# Patient Record
Sex: Female | Born: 1965 | Race: White | Hispanic: No | Marital: Married | Smoking: Never smoker
Health system: Southern US, Community
[De-identification: ages and names within clinical notes are randomized; demographics above are authoritative.]

## PROBLEM LIST (undated history)

## (undated) DIAGNOSIS — K219 Gastro-esophageal reflux disease without esophagitis: Secondary | ICD-10-CM

## (undated) DIAGNOSIS — E739 Lactose intolerance, unspecified: Secondary | ICD-10-CM

## (undated) DIAGNOSIS — K589 Irritable bowel syndrome without diarrhea: Secondary | ICD-10-CM

## (undated) DIAGNOSIS — M1990 Unspecified osteoarthritis, unspecified site: Secondary | ICD-10-CM

## (undated) DIAGNOSIS — M2550 Pain in unspecified joint: Secondary | ICD-10-CM

## (undated) DIAGNOSIS — E785 Hyperlipidemia, unspecified: Secondary | ICD-10-CM

## (undated) DIAGNOSIS — K602 Anal fissure, unspecified: Secondary | ICD-10-CM

## (undated) DIAGNOSIS — M549 Dorsalgia, unspecified: Secondary | ICD-10-CM

## (undated) DIAGNOSIS — F419 Anxiety disorder, unspecified: Secondary | ICD-10-CM

## (undated) DIAGNOSIS — E559 Vitamin D deficiency, unspecified: Secondary | ICD-10-CM

## (undated) DIAGNOSIS — K5790 Diverticulosis of intestine, part unspecified, without perforation or abscess without bleeding: Secondary | ICD-10-CM

## (undated) DIAGNOSIS — K635 Polyp of colon: Secondary | ICD-10-CM

## (undated) DIAGNOSIS — J45909 Unspecified asthma, uncomplicated: Secondary | ICD-10-CM

## (undated) DIAGNOSIS — R600 Localized edema: Secondary | ICD-10-CM

## (undated) HISTORY — DX: Anxiety disorder, unspecified: F41.9

## (undated) HISTORY — DX: Unspecified osteoarthritis, unspecified site: M19.90

## (undated) HISTORY — DX: Pain in unspecified joint: M25.50

## (undated) HISTORY — DX: Irritable bowel syndrome, unspecified: K58.9

## (undated) HISTORY — DX: Lactose intolerance, unspecified: E73.9

## (undated) HISTORY — DX: Polyp of colon: K63.5

## (undated) HISTORY — DX: Localized edema: R60.0

## (undated) HISTORY — PX: ABDOMINAL HYSTERECTOMY: SUR658

## (undated) HISTORY — DX: Dorsalgia, unspecified: M54.9

## (undated) HISTORY — DX: Hyperlipidemia, unspecified: E78.5

## (undated) HISTORY — DX: Anal fissure, unspecified: K60.2

## (undated) HISTORY — DX: Vitamin D deficiency, unspecified: E55.9

## (undated) HISTORY — DX: Diverticulosis of intestine, part unspecified, without perforation or abscess without bleeding: K57.90

## (undated) HISTORY — DX: Unspecified asthma, uncomplicated: J45.909

## (undated) HISTORY — DX: Gastro-esophageal reflux disease without esophagitis: K21.9

---
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
## 1986-09-06 HISTORY — PX: LAPAROSCOPY: SHX197

## 1999-03-16 ENCOUNTER — Other Ambulatory Visit: Admission: RE | Admit: 1999-03-16 | Discharge: 1999-03-16 | Payer: Self-pay | Admitting: Obstetrics and Gynecology

## 1999-04-19 ENCOUNTER — Emergency Department (HOSPITAL_COMMUNITY): Admission: EM | Admit: 1999-04-19 | Discharge: 1999-04-19 | Payer: Self-pay | Admitting: *Deleted

## 2000-04-27 ENCOUNTER — Other Ambulatory Visit: Admission: RE | Admit: 2000-04-27 | Discharge: 2000-04-27 | Payer: Self-pay | Admitting: Obstetrics and Gynecology

## 2001-05-05 ENCOUNTER — Other Ambulatory Visit: Admission: RE | Admit: 2001-05-05 | Discharge: 2001-05-05 | Payer: Self-pay | Admitting: Obstetrics and Gynecology

## 2002-02-15 ENCOUNTER — Encounter (INDEPENDENT_AMBULATORY_CARE_PROVIDER_SITE_OTHER): Payer: Self-pay | Admitting: *Deleted

## 2002-02-15 ENCOUNTER — Inpatient Hospital Stay (HOSPITAL_COMMUNITY): Admission: AD | Admit: 2002-02-15 | Discharge: 2002-02-17 | Payer: Self-pay | Admitting: Obstetrics and Gynecology

## 2002-03-28 ENCOUNTER — Other Ambulatory Visit: Admission: RE | Admit: 2002-03-28 | Discharge: 2002-03-28 | Payer: Self-pay | Admitting: Obstetrics and Gynecology

## 2003-04-01 ENCOUNTER — Other Ambulatory Visit: Admission: RE | Admit: 2003-04-01 | Discharge: 2003-04-01 | Payer: Self-pay | Admitting: Obstetrics and Gynecology

## 2004-04-10 ENCOUNTER — Other Ambulatory Visit: Admission: RE | Admit: 2004-04-10 | Discharge: 2004-04-10 | Payer: Self-pay | Admitting: Obstetrics and Gynecology

## 2004-10-20 ENCOUNTER — Encounter (INDEPENDENT_AMBULATORY_CARE_PROVIDER_SITE_OTHER): Payer: Self-pay | Admitting: *Deleted

## 2004-10-20 ENCOUNTER — Ambulatory Visit (HOSPITAL_COMMUNITY): Admission: RE | Admit: 2004-10-20 | Discharge: 2004-10-20 | Payer: Self-pay | Admitting: Gastroenterology

## 2005-02-23 ENCOUNTER — Encounter: Admission: RE | Admit: 2005-02-23 | Discharge: 2005-05-24 | Payer: Self-pay | Admitting: Family Medicine

## 2005-04-22 ENCOUNTER — Other Ambulatory Visit: Admission: RE | Admit: 2005-04-22 | Discharge: 2005-04-22 | Payer: Self-pay | Admitting: Obstetrics and Gynecology

## 2005-09-06 HISTORY — PX: ABDOMINAL HYSTERECTOMY: SUR658

## 2005-11-02 ENCOUNTER — Encounter: Admission: RE | Admit: 2005-11-02 | Discharge: 2005-11-02 | Payer: Self-pay | Admitting: Gastroenterology

## 2006-05-13 ENCOUNTER — Encounter: Admission: RE | Admit: 2006-05-13 | Discharge: 2006-05-13 | Payer: Self-pay | Admitting: Gastroenterology

## 2006-07-21 ENCOUNTER — Encounter (INDEPENDENT_AMBULATORY_CARE_PROVIDER_SITE_OTHER): Payer: Self-pay | Admitting: Specialist

## 2006-07-21 ENCOUNTER — Ambulatory Visit (HOSPITAL_COMMUNITY): Admission: RE | Admit: 2006-07-21 | Discharge: 2006-07-22 | Payer: Self-pay | Admitting: Obstetrics and Gynecology

## 2007-06-15 ENCOUNTER — Encounter: Admission: RE | Admit: 2007-06-15 | Discharge: 2007-09-13 | Payer: Self-pay | Admitting: Family Medicine

## 2008-07-10 ENCOUNTER — Encounter: Admission: RE | Admit: 2008-07-10 | Discharge: 2008-07-10 | Payer: Self-pay | Admitting: Obstetrics and Gynecology

## 2008-11-27 ENCOUNTER — Encounter: Admission: RE | Admit: 2008-11-27 | Discharge: 2008-11-27 | Payer: Self-pay | Admitting: Family Medicine

## 2009-06-09 ENCOUNTER — Other Ambulatory Visit: Admission: RE | Admit: 2009-06-09 | Discharge: 2009-06-09 | Payer: Self-pay | Admitting: Obstetrics and Gynecology

## 2009-07-29 ENCOUNTER — Encounter: Admission: RE | Admit: 2009-07-29 | Discharge: 2009-07-29 | Payer: Self-pay | Admitting: Obstetrics and Gynecology

## 2010-07-07 ENCOUNTER — Encounter: Admission: RE | Admit: 2010-07-07 | Discharge: 2010-07-07 | Payer: Self-pay | Admitting: Obstetrics and Gynecology

## 2011-01-22 NOTE — Discharge Summary (Signed)
NAME:  Kim Clements, Kim Clements           ACCOUNT NO.:  [id number]   MEDICAL RECORD NO.:  [id number]          PATIENT TYPE:  OIB   LOCATION:  9317                          FACILITY:  [facility]   PHYSICIAN:  Guy Sandifer. Henderson Cloud, M.D. DATE OF BIRTH:  06-01-1966   DATE OF ADMISSION:  07/21/2006  DATE OF DISCHARGE:  07/22/2006                                 DISCHARGE SUMMARY   ADMITTING DIAGNOSES:  1. Endometriosis.  2. Menorrhagia.   DISCHARGE DIAGNOSES:  1. Endometriosis.  2. Menorrhagia.   PROCEDURE:  On July 21, 2006, laparoscopically-assisted vaginal  hysterectomy with bilateral salpingo-oophorectomy.   REASON FOR ADMISSION:  This patient is a 45 year old, married, white female,  G1, P1 with increasingly heavy and painful menses.  After discussing the  options, she is admitted for laparoscopically-assisted vaginal hysterectomy  with bilateral salpingo-oophorectomy.   HOSPITAL COURSE:  The patient undergoes the above procedure with an  estimated blood loss of 150 mL.  On the evening of surgery, she has good  pain relief.  She is not yet passing flatus.  Vital signs are stable.  She  is afebrile with a clear urine output.  On the day of discharge, she is  passing flatus, tolerating a regular diet.  Hemoglobin is 11.4, white count  is 10.1 and pathology is pending.  Foley catheter is out.  She has not yet  passed urine at the time of dictation.  She will void prior to discharge.   CONDITION ON DISCHARGE:  Good.   DIET:  Regular as tolerated.   ACTIVITY:  No lifting.  No operation of automobiles.  No vaginal entry.   SPECIAL INSTRUCTIONS:  She is to call the office for problems including, but  not limited to temperature 101 degrees, heavy vaginal bleeding, persistent  nausea, vomiting or increasing pain.   DISCHARGE MEDICATIONS:  1. Percocet 5/325 mg, #40, one to two p.o. q.6 h. p.r.n.  2. Ibuprofen 600 mg q.6 h. p.r.n.  3. Multivitamin daily.  4. She will resume her preop  medications with the exception of no birth      control pills.   FOLLOW UP:  Followup is in the office in 2 weeks.      Guy Sandifer Henderson Cloud, M.D.  Electronically Signed     JET/MEDQ  D:  07/22/2006  T:  07/22/2006  Job:  [id number]

## 2011-01-22 NOTE — H&P (Signed)
NAME:  Kim Clements, MAULT           ACCOUNT NO.:  [id number]   MEDICAL RECORD NO.:  [id number]          PATIENT TYPE:  AMB   LOCATION:  SDC                           [facility]ILITY:  [facility]   PHYSICIAN:  Guy Sandifer. Henderson Cloud, M.D. DATE OF BIRTH:  21-Jan-1966   DATE OF ADMISSION:  07/21/2006  DATE OF DISCHARGE:                                HISTORY & PHYSICAL   CHIEF COMPLAINT:  Heavy painful menses.   HISTORY OF PRESENT ILLNESS:  This patient is a 45 year old married white  female, G1 P1, who has increasingly heavy and painful menses.  She has  increasing clotting.  She has known irritable bowel syndrome which gets  markedly worse before each menstrual cycle.  After careful discussion of the  options, she is being admitted for laparoscopically assisted vaginal  hysterectomy with bilateral salpingo-oophorectomy.  The risks and  complications of surgery have been carefully discussed preoperatively.  Issues of menopause with risks and benefits of treatment have also been  carefully reviewed.   PAST MEDICAL HISTORY:  1. Irritable bowel syndrome.  2. Benign hematuria with negative urologic workup.  3. Diverticulosis with history of diverticulitis.  4. History of esophageal reflux.  5. Hiatal hernia.  6. History of migraine headaches.   PAST SURGICAL HISTORY:  1. Fractured right foot, 2000.  2. Tonsillectomy, 1970.  3. Laparoscopy, 1988.   MEDICATIONS:  1. Birth control pill daily.  2. Librax1.  Fractured right foot, 2000.  3. Tonsillectomy, 1970.  4. Laparoscopy, 1988.   MEDICATIONS:  1. Birth control pill daily.  2. Librax once a day.  3. Colestid 1 gram a day.  4. Prilosec one a day.  5. Clarinex.  6. Lexapro 20 mg a day.  7. Allergy shots twice a week.   ALLERGIES:  1. PENICILLIN.  2. SULFA.  3. ERYTHROMYCIN, ALL LEADING TO HIVES.   FAMILY HISTORY:  Heart disease in mother, maternal grandmother, paternal  grandfather, maternal grandfather.  Rheumatoid arthritis in mother.   Breast  cancer paternal grandmother.  CVA in paternal grandfather, paternal  grandmother.  Headache in mother and maternal grandmother.  Hemoglobinopathy  in maternal grandmother.  Type 2 diabetes maternal grandmother.  Thyroid  dysfunction maternal grandmother.   OBSTETRIC HISTORY:  Vaginal delivery x1.   SOCIAL HISTORY:  Denies tobacco, alcohol, or drug abuse.   REVIEW OF SYSTEMS:  NEUROLOGIC:  Denies headache.  CARDIAC:  Denies chest  pain.  PULMONARY:  Denies shortness of breath.  GI:  Denies recent changes  in bowel habits.   PHYSICAL EXAMINATION:  VITAL SIGNS:  Height 5 feet 5.5 inches, weight 141.5  pounds, blood pressure 110/70.  HEENT:  Without thyromegaly.  LUNGS:  Clear to auscultation.  HEART:  Regular rate and rhythm.  BACK:  Without CVA tenderness.  BREASTS:  Without masses, tracks. or discharge.  ABDOMEN:  Soft, nontender, without masses.  PELVIC:  Vulva, vagina, cervix without lesion.  Uterus retroverted, mildly  tender, mobile, adnexa nontender without masses.  EXTREMITIES:  Grossly within normal limits.  NEUROLOGIC:  Grossly within normal limits.   ASSESSMENT:  History of endometriosis with increasingly painful heavy  menses.  PLAN:  Laparoscopically-assisted vaginal hysterectomy with bilateral  salpingo-oophorectomy.      Guy Sandifer Henderson Cloud, M.D.  Electronically Signed     JET/MEDQ  D:  07/20/2006  T:  07/20/2006  Job:  [id number]

## 2011-01-22 NOTE — Discharge Summary (Signed)
NAME:  FAIZA, BANSAL           ACCOUNT NO.:  [id number]   MEDICAL RECORD NO.:  [id number]          PATIENT TYPE:  OIB   LOCATION:  9317                          [facility]ILITY[facility]   PHYSICIAN:  Guy Sandifer. Henderson Cloud, M.D. DATE OF BIRTH:  Mar 17, 1966   DATE OF ADMISSION:  07/21/2006  DATE OF DISCHARGE:                                 DISCHARGE SUMMARY   Audio too short to transcribe (less than 5 seconds)      Guy Sandifer. Henderson Cloud, M.D.     JET/MEDQ  D:  07/22/2006  T:  07/22/2006  Job:  [id number]

## 2011-01-22 NOTE — Op Note (Signed)
NAME:  Kim Clements, Kim Clements           ACCOUNT NO.:  [id number]   MEDICAL RECORD NO.:  [id number]          PATIENT TYPE:  AMB   LOCATION:  SDC                           [facility]   PHYSICIAN:  Guy Sandifer. Henderson Cloud, M.D. DATE OF BIRTH:  29-Mar-1966   DATE OF PROCEDURE:  07/21/2006  DATE OF DISCHARGE:                                 OPERATIVE REPORT   PREOPERATIVE DIAGNOSES:  1. Endometriosis.  2. Menorrhagia.   POSTOPERATIVE DIAGNOSES:  1. Endometriosis.  2. Menorrhagia.   PROCEDURE:  Laparoscopically assisted vaginal hysterectomy with bilateral  salpingo-oophorectomy.   SURGEON:  Harold Hedge, M.D.   ASSISTANT:  Zelphia Cairo, M.D.   ANESTHESIA:  General with endotracheal intubation.   SPECIMEN:  Uterus, bilateral tubes and ovaries to Pathology.   ESTIMATED BLOOD LOSS:  150 mL.   INDICATIONS AND CONSENT:  This patient is a 45 year old married white  female, G1, P1, with known endometriosis.  She has increasingly heavy and  painful menses.  Details are dictated in the history and physical.  Laparoscopically assisted vaginal hysterectomy with bilateral salpingo-  oophorectomy is discussed preoperatively.  Potential risks and complications  are discussed preoperatively including, but limited to, infection, bowel,  bladder or ureteral damage, bleeding requiring transfusion of blood products  with possible transfusion reaction, HIV and hepatitis acquisition, DVT, PE,  pneumonia, laparotomy, fistula formation and postoperative dyspareunia.  Issues of the menopause, risks and benefits of possible treatment of  menopausal symptoms were also reviewed preoperatively.  All questions were  answered and consent signed and on the chart.   FINDINGS:  Upper abdomen is grossly normal.  Uterus is normal in size and  contour.  There are normal bilateral tubes and ovaries.   PROCEDURE:  The patient is taken to operating room where she is identified,  placed dorsal supine position and  general anesthesia is induced via  endotracheal intubation.  She is then placed in the dorsal lithotomy  position, where she is prepped abdominally and vaginally and bladder  straight-catheterized.  Hulka tenaculum is placed in the uterus as a  manipulator and she is draped in a sterile fashion.  The infraumbilical and  suprapubic areas are infiltrated with 0.5% plain Marcaine in the midline.  A  small infraumbilical incision is made.  A disposable Veress needle is placed  on the first attempt without difficulty.  Normal syringe and drop test are  noted.  Two liters of gas are insufflated under low pressure with good  tympany in the right upper quadrant.  Veress needle is removed and a 10/11  bladeless XL disposable trocar sleeve is placed under direct visualization  using the diagnostic laparoscope.  After placement, the operative  laparoscope is placed.  A small suprapubic incision is made and a 5-mm  disposable bladeless XL trocar sleeve is placed under direct visualization  without difficulty.  The above findings are noted.  Then using the Gyrus  bipolar cautery cutting instrument, the right infundibulopelvic ligament is  taken down, which is carried across the mesosalpinx, across the round  ligament, down to the level of the vesicouterine peritoneum.  A similar  procedure  is carried out on the left side as well.  This is done well clear  of the ureters bilaterally.  The vesicouterine peritoneum is taken down  cephalolaterally.  Good hemostasis is noted all around.  Suprapubic trocar  sleeve is removed, pneumoperitoneum is reduced, instruments are removed and  attention is turned to the vagina.  Posterior cul-de-sac is entered sharply.  Cervix was circumscribed with a cautery.  Mucosa is then entered sharply and  bluntly.  Then using the Gyrus bipolar cautery, the uterosacral ligaments  are taken down followed by the bladder pillars, cardinal ligaments and the  uterine vessels  bilaterally.  Fundus with tubes and ovaries is delivered  posteriorly.  The remaining pedicles are taken down and the specimen is  delivered.  Uterosacral ligaments are plicated to the vaginal cuff  bilaterally with separate sutures of 0 Monocryl.  All suture will be 0  Monocryl was otherwise designated.  Sutures are placed at the 3 and 9  o'clock position to assure hemostasis as well.  Uterosacral ligaments are  plicated in the midline with a 3rd suture.  Cuff is closed with figure-of-  eights.  Foley catheter is placed in the bladder and clear urine is noted.  Attention is returned to the abdomen.  Careful inspection and copious  irrigation reveal excellent hemostasis.  Inspection under reduced  pneumoperitoneum confirms good hemostasis.  Instruments are removed,  pneumoperitoneum is reduced and the umbilical trocar sleeve is also removed.  All counts are correct.  The patient is awakened and taken to recovery room  in stable condition.      Guy Sandifer Henderson Cloud, M.D.  Electronically Signed     JET/MEDQ  D:  07/21/2006  T:  07/21/2006  Job:  [id number]

## 2011-01-22 NOTE — Op Note (Signed)
NAME:  RYNN, MARKIEWICZ           ACCOUNT NO.:  [id number]   MEDICAL RECORD NO.:  [id number]          PATIENT TYPE:  AMB   LOCATION:  ENDO                         FACILITY:  [facility]   PHYSICIAN:  Petra Kuba, M.D.    DATE OF BIRTH:  1965-10-13   DATE OF PROCEDURE:  10/20/2004  DATE OF DISCHARGE:                                 OPERATIVE REPORT   PROCEDURE:  Colonoscopy and biopsy.   INDICATIONS:  The patient with diarrhea.  Consent was signed after risks,  benefits, methods, and options were thoroughly discussed in the office.   PREMEDICATIONS:  Demerol 90 mg, Versed 9 mg.   DESCRIPTION OF PROCEDURE:  Rectal inspection pertinent for external  hemorrhoids, small.  Digital exam was negative.  Video pediatric adjustable  colonoscope was inserted and easily advanced around the colon to the cecum.  This did require some abdominal pressure but no position changes.  No  abnormality was seen on insertion.  The cecum was identified by the  appendiceal orifice and the ileocecal valve.  The scope was inserted a short  ways into the terminal ileum which was normal.  Photo documentation was  obtained.  The scope was slowly withdrawn.  A few random terminal ileum  biopsies were obtained and put in the first container.  The scope was slowly  withdrawn. Prep in the colon was adequate.  There was some liquid stool that  required washing and suctioning.  On slow withdrawal through the colon,  random biopsies were obtained and put in the second container.  There were a  few tiny, little bumps in the transverse.  I doubt they were polyps, but  they were cold biopsied as well as and put in the same container.  The scope  was slowly withdrawn. There was a rare left-sided diverticula but no other  abnormalities.  As we slowly withdrew back to the rectum, anal area was  pulled through retroflexed and revealed some small hemorrhoids.  The scope  was straightened and readvanced a short ways up the left side  of the colon.  Air was suctioned and the scope removed.  The patient tolerated the  procedure well.  There were no obvious immediate complications.   ENDOSCOPIC DIAGNOSES:  1.  Small internal and external hemorrhoids.  2.  Rare left-sided diverticula.  3.  Two tiny bumps in the transverse, doubt polyps, status post biopsy.  4.  Otherwise within normal limits to the terminal ileum, status post random      biopsies throughout.   PLAN:  Await pathology to rule out any microscopic abnormalities.  If  symptoms continue, happy to see back or consider upper GI/small bowel follow-  through.  Trial of irritable bowel medicines as needed.      MEM/MEDQ  D:  10/20/2004  T:  10/20/2004  Job:  [id number]   cc:   Chales Salmon. Abigail Miyamoto, M.D.  [address]  [place]  [place] [address]  Fax: [phone]

## 2011-07-21 ENCOUNTER — Other Ambulatory Visit: Payer: Self-pay | Admitting: Obstetrics and Gynecology

## 2011-07-21 DIAGNOSIS — Z1231 Encounter for screening mammogram for malignant neoplasm of breast: Secondary | ICD-10-CM

## 2011-08-10 ENCOUNTER — Ambulatory Visit
Admission: RE | Admit: 2011-08-10 | Discharge: 2011-08-10 | Disposition: A | Discharge status: Home / Self Care | Payer: Managed Care, Other (non HMO) | Source: Ambulatory Visit | Attending: Obstetrics and Gynecology | Admitting: Obstetrics and Gynecology

## 2011-08-10 DIAGNOSIS — Z1231 Encounter for screening mammogram for malignant neoplasm of breast: Secondary | ICD-10-CM

## 2012-07-27 ENCOUNTER — Other Ambulatory Visit: Payer: Self-pay | Admitting: Obstetrics and Gynecology

## 2012-07-27 DIAGNOSIS — Z1231 Encounter for screening mammogram for malignant neoplasm of breast: Secondary | ICD-10-CM

## 2012-09-07 ENCOUNTER — Ambulatory Visit: Payer: Managed Care, Other (non HMO)

## 2012-10-02 ENCOUNTER — Ambulatory Visit: Payer: Managed Care, Other (non HMO)

## 2012-10-19 ENCOUNTER — Ambulatory Visit: Payer: Managed Care, Other (non HMO)

## 2012-11-07 ENCOUNTER — Ambulatory Visit: Payer: Managed Care, Other (non HMO)

## 2012-11-13 ENCOUNTER — Other Ambulatory Visit: Payer: Self-pay

## 2012-12-06 ENCOUNTER — Ambulatory Visit
Admission: RE | Admit: 2012-12-06 | Discharge: 2012-12-06 | Disposition: A | Discharge status: Home / Self Care | Payer: 59 | Source: Ambulatory Visit | Attending: Obstetrics and Gynecology | Admitting: Obstetrics and Gynecology

## 2012-12-06 DIAGNOSIS — Z1231 Encounter for screening mammogram for malignant neoplasm of breast: Secondary | ICD-10-CM

## 2013-01-12 ENCOUNTER — Ambulatory Visit
Admission: RE | Admit: 2013-01-12 | Discharge: 2013-01-12 | Disposition: A | Discharge status: Home / Self Care | Payer: 59 | Source: Ambulatory Visit | Attending: Allergy and Immunology | Admitting: Allergy and Immunology

## 2013-01-12 ENCOUNTER — Other Ambulatory Visit: Payer: Self-pay | Admitting: Allergy and Immunology

## 2013-01-12 DIAGNOSIS — R05 Cough: Secondary | ICD-10-CM

## 2013-01-12 DIAGNOSIS — R059 Cough, unspecified: Secondary | ICD-10-CM

## 2013-12-03 ENCOUNTER — Other Ambulatory Visit: Payer: Self-pay

## 2013-12-03 DIAGNOSIS — Z1231 Encounter for screening mammogram for malignant neoplasm of breast: Secondary | ICD-10-CM

## 2013-12-13 ENCOUNTER — Ambulatory Visit
Admission: RE | Admit: 2013-12-13 | Discharge: 2013-12-13 | Disposition: A | Discharge status: Home / Self Care | Payer: 59 | Source: Ambulatory Visit

## 2013-12-13 DIAGNOSIS — Z1231 Encounter for screening mammogram for malignant neoplasm of breast: Secondary | ICD-10-CM

## 2015-04-03 ENCOUNTER — Other Ambulatory Visit: Payer: Self-pay | Admitting: Obstetrics and Gynecology

## 2015-04-03 LAB — HM PAP SMEAR: HM PAP: NORMAL

## 2015-04-03 LAB — HM MAMMOGRAPHY

## 2015-04-04 LAB — CYTOLOGY - PAP

## 2015-06-11 ENCOUNTER — Telehealth: Payer: Self-pay | Admitting: Gastroenterology

## 2015-06-11 NOTE — Telephone Encounter (Signed)
Received records from [facility] and placed on Dr. Elana Alm desk. Patient is requesting a female provider.

## 2015-06-20 ENCOUNTER — Encounter: Payer: Self-pay | Admitting: Gastroenterology

## 2015-06-20 NOTE — Telephone Encounter (Signed)
Dr. Lavon PaganiniNandigam reviewed records and approved. Informed patient that we can schedule colonoscopy but patient is requesting OV first. Patient states that she will callback to schedule.

## 2015-08-22 ENCOUNTER — Encounter: Payer: Self-pay | Admitting: Gastroenterology

## 2015-08-22 ENCOUNTER — Ambulatory Visit (INDEPENDENT_AMBULATORY_CARE_PROVIDER_SITE_OTHER): Payer: 59 | Admitting: Gastroenterology

## 2015-08-22 VITALS — BP 90/68 | HR 88 | Ht 61.5 in | Wt 171.5 lb

## 2015-08-22 DIAGNOSIS — K589 Irritable bowel syndrome without diarrhea: Secondary | ICD-10-CM | POA: Diagnosis not present

## 2015-08-22 DIAGNOSIS — R197 Diarrhea, unspecified: Secondary | ICD-10-CM | POA: Diagnosis not present

## 2015-08-22 MED ORDER — ELUXADOLINE 100 MG PO TABS: 100.0000 mg | ORAL_TABLET | Freq: Two times a day (BID) | ORAL | Status: DC

## 2015-08-22 NOTE — Patient Instructions (Signed)
Please call the office to schedule a pre-visit colonoscopy appointment. [phone]  We have sent the following medications to your pharmacy for you to pick up at your convenience: Viberzi 100 mg one by mouth twice a day.

## 2015-08-22 NOTE — Progress Notes (Signed)
Kim RidgeCatherine F Clements    [id number]    20-Nov-1965  Primary Care Physician:MEYERS, Jeannett SeniorSTEPHEN, MD  Referring Physician: No referring provider defined for this encounter.  Chief complaint: IBS-diarrhea  HPI: 49 year old female with history of irritable bowel syndrome predominant diarrhea diagnosed about 11 years ago, was followed by Dr. Ewing SchleinMagod, here to establish care. Patient said she has tried low Fodmap diet in the past, with no improvement in symptoms. Her symptoms are currently maintained with Zoloft, BuSpar, Colestid, Pepto-Bismol, and when necessary Imodium. She continues to have intermittent episodes and relates them to her stress more than dietary changes. Denies any nausea, vomiting, melena or bright red blood per rectum. She has had 2 colonoscopies and upper endoscopy unrevealing for any significant pathology. She also had abdominal ultrasound and CT abdomen and pelvis with no significant pathology as well.      Outpatient Encounter Prescriptions as of 08/22/2015  Medication Sig  . AMBULATORY NON FORMULARY MEDICATION Allergy Shots 2 shots per week  . amitriptyline (ELAVIL) 10 MG tablet Take 10 mg by mouth at bedtime.  . bismuth subsalicylate (PEPTO BISMOL) 262 MG/15ML suspension Take 30 mLs by mouth as needed.  . busPIRone (BUSPAR) 15 MG tablet Take 15 mg by mouth 2 (two) times daily.  . clidinium-chlordiazePOXIDE (LIBRAX) 5-2.5 MG capsule Take 1 capsule by mouth 4 (four) times daily -  before meals and at bedtime. Pt to titrate as needed  . colestipol (COLESTID) 1 G tablet Take 1-2 g by mouth daily.  . fexofenadine (ALLEGRA) 180 MG tablet Take 180 mg by mouth daily.  . Loperamide HCl (IMODIUM A-D PO) Take by mouth as needed.  . sertraline (ZOLOFT) 50 MG tablet Take 50 mg by mouth daily.  . Simethicone (GAS-X PO) Take by mouth as needed.   No facility-administered encounter medications on file as of 08/22/2015.    Allergies as of 08/22/2015 - Review Complete  08/22/2015  Allergen Reaction Noted  . Erythromycin  08/22/2015  . Penicillins  08/22/2015  . Sulfa antibiotics  08/22/2015    Past Medical History  Diagnosis Date  . Anal fissure   . Anxiety   . Arthritis   . Asthma   . Colon polyps   . Diverticulosis   . GERD (gastroesophageal reflux disease)   . HLD (hyperlipidemia)   . IBS (irritable bowel syndrome)     Past Surgical History  Procedure Laterality Date  . Abdominal hysterectomy    . Tonsillectomy  1971  . Laparoscopy  1988    with Laser for endometriosis    Family History  Problem Relation Age of Onset  . Breast cancer Paternal Grandmother     all of paternal females  . Breast cancer Paternal Aunt   . Prostate cancer Paternal Grandfather   . Colon polyps Mother   . Colon polyps Father   . Heart disease Mother   . Heart disease Maternal Grandfather   . Heart disease Maternal Grandmother   . Heart disease Paternal Grandfather   . Stroke Father   . Rheum arthritis Mother   . Osteoarthritis Mother     Social History   Social History  . Marital Status: Married    Spouse Name: N/A  . Number of Children: 1  . 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 Smoking status: Never Smoker   . Smokeless tobacco: Never Used  .  Alcohol Use: No  . Drug Use: No  . Sexual Activity: Not on file   Other Topics Concern  . Not on file   Social History Narrative  . No narrative on file      Review of systems: Review of Systems  Constitutional: Negative for fever and chills.  HENT: Negative.   Eyes: Negative for blurred vision.  Respiratory: Negative for cough, shortness of breath and wheezing.   Cardiovascular: Negative for chest pain and palpitations.  Gastrointestinal: as per HPI Genitourinary: Negative for dysuria, urgency, frequency and hematuria.  Musculoskeletal: Negative for myalgias, back pain and joint pain.  Skin: Negative for itching and rash.   Neurological: Negative for dizziness, tremors, focal weakness, seizures and loss of consciousness.  Endo/Heme/Allergies: Negative for environmental allergies.  Psychiatric/Behavioral: Negative for depression, suicidal ideas and hallucinations.  All other systems reviewed and are negative.   Physical Exam: Filed Vitals:   08/22/15 1012  BP: 90/68  Pulse: 88   Gen:      No acute distress HEENT:  EOMI, sclera anicteric Neck:     No masses; no thyromegaly Lungs:    Clear to auscultation bilaterally; normal respiratory effort CV:         Regular rate and rhythm; no murmurs Abd:      + bowel sounds; soft, non-tender; no palpable masses, no distension Ext:    No edema; adequate peripheral perfusion Skin:      Warm and dry; no rash Neuro: alert and oriented x 3 Psych: normal mood and affect  Data Reviewed:  Reviewed her chart and prior GI workup as per history of present illness   Assessment and Plan/Recommendations:  49 year old female with history of irritable bowel syndrome predominant diarrhea is here to establish care Patient's symptoms are currently stable with only infrequent episodes We'll do a trial of Viberzi 100 mg twice daily Continue Colestid, Zoloft, Buspar and PRN Imodium Patient is not interested in doing a trial of low fodmap diet again Due for colonoscopy in 2017 Return in 3-6 months  K. Scherry Ran , MD [phone] Mon-Fri 8a-5p [phone] after 5p, weekends, holidays

## 2015-10-29 ENCOUNTER — Encounter: Payer: Self-pay | Admitting: Gastroenterology

## 2016-01-14 ENCOUNTER — Other Ambulatory Visit: Payer: Self-pay | Admitting: Gastroenterology

## 2016-01-15 ENCOUNTER — Other Ambulatory Visit: Payer: Self-pay | Admitting: Gastroenterology

## 2016-01-16 ENCOUNTER — Other Ambulatory Visit: Payer: Self-pay | Admitting: Gastroenterology

## 2016-02-05 ENCOUNTER — Ambulatory Visit (AMBULATORY_SURGERY_CENTER): Payer: Self-pay

## 2016-02-05 VITALS — Ht 61.0 in | Wt 178.0 lb

## 2016-02-05 DIAGNOSIS — Z860100 Personal history of colon polyps, unspecified: Secondary | ICD-10-CM

## 2016-02-05 MED ORDER — SUPREP BOWEL PREP KIT 17.5-3.13-1.6 GM/177ML PO SOLN: 1.0000 | Freq: Once | ORAL | Status: DC

## 2016-02-05 NOTE — Progress Notes (Signed)
No allergies to eggs or soy No past problems with anesthesia EXCEPT EASILY OVERSEDATED WITH GENERAL ANESTHESIA AND CONS SEDATION No diet meds No home oxygen  Has email and internet; declined emmi

## 2016-02-10 ENCOUNTER — Telehealth: Payer: Self-pay | Admitting: Gastroenterology

## 2016-02-10 NOTE — Telephone Encounter (Signed)
Patient also has questions regarding prep.

## 2016-02-10 NOTE — Telephone Encounter (Signed)
Pt states she spoke with another PV nurse  Hilda LiasMarie PV

## 2016-02-10 NOTE — Telephone Encounter (Signed)
Spoke with pt and answered questions regarding prep and her medications.She will call back if she has any further questions.

## 2016-02-11 ENCOUNTER — Telehealth: Payer: Self-pay | Admitting: Gastroenterology

## 2016-02-11 NOTE — Telephone Encounter (Signed)
I recommended that pt use something like Desitin or Vaseline instead of Cortaid.

## 2016-02-12 ENCOUNTER — Ambulatory Visit (AMBULATORY_SURGERY_CENTER): Payer: 59 | Admitting: Gastroenterology

## 2016-02-12 ENCOUNTER — Encounter: Payer: Self-pay | Admitting: Gastroenterology

## 2016-02-12 VITALS — BP 145/82 | HR 83 | Temp 98.9°F | Resp 12 | Ht 61.0 in | Wt 178.0 lb

## 2016-02-12 DIAGNOSIS — Z86010 Personal history of colonic polyps: Secondary | ICD-10-CM | POA: Diagnosis not present

## 2016-02-12 LAB — HM COLONOSCOPY

## 2016-02-12 MED ORDER — SODIUM CHLORIDE 0.9 % IV SOLN: 500.0000 mL | INTRAVENOUS | Status: DC

## 2016-02-12 NOTE — Patient Instructions (Signed)
Impression/recommendations:  Diverticulosis (handout given) High Fiber Diet (handout given) Hemorrhoids (handout given)  YOU HAD AN ENDOSCOPIC PROCEDURE TODAY AT THE [facility]:   Refer to the procedure report that was given to you for any specific questions about what was found during the examination.  If the procedure report does not answer your questions, please call your gastroenterologist to clarify.  If you requested that your care partner not be given the details of your procedure findings, then the procedure report has been included in a sealed envelope for you to review at your convenience later.  YOU SHOULD EXPECT: Some feelings of bloating in the abdomen. Passage of more gas than usual.  Walking can help get rid of the air that was put into your GI tract during the procedure and reduce the bloating. If you had a lower endoscopy (such as a colonoscopy or flexible sigmoidoscopy) you may notice spotting of blood in your stool or on the toilet paper. If you underwent a bowel prep for your procedure, you may not have a normal bowel movement for a few days.  Please Note:  You might notice some irritation and congestion in your nose or some drainage.  This is from the oxygen used during your procedure.  There is no need for concern and it should clear up in a day or so.  SYMPTOMS TO REPORT IMMEDIATELY:   Following lower endoscopy (colonoscopy or flexible sigmoidoscopy):  Excessive amounts of blood in the stool  Significant tenderness or worsening of abdominal pains  Swelling of the abdomen that is new, acute  Fever of 100F or higher   For urgent or emergent issues, a gastroenterologist can be reached at any hour by calling [phone].   DIET: Your first meal following the procedure should be a small meal and then it is ok to progress to your normal diet. Heavy or fried foods are harder to digest and may make you feel nauseous or bloated.  Likewise, meals heavy in dairy  and vegetables can increase bloating.  Drink plenty of fluids but you should avoid alcoholic beverages for 24 hours.  ACTIVITY:  You should plan to take it easy for the rest of today and you should NOT DRIVE or use heavy machinery until tomorrow (because of the sedation medicines used during the test).    FOLLOW UP: Our staff will call the number listed on your records the next business day following your procedure to check on you and address any questions or concerns that you may have regarding the information given to you following your procedure. If we do not reach you, we will leave a message.  However, if you are feeling well and you are not experiencing any problems, there is no need to return our call.  We will assume that you have returned to your regular daily activities without incident.  If any biopsies were taken you will be contacted by phone or by letter within the next 1-3 weeks.  Please call us at [phone] if you have not heard about the biopsies in 3 weeks.    SIGNATURES/CONFIDENTIALITY: You and/or your care partner have signed paperwork which will be entered into your electronic medical record.  These signatures attest to the fact that that the information above on your After Visit Summary has been reviewed and is understood.  Full responsibility of the confidentiality of this discharge information lies with you and/or your care-partner.

## 2016-02-12 NOTE — Op Note (Signed)
[facility] Patient Name: Kim Clements Procedure Date: 02/12/2016 1:20 PM MRN: [id number] Endoscopist: Napoleon Form , MD Age: 50 Referring MD:  Date of Birth: April 23, 1966 Gender: Female Procedure:                Colonoscopy Indications:              Screening for malignant neoplasm in the colon Medicines:                Monitored Anesthesia Care Procedure:                Pre-Anesthesia Assessment:                           - Prior to the procedure, a History and Physical                            was performed, and patient medications and                            allergies were reviewed. The patient's tolerance of                            previous anesthesia was also reviewed. The risks                            and benefits of the procedure and the sedation                            options and risks were discussed with the patient.                            All questions were answered, and informed consent                            was obtained. Prior Anticoagulants: The patient has                            taken no previous anticoagulant or antiplatelet                            agents. ASA Grade Assessment: II - A patient with                            mild systemic disease. After reviewing the risks                            and benefits, the patient was deemed in                            satisfactory condition to undergo the procedure.                           After obtaining informed consent, the colonoscope  was passed under direct vision. Throughout the                            procedure, the patient's blood pressure, pulse, and                            oxygen saturations were monitored continuously. The                            Model CF-HQ190L [id number]) scope was introduced                            through the anus and advanced to the the cecum,                            identified by appendiceal  orifice and ileocecal                            valve. The colonoscopy was performed without                            difficulty. The patient tolerated the procedure                            well. The quality of the bowel preparation was                            adequate. The ileocecal valve, appendiceal orifice,                            and rectum were photographed. Scope In: 2:21:36 PM Scope Out: 2:38:14 PM Scope Withdrawal Time: 0 hours 13 minutes 59 seconds  Total Procedure Duration: 0 hours 16 minutes 38 seconds  Findings:                 The perianal and digital rectal examinations were                            normal.                           A few small and large-mouthed diverticula were                            found in the sigmoid colon.                           Non-bleeding internal hemorrhoids were found during                            retroflexion. The hemorrhoids were small.                           The exam was otherwise without abnormality. Complications:            No immediate complications. Estimated Blood  Loss:     Estimated blood loss was minimal. Impression:               - Diverticulosis in the sigmoid colon.                           - Non-bleeding internal hemorrhoids.                           - The examination was otherwise normal.                           - No specimens collected. Recommendation:           - Patient has a contact number available for                            emergencies. The signs and symptoms of potential                            delayed complications were discussed with the                            patient. Return to normal activities tomorrow.                            Written discharge instructions were provided to the                            patient.                           - Resume previous diet.                           - Continue present medications.                           - Repeat colonoscopy in 10  years for screening                            purposes.                           - Return to GI clinic PRN. [name], MD 02/12/2016 2:44:21 PM This report has been signed electronically.

## 2016-02-12 NOTE — Progress Notes (Signed)
To PACU- Awake and Alert, Report to RN

## 2016-02-13 ENCOUNTER — Telehealth: Payer: Self-pay | Admitting: *Deleted

## 2016-02-13 NOTE — Telephone Encounter (Signed)
No answer, left message to call if questions or concerns. 

## 2016-04-09 ENCOUNTER — Telehealth: Payer: Self-pay | Admitting: Gastroenterology

## 2016-04-09 MED ORDER — COLESTIPOL HCL 1 G PO TABS: 1.0000 g | ORAL_TABLET | Freq: Every day | ORAL | 3 refills | Status: DC

## 2016-04-09 MED ORDER — ELUXADOLINE 100 MG PO TABS: 1.0000 | ORAL_TABLET | Freq: Two times a day (BID) | ORAL | 3 refills | Status: DC

## 2016-04-09 MED ORDER — CILIDINIUM-CHLORDIAZEPOXIDE 2.5-5 MG PO CAPS: 1.0000 | ORAL_CAPSULE | Freq: Three times a day (TID) | ORAL | 0 refills | Status: DC

## 2016-04-09 NOTE — Telephone Encounter (Signed)
meds sent electronically.

## 2016-07-28 ENCOUNTER — Telehealth: Payer: Self-pay | Admitting: *Deleted

## 2016-07-28 MED ORDER — COLESTIPOL HCL 1 G PO TABS: 1.0000 g | ORAL_TABLET | Freq: Every day | ORAL | 3 refills | Status: DC

## 2016-07-28 NOTE — Telephone Encounter (Signed)
Fax request from pharmacy  Sent in electronically

## 2016-08-01 ENCOUNTER — Other Ambulatory Visit: Payer: Self-pay | Admitting: Gastroenterology

## 2016-08-02 NOTE — Telephone Encounter (Signed)
Is Librax ok to refill for patient

## 2016-08-04 ENCOUNTER — Telehealth: Payer: Self-pay | Admitting: Gastroenterology

## 2016-08-04 NOTE — Telephone Encounter (Signed)
Patient is wanting a refill of Librax. Is it ok to refill Thanks

## 2016-08-04 NOTE — Telephone Encounter (Signed)
Ok to refill Librax, is she taking Viberzi and is it effective?

## 2016-08-05 NOTE — Telephone Encounter (Signed)
Ok thanks 

## 2016-08-05 NOTE — Telephone Encounter (Signed)
Yes she is taking the Viberzi and it has been helping  Prescription called into pharmacy of Librax 90 day supply as previously sent in Aug

## 2016-10-06 ENCOUNTER — Telehealth: Payer: Self-pay | Admitting: Gastroenterology

## 2016-10-06 NOTE — Telephone Encounter (Signed)
Ok to send refill for Viberzi and it is ok to continue probiotics and multivitamins. Please schedule patient for follow up visit. Thanks

## 2016-10-06 NOTE — Telephone Encounter (Signed)
Dr Lavon PaganiniNandigam patient wants 90 day supply of Viberizi has not been seen since 2016. Also wants to know if she can take probiotics and vitamins with this medication  Is it ok to send her a 90 day supply

## 2016-10-07 MED ORDER — ELUXADOLINE 100 MG PO TABS: 1.0000 | ORAL_TABLET | Freq: Two times a day (BID) | ORAL | 3 refills | Status: DC

## 2016-10-07 NOTE — Addendum Note (Signed)
Addended by: Marlowe KaysSTALLINGS, Miqueas Whilden M on: 10/07/2016 01:12 PM   Modules accepted: Orders

## 2016-10-07 NOTE — Telephone Encounter (Signed)
Called patient to inform viberizi sent to pharmacy and for patient to make a follow up appointment, Explained to pt she will need a follow up in the office before any further refills She said she will call back to schedule follow up

## 2016-11-02 ENCOUNTER — Telehealth: Payer: Self-pay | Admitting: Emergency Medicine

## 2016-11-02 NOTE — Telephone Encounter (Signed)
Pre-visit attempted no answer. Voicemail left for pt to return call to office

## 2016-11-05 ENCOUNTER — Ambulatory Visit: Payer: 59 | Admitting: Family Medicine

## 2016-11-05 LAB — HM MAMMOGRAPHY

## 2016-12-06 ENCOUNTER — Telehealth: Payer: Self-pay | Admitting: Gastroenterology

## 2016-12-06 NOTE — Telephone Encounter (Signed)
Please advise patient to do GI pathogen panel to exclude infectious etiology. Maintain hydration with increased fluid intake. Continue Viberzi.

## 2016-12-06 NOTE — Telephone Encounter (Signed)
Patient has an appointment with you this month for follow up and medication check. Last seen 2016.  She calls today with complaints of 24 hours of intermittent stomach pains. She has developed diarrhea today. More than 4 stools today. She has no appetite. Afebrile and no nausea or vomiting. She admits to over eating the evening before she developed her symptoms. She calls also because she takes Viberzi. She has her gallbladder. She is eating crackers and drinking fluids. Please advise.

## 2016-12-07 NOTE — Telephone Encounter (Signed)
Spoke with the patient. She is feeling better today. No abdominal pain. Slept through the night. She has not had any loose bowel movements today. She will resume her Viberzi.Kim Clements to call back if her symptoms return.

## 2016-12-09 ENCOUNTER — Ambulatory Visit (INDEPENDENT_AMBULATORY_CARE_PROVIDER_SITE_OTHER): Payer: 59 | Admitting: Family Medicine

## 2016-12-09 ENCOUNTER — Encounter: Payer: Self-pay | Admitting: Family Medicine

## 2016-12-09 VITALS — BP 112/74 | HR 87 | Temp 98.1°F | Ht 61.5 in | Wt 176.8 lb

## 2016-12-09 DIAGNOSIS — K580 Irritable bowel syndrome with diarrhea: Secondary | ICD-10-CM | POA: Diagnosis not present

## 2016-12-09 DIAGNOSIS — F411 Generalized anxiety disorder: Secondary | ICD-10-CM | POA: Diagnosis not present

## 2016-12-09 DIAGNOSIS — F339 Major depressive disorder, recurrent, unspecified: Secondary | ICD-10-CM | POA: Diagnosis not present

## 2016-12-09 DIAGNOSIS — E559 Vitamin D deficiency, unspecified: Secondary | ICD-10-CM

## 2016-12-09 DIAGNOSIS — E669 Obesity, unspecified: Secondary | ICD-10-CM | POA: Diagnosis not present

## 2016-12-09 DIAGNOSIS — R7989 Other specified abnormal findings of blood chemistry: Secondary | ICD-10-CM

## 2016-12-09 DIAGNOSIS — E66811 Obesity, class 1: Secondary | ICD-10-CM

## 2016-12-09 DIAGNOSIS — R5383 Other fatigue: Secondary | ICD-10-CM | POA: Diagnosis not present

## 2016-12-09 DIAGNOSIS — Z13220 Encounter for screening for lipoid disorders: Secondary | ICD-10-CM | POA: Diagnosis not present

## 2016-12-09 LAB — LIPID PANEL
Cholesterol: 196 mg/dL (ref 0–200)
HDL: 51.3 mg/dL (ref >39.00)
LDL Cholesterol: 124 mg/dL — ABNORMAL HIGH (ref 0–99)
NonHDL: 144.42
Total CHOL/HDL Ratio: 4
Triglycerides: 102 mg/dL (ref 0.0–149.0)
VLDL: 20.4 mg/dL (ref 0.0–40.0)

## 2016-12-09 LAB — CBC WITH DIFFERENTIAL/PLATELET
Basophils Absolute: 0 10*3/uL (ref 0.0–0.1)
Basophils Relative: 0.4 % (ref 0.0–3.0)
Eosinophils Absolute: 0.1 10*3/uL (ref 0.0–0.7)
Eosinophils Relative: 1.4 % (ref 0.0–5.0)
HCT: 39.9 % (ref 36.0–46.0)
Hemoglobin: 13.5 g/dL (ref 12.0–15.0)
Lymphocytes Relative: 29.2 % (ref 12.0–46.0)
Lymphs Abs: 1.9 10*3/uL (ref 0.7–4.0)
MCHC: 33.9 g/dL (ref 30.0–36.0)
MCV: 88.5 fl (ref 78.0–100.0)
Monocytes Absolute: 0.4 10*3/uL (ref 0.1–1.0)
Monocytes Relative: 6.1 % (ref 3.0–12.0)
Neutro Abs: 4.1 10*3/uL (ref 1.4–7.7)
Neutrophils Relative %: 62.9 % (ref 43.0–77.0)
Platelets: 276 10*3/uL (ref 150.0–400.0)
RBC: 4.51 Mil/uL (ref 3.87–5.11)
RDW: 13.4 % (ref 11.5–15.5)
WBC: 6.5 10*3/uL (ref 4.0–10.5)

## 2016-12-09 LAB — COMPREHENSIVE METABOLIC PANEL
ALT: 29 U/L (ref 0–35)
AST: 21 U/L (ref 0–37)
Albumin: 4.3 g/dL (ref 3.5–5.2)
Alkaline Phosphatase: 66 U/L (ref 39–117)
BUN: 14 mg/dL (ref 6–23)
CO2: 29 mEq/L (ref 19–32)
Calcium: 9.4 mg/dL (ref 8.4–10.5)
Chloride: 105 mEq/L (ref 96–112)
Creatinine, Ser: 0.88 mg/dL (ref 0.40–1.20)
GFR: 71.92 mL/min (ref >60.00)
Glucose, Bld: 106 mg/dL — ABNORMAL HIGH (ref 70–99)
Potassium: 3.9 mEq/L (ref 3.5–5.1)
Sodium: 139 mEq/L (ref 135–145)
Total Bilirubin: 0.5 mg/dL (ref 0.2–1.2)
Total Protein: 6.9 g/dL (ref 6.0–8.3)

## 2016-12-09 LAB — TSH: TSH: 1.35 u[IU]/mL (ref 0.35–4.50)

## 2016-12-09 LAB — VITAMIN B12: Vitamin B-12: 443 pg/mL (ref 211–911)

## 2016-12-09 LAB — VITAMIN D 25 HYDROXY (VIT D DEFICIENCY, FRACTURES): VITD: 26 ng/mL — ABNORMAL LOW (ref 30.00–100.00)

## 2016-12-09 NOTE — Patient Instructions (Signed)
Increase your Zoloft to 100 mg over the next two weeks.

## 2016-12-09 NOTE — Progress Notes (Signed)
Kim Clements is a 51 y.o. female is here to [facility].   History of Present Illness:   Kim Clements CMA acting as scribe for Dr. Earlene Plater.  HPI:  1. GAD (generalized anxiety disorder). Patient is here for evaluation of anxiety.  She has the following anxiety symptoms: fatigue, insomnia, racing thoughts. Onset of symptoms was approximately several months ago.  Symptoms have been unchanged since that time. She denies current suicidal and homicidal ideation. Family history significant for depression.Possible organic causes contributing are: none. Risk factors: none Previous treatment includes medication BuSpar and Zoloft.   She complains of the following medication side effects: none. No SI/HI.    2. Irritable bowel syndrome with diarrhea. Followed by GI. Generally controlled using medications. Anxiety makes worse.     3. Obesity (BMI 30.0-34.9). Not exercising or watching diet. At highest adult weight.    Health Maintenance Due  Topic Date Due  . TETANUS/TDAP  08/30/1984  . MAMMOGRAM  12/14/2015   PMHx, SurgHx, SocialHx, Medications, and Allergies were reviewed in the Visit Navigator and updated as appropriate.   Past Medical History:  Diagnosis Date  . Anal fissure   . Anxiety   . Arthritis   . Asthma   . Colon polyps   . Diverticulosis   . GERD (gastroesophageal reflux disease)   . HLD (hyperlipidemia)   . IBS (irritable bowel syndrome)    Past Surgical History:  Procedure Laterality Date  . ABDOMINAL HYSTERECTOMY    . LAPAROSCOPY  1988   Laser. Indication: Endometriosis.  . TONSILLECTOMY  1971   Family History  Problem Relation Age of Onset  . Breast cancer Paternal Grandmother     all of paternal females  . Breast cancer Paternal Aunt   . Prostate cancer Paternal Grandfather   . Heart disease Paternal Grandfather   . Colon polyps Mother   . Heart disease Mother   . Rheum arthritis Mother   . Osteoarthritis Mother   . Colon polyps Father   .  Stroke Father   . Heart disease Maternal Grandfather   . Heart disease Maternal Grandmother   . Colon cancer Neg Hx    Social History  Substance Use Topics  . Smoking status: Never Smoker  . Smokeless tobacco: Never Used  . Alcohol use No   Current Medications and Allergies:   .  albuterol (PROVENTIL HFA;VENTOLIN HFA) 108 (90 Base) MCG/ACT inhaler, Inhale into the lungs every 6 (six) hours as needed for wheezing or shortness of breath., Disp: , Rfl:  .  AMBULATORY NON FORMULARY MEDICATION, Allergy Shots 2 shots per week, Disp: , Rfl:  .  amitriptyline (ELAVIL) 10 MG tablet, Take 10 mg by mouth at bedtime., Disp: , Rfl:  .  bismuth subsalicylate (PEPTO BISMOL) 262 MG/15ML suspension, Take 30 mLs by mouth as needed., Disp: , Rfl:  .  busPIRone (BUSPAR) 15 MG tablet, Take 15 mg by mouth 2 (two) times daily., Disp: , Rfl:  .  calcium carbonate (TUMS - DOSED IN MG ELEMENTAL CALCIUM) 500 MG chewable tablet, Chew 1 tablet by mouth daily., Disp: , Rfl:  .  clidinium-chlordiazePOXIDE (LIBRAX) 5-2.5 MG capsule, TAKE ONE CAPSULE BY MOUTH 4 TIMES A DAY BEFORE MEALS AND AT BEDTIME (TITRATE AS NEEDED), Disp: 360 capsule, Rfl: 0 .  colestipol (COLESTID) 1 g tablet, Take 1-2 tablets (1-2 g total) by mouth daily., Disp: 90 tablet, Rfl: 3 .  Eluxadoline (VIBERZI) 100 MG TABS, Take 1 tablet by mouth 2 (two) times daily.,  Disp: 90 tablet, Rfl: 3 .  fexofenadine (ALLEGRA) 180 MG tablet, Take 180 mg by mouth daily., Disp: , Rfl:  .  hydrOXYzine (ATARAX/VISTARIL) 25 MG tablet, Take 50 mg by mouth 3 (three) times daily as needed., Disp: , Rfl:  .  Loperamide HCl (IMODIUM A-D PO), Take by mouth as needed., Disp: , Rfl:  .  sertraline (ZOLOFT) 50 MG tablet, Take 50 mg by mouth daily., Disp: , Rfl:    Allergies  Allergen Reactions  . Erythromycin   . Penicillins   . Sulfa Antibiotics      Review of Systems:   Review of Systems  Constitutional: Positive for malaise/fatigue. Negative for chills and fever.    HENT: Negative for congestion, ear pain, sinus pain and sore throat.   Eyes: Negative for blurred vision and double vision.  Respiratory: Negative for cough, shortness of breath and wheezing.   Cardiovascular: Negative for chest pain, palpitations and leg swelling.  Gastrointestinal: Positive for abdominal pain and diarrhea.  Genitourinary: Negative for dysuria.  Musculoskeletal: Negative for back pain and neck pain.  Neurological: Negative for dizziness and headaches.  Psychiatric/Behavioral: Positive for depression. Negative for hallucinations, memory loss, substance abuse and suicidal ideas. The patient is nervous/anxious.     Vitals:   Vitals:   12/09/16 1404  BP: 112/74  Pulse: 87  Temp: 98.1 F (36.7 C)  TempSrc: Oral  SpO2: 97%  Weight: 176 lb 12.8 oz (80.2 kg)  Height: 5' 1.5" (1.562 m)     Body mass index is 32.87 kg/m.   Physical Exam:   Physical Exam  Constitutional: She is oriented to person, place, and time. She appears well-developed and well-nourished. No distress.  HENT:  Head: Normocephalic and atraumatic.  Right Ear: External ear normal.  Left Ear: External ear normal.  Nose: Nose normal.  Mouth/Throat: Oropharynx is clear and moist.  Eyes: Conjunctivae and EOM are normal. Pupils are equal, round, and reactive to light.  Neck: Normal range of motion. Neck supple. No thyromegaly present.  Cardiovascular: Normal rate, regular rhythm, normal heart sounds and intact distal pulses.   Pulmonary/Chest: Effort normal.  Abdominal: Soft.  Musculoskeletal: Normal range of motion.  Neurological: She is alert and oriented to person, place, and time.  Skin: Skin is warm.  Psychiatric: She has a normal mood and affect. Her behavior is normal.  Nursing note and vitals reviewed.  Results for orders placed or performed in visit on 12/09/16  CBC with Differential/Platelet  Result Value Ref Range   WBC 6.5 4.0 - 10.5 K/uL   RBC 4.51 3.87 - 5.11 Mil/uL    Hemoglobin 13.5 12.0 - 15.0 g/dL   HCT 96.0 45.4 - 09.8 %   MCV 88.5 78.0 - 100.0 fl   MCHC 33.9 30.0 - 36.0 g/dL   RDW 11.9 14.7 - 82.9 %   Platelets 276.0 150.0 - 400.0 K/uL   Neutrophils Relative % 62.9 43.0 - 77.0 %   Lymphocytes Relative 29.2 12.0 - 46.0 %   Monocytes Relative 6.1 3.0 - 12.0 %   Eosinophils Relative 1.4 0.0 - 5.0 %   Basophils Relative 0.4 0.0 - 3.0 %   Neutro Abs 4.1 1.4 - 7.7 K/uL   Lymphs Abs 1.9 0.7 - 4.0 K/uL   Monocytes Absolute 0.4 0.1 - 1.0 K/uL   Eosinophils Absolute 0.1 0.0 - 0.7 K/uL   Basophils Absolute 0.0 0.0 - 0.1 K/uL  Comprehensive metabolic panel  Result Value Ref Range   Sodium 139 135 -  145 mEq/L   Potassium 3.9 3.5 - 5.1 mEq/L   Chloride 105 96 - 112 mEq/L   CO2 29 19 - 32 mEq/L   Glucose, Bld 106 (H) 70 - 99 mg/dL   BUN 14 6 - 23 mg/dL   Creatinine, Ser 1.61 0.40 - 1.20 mg/dL   Total Bilirubin 0.5 0.2 - 1.2 mg/dL   Alkaline Phosphatase 66 39 - 117 U/L   AST 21 0 - 37 U/L   ALT 29 0 - 35 U/L   Total Protein 6.9 6.0 - 8.3 g/dL   Albumin 4.3 3.5 - 5.2 g/dL   Calcium 9.4 8.4 - 09.6 mg/dL   GFR 04.54 >09.81 mL/min  Lipid panel  Result Value Ref Range   Cholesterol 196 0 - 200 mg/dL   Triglycerides 191.4 0.0 - 149.0 mg/dL   HDL 78.29 >56.21 mg/dL   VLDL 30.8 0.0 - 65.7 mg/dL   LDL Cholesterol 846 (H) 0 - 99 mg/dL   Total CHOL/HDL Ratio 4    NonHDL 144.42   Vitamin B12  Result Value Ref Range   Vitamin B-12 443 211 - 911 pg/mL  VITAMIN D 25 Hydroxy (Vit-D Deficiency, Fractures)  Result Value Ref Range   VITD 26.00 (L) 30.00 - 100.00 ng/mL  TSH  Result Value Ref Range   TSH 1.35 0.35 - 4.50 uIU/mL    Assessment and Plan:    Artice was seen today for establish care.  Diagnoses and all orders for this visit:  GAD (generalized anxiety disorder) Depression, recurrent (HCC) Comments: Increase Zoloft to 100 mg po daily.  Irritable bowel syndrome with diarrhea Comments: Generally controlled.  No signs of complications,  medication side effects, or red flags.  Continue current regimen.    Obesity (BMI 30.0-34.9) Comments: The patient is asked to make an attempt to improve diet and exercise patterns to aid in medical management of this problem.  Orders: -     Lipid panel  Low vitamin D level -     VITAMIN D 25 Hydroxy (Vit-D Deficiency, Fractures) -     Cholecalciferol 50000 units TABS; 50,000 units PO qwk for 8 weeks.  Fatigue, unspecified type -     CBC with Differential/Platelet -     Comprehensive metabolic panel -     Vitamin B12 -     TSH  Screening for lipid disorders -     Lipid panel   . Reviewed expectations re: course of current medical issues. . Discussed self-management of symptoms. . Outlined signs and symptoms indicating need for more acute intervention. . Patient verbalized understanding and all questions were answered. . See orders for this visit as documented in the electronic medical record. . Patient received an After Visit Summary.  Records requested if needed. I spent 45 minutes with this patient, greater than 50% was face-to-face time counseling regarding the above diagnoses.  CMA served as Neurosurgeon during this visit. History, Physical, and Plan performed by medical provider. Documentation and orders reviewed and attested to. Helane Rima, D.O.  Helane Rima, D.O. [place], [place] 12/10/2016   Follow-up: No Follow-up on file.  Meds ordered this encounter  Medications  . albuterol (PROVENTIL HFA;VENTOLIN HFA) 108 (90 Base) MCG/ACT inhaler    Sig: Inhale into the lungs every 6 (six) hours as needed for wheezing or shortness of breath.  . Cholecalciferol 50000 units TABS    Sig: 50,000 units PO qwk for 8 weeks.    Dispense:  8 tablet    Refill:  0   There are no discontinued medications. Orders Placed This Encounter  Procedures  . CBC with Differential/Platelet  . Comprehensive metabolic panel  . Lipid panel  . Vitamin B12  . VITAMIN D 25 Hydroxy (Vit-D  Deficiency, Fractures)  . TSH

## 2016-12-09 NOTE — Progress Notes (Signed)
Pre visit review using our clinic review tool, if applicable. No additional management support is needed unless otherwise documented below in the visit note. 

## 2016-12-10 ENCOUNTER — Encounter: Payer: Self-pay | Admitting: Family Medicine

## 2016-12-10 DIAGNOSIS — F411 Generalized anxiety disorder: Secondary | ICD-10-CM | POA: Insufficient documentation

## 2016-12-10 DIAGNOSIS — K635 Polyp of colon: Secondary | ICD-10-CM | POA: Insufficient documentation

## 2016-12-10 DIAGNOSIS — K5790 Diverticulosis of intestine, part unspecified, without perforation or abscess without bleeding: Secondary | ICD-10-CM | POA: Insufficient documentation

## 2016-12-10 DIAGNOSIS — K219 Gastro-esophageal reflux disease without esophagitis: Secondary | ICD-10-CM | POA: Insufficient documentation

## 2016-12-10 DIAGNOSIS — E785 Hyperlipidemia, unspecified: Secondary | ICD-10-CM | POA: Insufficient documentation

## 2016-12-10 DIAGNOSIS — E7849 Other hyperlipidemia: Secondary | ICD-10-CM | POA: Insufficient documentation

## 2016-12-10 DIAGNOSIS — F339 Major depressive disorder, recurrent, unspecified: Secondary | ICD-10-CM | POA: Insufficient documentation

## 2016-12-10 DIAGNOSIS — E669 Obesity, unspecified: Secondary | ICD-10-CM | POA: Insufficient documentation

## 2016-12-10 DIAGNOSIS — Z6832 Body mass index (BMI) 32.0-32.9, adult: Secondary | ICD-10-CM | POA: Insufficient documentation

## 2016-12-10 DIAGNOSIS — K589 Irritable bowel syndrome without diarrhea: Secondary | ICD-10-CM | POA: Insufficient documentation

## 2016-12-10 DIAGNOSIS — M1990 Unspecified osteoarthritis, unspecified site: Secondary | ICD-10-CM | POA: Insufficient documentation

## 2016-12-10 DIAGNOSIS — J45909 Unspecified asthma, uncomplicated: Secondary | ICD-10-CM | POA: Insufficient documentation

## 2016-12-10 MED ORDER — CHOLECALCIFEROL 1.25 MG (50000 UT) PO TABS: ORAL_TABLET | ORAL | 0 refills | Status: DC

## 2016-12-27 ENCOUNTER — Encounter: Payer: Self-pay | Admitting: Gastroenterology

## 2016-12-27 ENCOUNTER — Ambulatory Visit (INDEPENDENT_AMBULATORY_CARE_PROVIDER_SITE_OTHER): Payer: 59 | Admitting: Gastroenterology

## 2016-12-27 VITALS — BP 124/80 | HR 84 | Ht 61.5 in | Wt 177.8 lb

## 2016-12-27 DIAGNOSIS — K580 Irritable bowel syndrome with diarrhea: Secondary | ICD-10-CM | POA: Diagnosis not present

## 2016-12-27 MED ORDER — COLESTIPOL HCL 1 G PO TABS: 1.0000 g | ORAL_TABLET | Freq: Every day | ORAL | 3 refills | Status: DC

## 2016-12-27 MED ORDER — CILIDINIUM-CHLORDIAZEPOXIDE 2.5-5 MG PO CAPS: 1.0000 | ORAL_CAPSULE | Freq: Three times a day (TID) | ORAL | 3 refills | Status: DC

## 2016-12-27 MED ORDER — ELUXADOLINE 100 MG PO TABS: 1.0000 | ORAL_TABLET | Freq: Two times a day (BID) | ORAL | 3 refills | Status: DC

## 2016-12-27 NOTE — Patient Instructions (Addendum)
We have sent in a 90 day supply of Viberzi, Librax and colestid  to your local pharmacy  Follow up in 1 year

## 2016-12-27 NOTE — Progress Notes (Signed)
Kim Clements    [id number]    11-28-1965  Primary Care Physician:Erica Earlene Plater, DO  Referring Physician: Joycelyn Rua, MD [address] [place], [place] [address]  Chief complaint:  IBS diarrhea  HPI:  51 year old female with history of irritable bowel syndrome predominant diarrhea here for follow-up visit. Patient reports significant improvement of her symptoms with Viberzi. She hasn't needed Imodium in the past 2 years. She continues to use Colestid daily and BuSpar as needed. She had an episode of diarrhea 3 weeks ago, her husband was also affected at the time and she thinks it was a GI bug. Overall she is doing well and denies any specific GI complaints. Denies any nausea, vomiting, abdominal pain, melena or bright red blood per rectum     Outpatient Encounter Prescriptions as of 12/27/2016  Medication Sig  . albuterol (PROVENTIL HFA;VENTOLIN HFA) 108 (90 Base) MCG/ACT inhaler Inhale into the lungs every 6 (six) hours as needed for wheezing or shortness of breath.  . AMBULATORY NON FORMULARY MEDICATION Allergy Shots 2 shots per week  . amitriptyline (ELAVIL) 10 MG tablet Take 10 mg by mouth at bedtime.  . bismuth subsalicylate (PEPTO BISMOL) 262 MG/15ML suspension Take 30 mLs by mouth as needed.  . busPIRone (BUSPAR) 15 MG tablet Take 15 mg by mouth 2 (two) times daily.  . calcium carbonate (TUMS - DOSED IN MG ELEMENTAL CALCIUM) 500 MG chewable tablet Chew 1 tablet by mouth daily.  . Cholecalciferol 50000 units TABS 50,000 units PO qwk for 8 weeks.  . clidinium-chlordiazePOXIDE (LIBRAX) 5-2.5 MG capsule TAKE ONE CAPSULE BY MOUTH 4 TIMES A DAY BEFORE MEALS AND AT BEDTIME (TITRATE AS NEEDED)  . colestipol (COLESTID) 1 g tablet Take 1-2 tablets (1-2 g total) by mouth daily.  . Eluxadoline (VIBERZI) 100 MG TABS Take 1 tablet by mouth 2 (two) times daily.  . fexofenadine (ALLEGRA) 180 MG tablet Take 180 mg by mouth daily.  . hydrOXYzine  (ATARAX/VISTARIL) 25 MG tablet Take 50 mg by mouth 3 (three) times daily as needed.  . Loperamide HCl (IMODIUM A-D PO) Take by mouth as needed.  . sertraline (ZOLOFT) 50 MG tablet Take 100 mg by mouth daily.   . Simethicone (GAS-X PO) Take by mouth as needed.   No facility-administered encounter medications on file as of 12/27/2016.     Allergies as of 12/27/2016 - Review Complete 12/27/2016  Allergen Reaction Noted  . Erythromycin  08/22/2015  . Penicillins  08/22/2015  . Sulfa antibiotics  08/22/2015    Past Medical History:  Diagnosis Date  . Anal fissure   . Anxiety   . Arthritis   . Asthma   . Colon polyps   . Diverticulosis   . GERD (gastroesophageal reflux disease)   . HLD (hyperlipidemia)   . IBS (irritable bowel syndrome)     Past Surgical History:  Procedure Laterality Date  . ABDOMINAL HYSTERECTOMY    . LAPAROSCOPY  1988   Laser. Indication: Endometriosis.  . TONSILLECTOMY  1971    Family History  Problem Relation Age of Onset  . Breast cancer Paternal Grandmother     all of paternal females  . Breast cancer Paternal Aunt   . Prostate cancer Paternal Grandfather   . Heart disease Paternal Grandfather   . Colon polyps Mother   . Heart disease Mother   . Rheum arthritis Mother   . Osteoarthritis Mother   . Colon polyps Father   .  Stroke Father   . Heart disease Maternal Grandfather   . Heart disease Maternal Grandmother   . Colon cancer Neg Hx     Social History   Social History  . Marital status: Married    Spouse name: N/A  . Number of children: 1  . 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 Smoking status: Never Smoker  . Smokeless tobacco: Never Used  . Alcohol use No  . Drug use: No  . Sexual activity: Not on file   Other Topics Concern  . Not on file   Social History Narrative  . No narrative on file      Review of systems: Review of Systems  Constitutional:  Negative for fever and chills.  HENT: Negative.   Eyes: Negative for blurred vision.  Respiratory: Negative for cough, shortness of breath and wheezing.   Cardiovascular: Negative for chest pain and palpitations.  Gastrointestinal: as per HPI Genitourinary: Negative for dysuria, urgency, frequency and hematuria.  Musculoskeletal: Negative for myalgias, back pain and joint pain.  Skin: Negative for itching and rash.  Neurological: Negative for dizziness, tremors, focal weakness, seizures and loss of consciousness.  Endo/Heme/Allergies: Positive for seasonal allergies.  Psychiatric/Behavioral: Negative for suicidal ideas and hallucinations.  positive for depression and anxiety All other systems reviewed and are negative.   Physical Exam: Vitals:   12/27/16 1333  BP: 124/80  Pulse: 84   Body mass index is 33.05 kg/m. Gen:      No acute distress HEENT:  EOMI, sclera anicteric Neck:     No masses; no thyromegaly Lungs:    Clear to auscultation bilaterally; normal respiratory effort CV:         Regular rate and rhythm; no murmurs Abd:      + bowel sounds; soft, non-tender; no palpable masses, no distension Ext:    No edema; adequate peripheral perfusion Skin:      Warm and dry; no rash Neuro: alert and oriented x 3 Psych: normal mood and affect  Data Reviewed:  Reviewed labs, radiology imaging, old records and pertinent past GI work up   Assessment and Plan/Recommendations:  51 year old female with history of irritable bowel syndrome predominant diarrhea here for follow-up visit  IBS-Diarrhea: Overall doing well with Viberzi100 mg twice daily Continue Colestid and BuSpar  Colorectal cancer screening: Average risk Due for screening colonoscopy in June 2027  Return in 1 year or sooner if needed   Iona Beard , MD [phone] Mon-Fri 8a-5p [phone] after 5p, weekends, holidays  CC: Joycelyn Rua, MD

## 2017-01-03 ENCOUNTER — Ambulatory Visit (INDEPENDENT_AMBULATORY_CARE_PROVIDER_SITE_OTHER): Payer: 59 | Admitting: Psychology

## 2017-01-03 ENCOUNTER — Encounter: Payer: Self-pay | Admitting: Family Medicine

## 2017-01-03 DIAGNOSIS — F411 Generalized anxiety disorder: Secondary | ICD-10-CM

## 2017-01-05 ENCOUNTER — Telehealth: Payer: Self-pay | Admitting: Family Medicine

## 2017-01-05 NOTE — Telephone Encounter (Signed)
Please advise on refill. Historical provider.  

## 2017-01-05 NOTE — Telephone Encounter (Signed)
**  Remind patient they can make refill requests via MyChart**  Medication refill request (Name & Dosage): amitriptyline (ELAVIL) 10 MG tablet  sertraline (ZOLOFT) 50 MG tablet Preferred pharmacy (Name & Address): [facility] [phone] - [place], [place] - [address] [phone] (Phone) [phone] (Fax)        Other comments (if applicable):   Patient stated provider wanted her Zoloft to be .  Patient stated provider talked about her coming off of Buspar- wanted guidance as to how and when.

## 2017-01-06 MED ORDER — SERTRALINE HCL 50 MG PO TABS: 100.0000 mg | ORAL_TABLET | Freq: Every day | ORAL | 2 refills | Status: DC

## 2017-01-06 MED ORDER — AMITRIPTYLINE HCL 10 MG PO TABS: 10.0000 mg | ORAL_TABLET | Freq: Every day | ORAL | 0 refills | Status: DC

## 2017-01-06 NOTE — Addendum Note (Signed)
Addended by: Dorian PodWHEELEY, Mykael Batz J on: 01/06/2017 10:56 AM   Modules accepted: Orders

## 2017-01-06 NOTE — Telephone Encounter (Signed)
Notified patient that prescriptions have been sent to pharmacy. Also explained to patient how Dr. Earlene PlaterWallace would like her to taper off of Buspar. Patient verbalized understanding.

## 2017-01-06 NOTE — Telephone Encounter (Signed)
Okay refill. If she would like to wean Buspar, try every other day for 1-2 weeks, then off.

## 2017-01-20 ENCOUNTER — Ambulatory Visit (INDEPENDENT_AMBULATORY_CARE_PROVIDER_SITE_OTHER): Payer: 59 | Admitting: Psychology

## 2017-01-20 DIAGNOSIS — F411 Generalized anxiety disorder: Secondary | ICD-10-CM | POA: Diagnosis not present

## 2017-02-01 ENCOUNTER — Ambulatory Visit (INDEPENDENT_AMBULATORY_CARE_PROVIDER_SITE_OTHER): Payer: 59 | Admitting: Psychology

## 2017-02-01 DIAGNOSIS — F411 Generalized anxiety disorder: Secondary | ICD-10-CM

## 2017-02-04 ENCOUNTER — Telehealth: Payer: Self-pay | Admitting: Family Medicine

## 2017-02-04 NOTE — Telephone Encounter (Signed)
Patient fell today and thinks she injured her knee. I advised her to call the [place] or [place] office to be seen over the weekend due to her not being sure if she needs an X-ray. The patient stated that she would call either office in the morning if she is in pain or will call us Monday to set up an appointment with Dr. Earlene PlaterWallace.

## 2017-02-07 ENCOUNTER — Encounter: Payer: Self-pay | Admitting: Family Medicine

## 2017-02-07 ENCOUNTER — Ambulatory Visit (INDEPENDENT_AMBULATORY_CARE_PROVIDER_SITE_OTHER): Payer: 59

## 2017-02-07 ENCOUNTER — Ambulatory Visit (INDEPENDENT_AMBULATORY_CARE_PROVIDER_SITE_OTHER): Payer: 59 | Admitting: Family Medicine

## 2017-02-07 VITALS — BP 114/78 | HR 94 | Temp 98.4°F | Ht 61.5 in | Wt 180.0 lb

## 2017-02-07 DIAGNOSIS — M25561 Pain in right knee: Secondary | ICD-10-CM | POA: Diagnosis not present

## 2017-02-07 MED ORDER — SERTRALINE HCL 100 MG PO TABS: 100.0000 mg | ORAL_TABLET | Freq: Every day | ORAL | 3 refills | Status: DC

## 2017-02-07 NOTE — Progress Notes (Signed)
Kim Clements is a 51 y.o. female here for an acute visit.  History of Present Illness:   Kim Clements acting as scribe for Dr. Earlene PlaterWallace.  Knee Pain   The incident occurred 3 to 5 days ago. The incident occurred at school. The pain is present in the right knee. The quality of the pain is described as stabbing and shooting. The pain is at a severity of 1/10. The pain is mild. The pain has been intermittent since onset. Pertinent negatives include no inability to bear weight, loss of motion, numbness or tingling. She reports no foreign bodies present. The symptoms are aggravated by weight bearing. She has tried ice, elevation and NSAIDs for the symptoms. The treatment provided mild relief.   PMHx, SurgHx, SocialHx, Medications, and Allergies were reviewed in the Visit Navigator and updated as appropriate.  Current Medications:   Current Outpatient Prescriptions:  .  albuterol (PROVENTIL HFA;VENTOLIN HFA) 108 (90 Base) MCG/ACT inhaler, Inhale into the lungs every 6 (six) hours as needed for wheezing or shortness of breath., Disp: , Rfl:  .  AMBULATORY NON FORMULARY MEDICATION, Allergy Shots 2 shots per week, Disp: , Rfl:  .  amitriptyline (ELAVIL) 10 MG tablet, Take 1 tablet (10 mg total) by mouth at bedtime., Disp: 30 tablet, Rfl: 0 .  bismuth subsalicylate (PEPTO BISMOL) 262 MG/15ML suspension, Take 30 mLs by mouth as needed., Disp: , Rfl:  .  busPIRone (BUSPAR) 15 MG tablet, Take 15 mg by mouth 2 (two) times daily., Disp: , Rfl:  .  calcium carbonate (TUMS - DOSED IN MG ELEMENTAL CALCIUM) 500 MG chewable tablet, Chew 1 tablet by mouth daily., Disp: , Rfl:  .  Cholecalciferol 50000 units TABS, 50,000 units PO qwk for 8 weeks., Disp: 8 tablet, Rfl: 0 .  clidinium-chlordiazePOXIDE (LIBRAX) 5-2.5 MG capsule, Take 1 capsule by mouth 4 (four) times daily -  before meals and at bedtime., Disp: 360 capsule, Rfl: 3 .  colestipol (COLESTID) 1 g tablet, Take 1-2 tablets (1-2 g total) by  mouth daily., Disp: 90 tablet, Rfl: 3 .  Eluxadoline (VIBERZI) 100 MG TABS, Take 1 tablet (100 mg total) by mouth 2 (two) times daily., Disp: 180 tablet, Rfl: 3 .  fexofenadine (ALLEGRA) 180 MG tablet, Take 180 mg by mouth daily., Disp: , Rfl:  .  hydrOXYzine (ATARAX/VISTARIL) 25 MG tablet, Take 50 mg by mouth 3 (three) times daily as needed., Disp: , Rfl:  .  Loperamide HCl (IMODIUM A-D PO), Take by mouth as needed., Disp: , Rfl:  .  sertraline (ZOLOFT) 50 MG tablet, Take 2 tablets (100 mg total) by mouth at bedtime., Disp: 90 tablet, Rfl: 2 .  Simethicone (GAS-X PO), Take by mouth as needed., Disp: , Rfl:  .  sertraline (ZOLOFT) 100 MG tablet, Take 1 tablet (100 mg total) by mouth daily., Disp: 30 tablet, Rfl: 3   Allergies  Allergen Reactions  . Erythromycin   . Penicillins   . Sulfa Antibiotics    Review of Systems:   Review of Systems  Constitutional: Negative for chills, fever and malaise/fatigue.  HENT: Negative for ear pain, sinus pain and sore throat.   Eyes: Negative for blurred vision and double vision.  Respiratory: Negative for cough, shortness of breath and wheezing.   Cardiovascular: Negative for chest pain, palpitations and leg swelling.  Gastrointestinal: Negative for abdominal pain, nausea and vomiting.  Musculoskeletal: Positive for joint pain. Negative for back pain and neck pain.  Neurological: Negative for dizziness, tingling,  numbness and headaches.  Psychiatric/Behavioral: Positive for depression. Negative for hallucinations and memory loss.   Vitals:   Vitals:   02/07/17 1520  BP: 114/78  Pulse: 94  Temp: 98.4 F (36.9 C)  TempSrc: Oral  SpO2: 97%  Weight: 180 lb (81.6 kg)  Height: 5' 1.5" (1.562 m)     Body mass index is 33.46 kg/m.  Physical Exam:   Physical Exam  Constitutional: She appears well-developed and well-nourished. No distress.  HENT:  Head: Normocephalic and atraumatic.  Eyes: EOM are normal. Pupils are equal, round, and  reactive to light.  Neck: Normal range of motion. Neck supple.  Cardiovascular: Normal rate, regular rhythm and intact distal pulses.   Pulmonary/Chest: Effort normal.  Abdominal: Soft.  Musculoskeletal:       Right knee: She exhibits decreased range of motion, ecchymosis and bony tenderness. Tenderness found. Patellar tendon tenderness noted.       Legs: Psychiatric: She has a normal mood and affect. Her behavior is normal.  Nursing note and vitals reviewed.  EXAM: RIGHT KNEE 3 VIEWS  FINDINGS: Diffuse osseous demineralization.  Minimal joint space narrowing.  No acute fracture, dislocation, or bone destruction.  Anterior soft tissue swelling.  No knee joint effusion.  LEFT knee demonstrates similar degree of osseous demineralization and joint space narrowing  IMPRESSION: Degenerative changes without acute bony abnormalities.  Assessment and Plan:   Kim Clements was seen today for knee pain.  Diagnoses and all orders for this visit:  Acute pain of right knee Comments: Xray without acute bony finding. Abrasions on knee healing, without signs of infection. Wounds dressed. Knee sleeve provided. Aftercare reviewed. Precautions discussed as well. To Kim Clements if not improving.  Orders: -     DG Knee AP/LAT W/Sunrise Right   . Reviewed expectations re: course of current medical issues. . Discussed self-management of symptoms. . Outlined signs and symptoms indicating need for more acute intervention. . Patient verbalized understanding and all questions were answered. Marland Kitchen Health Maintenance issues including appropriate healthy diet, exercise, and smoking avoidance were discussed with patient. . See orders for this visit as documented in the electronic medical record. . Patient received an After Visit Summary.  Clements served as Neurosurgeon during this visit. History, Physical, and Plan performed by medical provider. The above documentation has been reviewed and is accurate and  complete. Helane Rima, D.O.  Helane Rima, DO [place] [place] 02/07/2017  No future appointments.

## 2017-02-11 ENCOUNTER — Encounter: Payer: Self-pay | Admitting: Family Medicine

## 2017-02-14 ENCOUNTER — Ambulatory Visit (INDEPENDENT_AMBULATORY_CARE_PROVIDER_SITE_OTHER): Payer: 59 | Admitting: Psychology

## 2017-02-14 ENCOUNTER — Other Ambulatory Visit: Payer: Self-pay

## 2017-02-14 DIAGNOSIS — E2839 Other primary ovarian failure: Secondary | ICD-10-CM

## 2017-02-14 DIAGNOSIS — F411 Generalized anxiety disorder: Secondary | ICD-10-CM

## 2017-02-24 ENCOUNTER — Ambulatory Visit
Admission: RE | Admit: 2017-02-24 | Discharge: 2017-02-24 | Disposition: A | Discharge status: Home / Self Care | Payer: 59 | Source: Ambulatory Visit | Attending: Family Medicine | Admitting: Family Medicine

## 2017-02-24 DIAGNOSIS — E2839 Other primary ovarian failure: Secondary | ICD-10-CM

## 2017-03-01 ENCOUNTER — Ambulatory Visit (INDEPENDENT_AMBULATORY_CARE_PROVIDER_SITE_OTHER): Payer: 59 | Admitting: Psychology

## 2017-03-01 DIAGNOSIS — F411 Generalized anxiety disorder: Secondary | ICD-10-CM | POA: Diagnosis not present

## 2017-03-07 LAB — HM PAP SMEAR: HM Pap smear: NEGATIVE

## 2017-03-23 ENCOUNTER — Ambulatory Visit (INDEPENDENT_AMBULATORY_CARE_PROVIDER_SITE_OTHER): Payer: 59 | Admitting: Psychology

## 2017-03-23 DIAGNOSIS — F411 Generalized anxiety disorder: Secondary | ICD-10-CM | POA: Diagnosis not present

## 2017-04-07 ENCOUNTER — Other Ambulatory Visit: Payer: Self-pay

## 2017-04-07 MED ORDER — SERTRALINE HCL 100 MG PO TABS: 100.0000 mg | ORAL_TABLET | Freq: Every day | ORAL | 1 refills | Status: DC

## 2017-04-14 ENCOUNTER — Ambulatory Visit: Payer: 59 | Admitting: Psychology

## 2017-04-18 ENCOUNTER — Other Ambulatory Visit: Payer: Self-pay | Admitting: Family Medicine

## 2017-05-12 ENCOUNTER — Ambulatory Visit: Payer: 59 | Admitting: Psychology

## 2017-05-27 ENCOUNTER — Encounter: Payer: Self-pay | Admitting: Sports Medicine

## 2017-05-27 ENCOUNTER — Ambulatory Visit (INDEPENDENT_AMBULATORY_CARE_PROVIDER_SITE_OTHER): Payer: 59 | Admitting: Sports Medicine

## 2017-05-27 ENCOUNTER — Ambulatory Visit (INDEPENDENT_AMBULATORY_CARE_PROVIDER_SITE_OTHER): Payer: 59

## 2017-05-27 VITALS — BP 110/76 | HR 86 | Ht 61.5 in | Wt 178.4 lb

## 2017-05-27 DIAGNOSIS — G8929 Other chronic pain: Secondary | ICD-10-CM | POA: Diagnosis not present

## 2017-05-27 DIAGNOSIS — M25572 Pain in left ankle and joints of left foot: Secondary | ICD-10-CM

## 2017-05-27 MED ORDER — NAPROXEN-ESOMEPRAZOLE 500-20 MG PO TBEC: 1.0000 | DELAYED_RELEASE_TABLET | Freq: Two times a day (BID) | ORAL | 0 refills | Status: AC

## 2017-05-27 MED ORDER — NAPROXEN-ESOMEPRAZOLE 500-20 MG PO TBEC: 1.0000 | DELAYED_RELEASE_TABLET | Freq: Two times a day (BID) | ORAL | 2 refills | Status: DC

## 2017-05-27 NOTE — Patient Instructions (Addendum)
I recommend you obtained a compression sleeve to help with your joint problems. There are many options on the market however I recommend obtaining a Full Ankle Body Helix compression sleeve.  You can find information (including how to appropriate measure yourself for sizing) can be found at [email or web address].  Many of these products are health savings account (HSA) eligible.   You can use the compression sleeve at any time throughout the day but is most important to use while being active as well as for 2 hours post-activity.   It is appropriate to ice following activity with the compression sleeve in place.       Please perform the exercise program that we have prepared for you and gone over in detail on a daily basis.  In addition to the handout you were provided you can access your program through: [email or web address]   Your unique program code is: [email or web address]

## 2017-05-27 NOTE — Progress Notes (Signed)
OFFICE VISIT NOTE Kim Clements. [facility] at [facility] [phone]  Vaudine Dutan - 51 y.o. female MRN [id number]  Date of birth: [birth year]  Visit Date: 05/27/2017  PCP: Helane Rima, DO   Referred by: Helane Rima, DO  Orlie Dakin, CMA acting as scribe for Dr. Berline Chough.  SUBJECTIVE:   Chief Complaint  Patient presents with  . New Patient (Initial Visit)    LT ankle pain and swelling   HPI: As below and per problem based documentation when appropriate.  Kim Clements is a new patient presenting today for evaluation of LT ankle pain and swelling.  Pain has been present off and on for the past several years. Pain and swelling has increased over the past 1-2 weeks.  No known injury of trauma.   The pain is described as aching and throbbing and is rated as 2/10.  Worsened with walking around, sitting with foot straight down. Minimal pain with flexion, extension, rotation Improves with elevation Therapies tried include : She has not taken any OTC meds for the pain. She has not tried ice or heat.   Other associated symptoms include: No radiation of pain into the foot or lower leg. No known hx of gout. She denies increased warmth or erythema.   Pt expresses concern about swelling d/t family hx of heart disease.    Review of Systems  Constitutional: Negative for chills and fever.  Respiratory: Negative for shortness of breath and wheezing.   Cardiovascular: Negative for chest pain, palpitations and leg swelling.  Gastrointestinal: Negative.   Genitourinary: Negative.   Musculoskeletal: Positive for joint pain. Negative for falls.  Neurological: Positive for headaches (x 1-2 weeks, hx of migraines). Negative for dizziness and tingling.  Endo/Heme/Allergies: Does not bruise/bleed easily.    Otherwise per HPI.  HISTORY & PERTINENT PRIOR DATA:  No specialty comments available. She reports that she has never  smoked. She has never used smokeless tobacco. No results for input(s): HGBA1C, LABURIC in the last 8760 hours. Medications & Allergies reviewed per EMR Patient Active Problem List   Diagnosis Date Noted  . Chronic pain of left ankle 07/03/2017  . GAD (generalized anxiety disorder) 12/10/2016  . Obesity (BMI 30.0-34.9) 12/10/2016  . Depression, recurrent (HCC) 12/10/2016  . IBS (irritable bowel syndrome)   . HLD (hyperlipidemia)   . GERD (gastroesophageal reflux disease)   . Diverticulosis   . Colon polyps   . Asthma   . Arthritis    Past Medical History:  Diagnosis Date  . Anal fissure   . Anxiety   . Arthritis   . Asthma   . Colon polyps   . Diverticulosis   . GERD (gastroesophageal reflux disease)   . HLD (hyperlipidemia)   . IBS (irritable bowel syndrome)    Family History  Problem Relation Age of Onset  . Breast cancer Paternal Grandmother        all of paternal females  . Breast cancer Paternal Aunt   . Prostate cancer Paternal Grandfather   . Heart disease Paternal Grandfather   . Colon polyps Mother   . Heart disease Mother   . Rheum arthritis Mother   . Osteoarthritis Mother   . Colon polyps Father   . Stroke Father   . Heart disease Maternal Grandfather   . Heart disease Maternal Grandmother   . Colon cancer Neg Hx    Past Surgical History:  Procedure Laterality Date  . ABDOMINAL HYSTERECTOMY    .  LAPAROSCOPY  1988   Laser. Indication: Endometriosis.  . 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 Smoking status: Never Smoker  . Smokeless tobacco: Never Used  . Alcohol use No  . Drug use: No  . Sexual activity: Not on file    OBJECTIVE:  VS:  HT:5' 1.5" (156.2 cm)   WT:178 lb 6.4 oz (80.9 kg)  BMI:33.17    BP:110/76  HR:86bpm  TEMP: ( )  RESP:97 % EXAM: Findings:  WDWN, NAD, Non-toxic appearing Alert & appropriately interactive Not depressed or anxious appearing No  increased work of breathing. Pupils are equal. EOM intact without nystagmus No clubbing or cyanosis of the extremities appreciated No significant rashes/lesions/ulcerations overlying the examined area. DP & PT pulses 2+/4.  No significant pretibial edema. Sensation intact to light touch in lower extremities.   left ankle: Overall well aligned.  No significant deformity.  She has a small amount of ankle edema that is pitting but no generalized pretibial edema.  Ankle drawer testing is stable she does have pain with talar tilt.  Limited range of motion.  Intrinsic ankle motion is intact with strength that is 5 out of 5.      ASSESSMENT & PLAN:     ICD-10-CM   1. Chronic pain of left ankle M25.572 DG Ankle Complete Left   G89.29    ================================================================= Chronic pain of left ankle Symptoms are consistent with underlying degenerative changes of the ankle with small amount of arthritis appreciated on  x-ray.  Discussed multiple options including continued conservative management recommend obtaining compression sleeve.  If any lack of improvement can consider injection.  Working on strengthening as well as eccentric heel drops will help with range of motion and maintain her mobility.  Exercises reviewed.   ================================================================= Patient Instructions  I recommend you obtained a compression sleeve to help with your joint problems. There are many options on the market however I recommend obtaining a Full Ankle Body Helix compression sleeve.  You can find information (including how to appropriate measure yourself for sizing) can be found at [email or web address].  Many of these products are health savings account (HSA) eligible.   You can use the compression sleeve at any time throughout the day but is most important to use while being active as well as for 2 hours post-activity.   It is appropriate to ice following  activity with the compression sleeve in place.       Please perform the exercise program that we have prepared for you and gone over in detail on a daily basis.  In addition to the handout you were provided you can access your program through: [email or web address]   Your unique program code is: [email or web address]  ================================================================= Future Appointments Date Time Provider Department Center  07/07/2017 11:00 AM Helane Rima, DO [facility] None    Follow-up: Return in about 6 weeks (around 07/08/2017).   CMA/ATC served as Neurosurgeon during this visit. History, Physical, and Plan performed by medical provider. Documentation and orders reviewed and attested to.      Gaspar Bidding, DO    [facility] Phys[facility]

## 2017-06-08 ENCOUNTER — Ambulatory Visit: Payer: 59 | Admitting: Psychology

## 2017-06-14 ENCOUNTER — Other Ambulatory Visit: Payer: Self-pay | Admitting: Family Medicine

## 2017-06-16 ENCOUNTER — Telehealth: Payer: Self-pay | Admitting: Family Medicine

## 2017-06-16 NOTE — Telephone Encounter (Signed)
30 Day supply sent to pharmacy.

## 2017-06-16 NOTE — Telephone Encounter (Signed)
MEDICATION:  amitriptyline (ELAVIL) 10 MG tablet PHARMACY:   [id number] - [place], [place] - [address] [phone] ([phone] (Fax)    IS THIS A 90 DAY SUPPLY : Y patient would like 90 day   IS PATIENT OUT OF MEDICATION: N  IF NOT; HOW MUCH IS LEFT: 2 left  LAST APPOINTMENT DATE: /05/2017  NEXT APPOINTMENT DATE:@11 /09/2016  OTHER COMMENTS: [facility] and [facility] have not [facility] yet. Please advise.    **Let patient know to contact pharmacy at the end of the day to make sure medication is ready. **  ** Please notify patient to allow 48-72 hours to process**  **Encourage patient to contact the pharmacy for refills or they can request refills through [facility]**

## 2017-07-03 DIAGNOSIS — G8929 Other chronic pain: Secondary | ICD-10-CM | POA: Insufficient documentation

## 2017-07-03 DIAGNOSIS — M25572 Pain in left ankle and joints of left foot: Principal | ICD-10-CM

## 2017-07-03 NOTE — Assessment & Plan Note (Signed)
Symptoms are consistent with underlying degenerative changes of the ankle with small amount of arthritis appreciated on  x-ray.  Discussed multiple options including continued conservative management recommend obtaining compression sleeve.  If any lack of improvement can consider injection.  Working on strengthening as well as eccentric heel drops will help with range of motion and maintain her mobility.  Exercises reviewed.

## 2017-07-04 ENCOUNTER — Other Ambulatory Visit: Payer: Self-pay | Admitting: Gastroenterology

## 2017-07-04 ENCOUNTER — Other Ambulatory Visit: Payer: Self-pay | Admitting: Family Medicine

## 2017-07-07 ENCOUNTER — Ambulatory Visit: Payer: Self-pay | Admitting: Family Medicine

## 2017-07-22 ENCOUNTER — Encounter: Payer: Self-pay | Admitting: Sports Medicine

## 2017-07-22 ENCOUNTER — Ambulatory Visit (INDEPENDENT_AMBULATORY_CARE_PROVIDER_SITE_OTHER): Payer: 59 | Admitting: Sports Medicine

## 2017-07-22 VITALS — BP 112/66 | HR 74 | Ht 61.5 in | Wt 178.6 lb

## 2017-07-22 DIAGNOSIS — G8929 Other chronic pain: Secondary | ICD-10-CM | POA: Diagnosis not present

## 2017-07-22 DIAGNOSIS — M25572 Pain in left ankle and joints of left foot: Secondary | ICD-10-CM | POA: Diagnosis not present

## 2017-07-22 NOTE — Progress Notes (Signed)
OFFICE VISIT NOTE Veverly FellsMichael D. Delorise Shinerigby, DO  [facility] [phone]  Lemar LivingsCatherine Fulton Lotspeich - 51 y.o. female MRN [id number]  Date of birth: 06-20-1966  Visit Date: 07/22/2017  PCP: Helane RimaWallace, Erica, DO   Referred by: Helane RimaWallace, Erica, DO  Fabio PierceMolly A Weber PT, LAT, ATC acting as scribe for Dr. Berline Choughigby.  SUBJECTIVE:   Chief Complaint  Patient presents with  . Follow-up    L ankle pain   HPI: As below and per problem based documentation when appropriate.  Kim PerchesCathy Amberg is an established pt presenting today for f/u of her L ankle pain and swelling.  She was last seen on 05/27/17 and was provided a Body Helix ankle sleeve as well as a HEP for L ankle strengthening.  She states that her L ankle is feeling ok.  She notes that she wears her compression sleeve fairly consistently.  She notes that she con't to have swelling at her L lateral ankle and reports that her L Achille's con't to feel tight.  She states that her L ankle feels about the same but does note that she was able to go to [employer] and didn't have any increase in her symptoms.  Pt states that she isn't taking anything for her symptoms at this point.    Review of Systems  Constitutional: Negative for chills and fever.  HENT: Negative.   Eyes: Negative.   Respiratory: Negative for cough, shortness of breath and wheezing.   Cardiovascular: Negative for chest pain and palpitations.  Gastrointestinal: Negative for abdominal pain, heartburn and nausea.  Musculoskeletal: Positive for joint pain. Negative for falls.  Neurological: Negative for dizziness, tingling and headaches.  Endo/Heme/Allergies: Does not bruise/bleed easily.  Psychiatric/Behavioral: Positive for depression. The patient is nervous/anxious. The patient does not have insomnia.     Otherwise per HPI.   HISTORY & PERTINENT PRIOR DATA:  Prior History reviewed and updated per electronic medical record. Significant  history, findings, studies and interim changes include: No additional findings.  reports that  has never smoked. she has never used smokeless tobacco. No results for input(s): HGBA1C, LABURIC, CREATINE in the last 8760 hours. No problems updated.  OBJECTIVE:  VS:  HT:5' 1.5" (156.2 cm)   WT:178 lb 9.6 oz (81 kg)  BMI:33.2    BP:112/66  HR:74bpm  TEMP: ( )  RESP:99 %  PHYSICAL EXAM: Constitutional: WDWN, Non-toxic appearing. Psychiatric: Alert & appropriately interactive. Not depressed or anxious appearing. Respiratory: No increased work of breathing. Trachea Midline Eyes: Pupils are equal. EOM intact without nystagmus. No scleral icterus  LOWER EXTREMITIES: No clubbing or cyanosis appreciated No significant venous stasis changes No calf tenderness, negative Homan's sign, no calf cords Generalized/Pre-tibial edema: none Pedal Pulses: Normal & symmetrically palpable  Sensation in LE dermatomes: intact to light touch   Exam ankles: Left ankle is overall well aligned.   Slight generalized TTP over the anterior lateral. No significant ankle effusion.  Ligamentously stable. Dorsiflexion to 95 degrees  ASSESSMENT & PLAN:   1. Chronic pain of left ankle    Plan: Overall significantly better but persistent symptoms and are consistent with Achilles tendinosis.  Continue with Body Helix compression sleeve therapeutic exercises reviewed with the athletic training staff and we will plan to have her obtain over-the-counter longitudinal arch support to see if this provides some additional benefit.  Obtain [facility]K ultrasound follow-up if any lack of improvement.  PROCEDURE NOTE: THERAPEUTIC EXERCISES ([id number]) 15 minutes spent for Therapeutic exercises  as below and as referenced in the AVS. This included exercises focusing on stretching, strengthening, with significant focus on eccentric aspects.  Proper technique shown and discussed handout in great detail with ATC. All questions were  discussed and answered.   Long term goals include an improvement in range of motion, strength, endurance as well as avoiding reinjury. Frequency of visits is one time as determined during today's  office visit. Frequency of exercises to be performed is as per handout.  EXERCISES REVIEWED:  Alfredson exercises  Achilles and calf stretching    ++++++++++++++++++++++++++++++++++++++++++++ Follow-up: Return in about 8 weeks (around 09/16/2017).   Pertinent documentation may be included in additional procedure notes, imaging studies, problem based documentation and patient instructions. Please see these sections of the encounter for additional information regarding this visit. CMA/ATC served as Neurosurgeonscribe during this visit. History, Physical, and Plan performed by medical provider. Documentation and orders reviewed and attested to.      Andrena MewsMichael D Freddy Kinne, DO    [facility] Physician

## 2017-07-22 NOTE — Patient Instructions (Addendum)
I recommend that you obtain over-the-counter SOLE  medium cushioned insoles.  These can be found at [employer] - or on-line at [email or web address]  Search for "SOLE Active Medium Shoe Insoles"  Please perform the exercise program that we have prepared for you and gone over in detail on a daily basis.  In addition to the handout you were provided you can access your program through: [email or web address]   Your unique program code is: [email or web address]

## 2017-08-06 ENCOUNTER — Other Ambulatory Visit: Payer: Self-pay | Admitting: Family Medicine

## 2017-08-08 NOTE — Telephone Encounter (Signed)
Please advise on refill. Patient canceled last appointment on 07/07/17 and does not have follow up scheduled.

## 2017-08-09 ENCOUNTER — Encounter: Payer: Self-pay | Admitting: Sports Medicine

## 2017-08-20 ENCOUNTER — Other Ambulatory Visit: Payer: Self-pay | Admitting: Family Medicine

## 2017-09-16 ENCOUNTER — Ambulatory Visit: Payer: Self-pay | Admitting: Sports Medicine

## 2017-09-20 ENCOUNTER — Other Ambulatory Visit: Payer: Self-pay | Admitting: Surgical

## 2017-09-20 MED ORDER — AMITRIPTYLINE HCL 10 MG PO TABS: ORAL_TABLET | ORAL | 0 refills | Status: DC

## 2017-09-23 ENCOUNTER — Encounter: Payer: Self-pay | Admitting: Sports Medicine

## 2017-09-23 NOTE — Progress Notes (Signed)
This encounter was created in error - please disregard.

## 2017-09-27 ENCOUNTER — Encounter: Payer: Self-pay | Admitting: Sports Medicine

## 2017-09-27 ENCOUNTER — Ambulatory Visit: Payer: Self-pay

## 2017-09-27 ENCOUNTER — Ambulatory Visit (INDEPENDENT_AMBULATORY_CARE_PROVIDER_SITE_OTHER): Payer: 59 | Admitting: Sports Medicine

## 2017-09-27 VITALS — BP 110/76 | HR 86 | Ht 61.5 in | Wt 179.4 lb

## 2017-09-27 DIAGNOSIS — M25572 Pain in left ankle and joints of left foot: Secondary | ICD-10-CM | POA: Diagnosis not present

## 2017-09-27 DIAGNOSIS — G8929 Other chronic pain: Secondary | ICD-10-CM | POA: Diagnosis not present

## 2017-09-27 NOTE — Procedures (Signed)
PROCEDURE NOTE:  Ultrasound Guided: Injection: Left retrocalcaneal bursa Images were obtained and interpreted by myself, Gaspar BiddingMichael Kym Scannell, DO  Images have been saved and stored to PACS system. Images obtained on: GE S7 Ultrasound machine  ULTRASOUND FINDINGS:  Tendon is intact.  She has some increased edema around the insertion of the Achilles tendon and swelling of the retrocalcaneal bursa and fat pad.  Otherwise posterior structures are intact.  DESCRIPTION OF PROCEDURE:  The patient's clinical condition is marked by substantial pain and/or significant functional disability. Other conservative therapy has not provided relief, is contraindicated, or not appropriate. There is a reasonable likelihood that injection will significantly improve the patient's pain and/or functional impairment.  After discussing the risks, benefits and expected outcomes of the injection and all questions were reviewed and answered, the patient wished to undergo the above named procedure. Verbal consent was obtained.  The ultrasound was used to identify the target structure and adjacent neurovascular structures. The skin was then prepped in sterile fashion and the target structure was injected under direct visualization using sterile technique as below:  PREP: Alcohol, Ethel Chloride,  APPROACH: direct, single injection, 25g 1.5in. INJECTATE: 0.5 cc 1% lidocaine, 0.5cc 0.5% marcaine, 0.5cc 40mg /mL DepoMedrol   ASPIRATE: None   DRESSING: Band-Aid    Post procedural instructions including recommending icing and warning signs for infection were reviewed.  This procedure was well tolerated and there were no complications.   IMPRESSION: Succesful Ultrasound Guided: Injection

## 2017-09-27 NOTE — Patient Instructions (Signed)

## 2017-09-27 NOTE — Progress Notes (Signed)
Kim FellsMichael D. Delorise Shinerigby, DO  [facility] [place] [facility] [phone]  Kim LivingsCatherine Fulton Clements - 52 y.o. female MRN [id number]  Date of birth: 18-Apr-1966  Visit Date: 09/27/2017  PCP: Helane RimaWallace, Erica, DO   Referred by: Helane RimaWallace, Erica, DO   Scribe for today's visit: Stevenson ClinchBrandy Coleman, CMA     SUBJECTIVE:  Kim Clements "Lynden AngCathy" is here for Follow-up (LT ankle pain)  Compared to the last office visit, her previously described symptoms show no change, she still has pain and swelling in the ankle. She feels like her achilles is still tight, maybe even more tight.  Current symptoms are moderate & are radiating to achilles tendon.  She has not been wearing Body Helix compression sleeve because it doesn't fit with her winter shoes. When she was wearing it she did notice some improvement. She has been doing Alfredson exercises but has trouble d/t tightness in achilles.    ROS Denies night time disturbances. Denies fevers, chills, or night sweats. Denies unexplained weight loss. Denies personal history of cancer. Denies changes in bowel or bladder habits. Denies recent unreported falls. Denies new or worsening dyspnea or wheezing. Denies headaches or dizziness.  Denies numbness, tingling or weakness  In the extremities.  Denies dizziness or presyncopal episodes Denies lower extremity edema     HISTORY & PERTINENT PRIOR DATA:  Prior History reviewed and updated per electronic medical record.  Significant history, findings, studies and interim changes include:  reports that  has never smoked. she has never used smokeless tobacco. No results for input(s): HGBA1C, LABURIC, CREATINE in the last 8760 hours. No specialty comments available. No problems updated.  OBJECTIVE:  VS:  HT:5' 1.5" (156.2 cm)   WT:179 lb 6.4 oz (81.4 kg)  BMI:33.35    BP:110/76  HR:86bpm  TEMP: ( )  RESP:97 %   PHYSICAL EXAM: Constitutional: WDWN, Non-toxic  appearing. Psychiatric: Alert & appropriately interactive.  Not depressed or anxious appearing. Respiratory: No increased work of breathing.  Trachea Midline Eyes: Pupils are equal.  EOM intact without nystagmus.  No scleral icterus  NEUROVASCULAR exam: No clubbing or cyanosis appreciated No significant venous stasis changes Capillary Refill: normal, less than 2 seconds   Ankle is overall normal-appearing without significant malalignment.  She has no focal bony tenderness but does have pain within the retrocalcaneal bursa.  Mild pain with palpation of the Achilles but this is minimal and located more in the retrocalcaneal region.  Minimal pain with palpation of the calcaneus.  No pain with calcaneal squeeze. No additional findings.   ASSESSMENT & PLAN:   1. Chronic pain of left ankle    PLAN: After discussing the risks and benefits of ultrasound-guided retrocalcaneal bursa injection performed today.  We will have her begin to slowly increase her weightbearing activity but cautioned on the increased risk of rupture.  Continue with compression and Alfredson exercises and plan to follow-up in 6 weeks to ensure clinical improvement. No problem-specific Assessment & Plan notes found for this encounter.   ++++++++++++++++++++++++++++++++++++++++++++ Orders & Meds: Orders Placed This Encounter  Procedures  . US LIMITED JOINT SPACE STRUCTURES LOW LEFT(NO LINKED CHARGES)    No orders of the defined types were placed in this encounter.   ++++++++++++++++++++++++++++++++++++++++++++ Follow-up: Return in about 6 weeks (around 11/08/2017).   Pertinent documentation may be included in additional procedure notes, imaging studies, problem based documentation and patient instructions. Please see these sections of the encounter for additional information regarding this visit. CMA/ATC served as  scribe during this visit. History, Physical, and Plan performed by medical provider. Documentation and orders  reviewed and attested to.      [name], DO    [facility]

## 2017-10-03 ENCOUNTER — Encounter: Payer: Self-pay | Admitting: Family Medicine

## 2017-10-12 ENCOUNTER — Telehealth: Payer: Self-pay

## 2017-10-12 NOTE — Telephone Encounter (Signed)
Pap report from last provider received. Done on 03/07/17. Per Dr. Earlene PlaterWallace no HPV done patient will need to repeat in 1 yr. Called and reviewed with patient she would like to call when closer to time to make app.

## 2017-11-08 ENCOUNTER — Ambulatory Visit: Payer: 59 | Admitting: Sports Medicine

## 2017-12-05 ENCOUNTER — Other Ambulatory Visit: Payer: Self-pay | Admitting: Family Medicine

## 2017-12-15 ENCOUNTER — Ambulatory Visit: Payer: 59 | Admitting: Sports Medicine

## 2017-12-17 ENCOUNTER — Other Ambulatory Visit: Payer: Self-pay | Admitting: Family Medicine

## 2017-12-25 ENCOUNTER — Other Ambulatory Visit: Payer: Self-pay | Admitting: Gastroenterology

## 2018-01-05 ENCOUNTER — Other Ambulatory Visit: Payer: Self-pay | Admitting: Gastroenterology

## 2018-01-06 ENCOUNTER — Telehealth: Payer: Self-pay | Admitting: Gastroenterology

## 2018-01-06 ENCOUNTER — Other Ambulatory Visit: Payer: Self-pay | Admitting: Gastroenterology

## 2018-01-06 NOTE — Telephone Encounter (Signed)
Pt called her pharmacy this morning and they have not yet received refill for viberzi.

## 2018-01-06 NOTE — Telephone Encounter (Signed)
Resent Viberzi had to fax into the pharmacy  2nd time faxing this in

## 2018-02-14 ENCOUNTER — Encounter: Payer: Self-pay | Admitting: Family Medicine

## 2018-02-14 ENCOUNTER — Ambulatory Visit (INDEPENDENT_AMBULATORY_CARE_PROVIDER_SITE_OTHER): Payer: 59 | Admitting: Family Medicine

## 2018-02-14 VITALS — BP 118/68 | HR 82 | Temp 97.9°F | Ht 61.5 in | Wt 180.6 lb

## 2018-02-14 DIAGNOSIS — E669 Obesity, unspecified: Secondary | ICD-10-CM

## 2018-02-14 DIAGNOSIS — E781 Pure hyperglyceridemia: Secondary | ICD-10-CM | POA: Diagnosis not present

## 2018-02-14 DIAGNOSIS — E66811 Obesity, class 1: Secondary | ICD-10-CM

## 2018-02-14 DIAGNOSIS — Z0000 Encounter for general adult medical examination without abnormal findings: Secondary | ICD-10-CM

## 2018-02-14 DIAGNOSIS — Z8249 Family history of ischemic heart disease and other diseases of the circulatory system: Secondary | ICD-10-CM | POA: Diagnosis not present

## 2018-02-14 DIAGNOSIS — L989 Disorder of the skin and subcutaneous tissue, unspecified: Secondary | ICD-10-CM

## 2018-02-14 DIAGNOSIS — R7989 Other specified abnormal findings of blood chemistry: Secondary | ICD-10-CM

## 2018-02-14 DIAGNOSIS — Z13220 Encounter for screening for lipoid disorders: Secondary | ICD-10-CM

## 2018-02-14 DIAGNOSIS — F339 Major depressive disorder, recurrent, unspecified: Secondary | ICD-10-CM

## 2018-02-14 LAB — CBC WITH DIFFERENTIAL/PLATELET
Basophils Absolute: 0 10*3/uL (ref 0.0–0.1)
Basophils Relative: 0.5 % (ref 0.0–3.0)
Eosinophils Absolute: 0.2 10*3/uL (ref 0.0–0.7)
Eosinophils Relative: 2.5 % (ref 0.0–5.0)
HCT: 40.6 % (ref 36.0–46.0)
Hemoglobin: 13.8 g/dL (ref 12.0–15.0)
Lymphocytes Relative: 25.5 % (ref 12.0–46.0)
Lymphs Abs: 1.7 10*3/uL (ref 0.7–4.0)
MCHC: 34 g/dL (ref 30.0–36.0)
MCV: 89.4 fl (ref 78.0–100.0)
Monocytes Absolute: 0.4 10*3/uL (ref 0.1–1.0)
Monocytes Relative: 6.2 % (ref 3.0–12.0)
Neutro Abs: 4.4 10*3/uL (ref 1.4–7.7)
Neutrophils Relative %: 65.3 % (ref 43.0–77.0)
Platelets: 265 10*3/uL (ref 150.0–400.0)
RBC: 4.54 Mil/uL (ref 3.87–5.11)
RDW: 13.4 % (ref 11.5–15.5)
WBC: 6.8 10*3/uL (ref 4.0–10.5)

## 2018-02-14 LAB — COMPREHENSIVE METABOLIC PANEL
ALT: 23 U/L (ref 0–35)
AST: 15 U/L (ref 0–37)
Albumin: 4.1 g/dL (ref 3.5–5.2)
Alkaline Phosphatase: 63 U/L (ref 39–117)
BUN: 15 mg/dL (ref 6–23)
CO2: 27 mEq/L (ref 19–32)
Calcium: 9.4 mg/dL (ref 8.4–10.5)
Chloride: 103 mEq/L (ref 96–112)
Creatinine, Ser: 0.86 mg/dL (ref 0.40–1.20)
GFR: 73.52 mL/min (ref >60.00)
Glucose, Bld: 103 mg/dL — ABNORMAL HIGH (ref 70–99)
Potassium: 4.1 mEq/L (ref 3.5–5.1)
Sodium: 138 mEq/L (ref 135–145)
Total Bilirubin: 0.5 mg/dL (ref 0.2–1.2)
Total Protein: 6.8 g/dL (ref 6.0–8.3)

## 2018-02-14 LAB — LIPID PANEL
Cholesterol: 241 mg/dL — ABNORMAL HIGH (ref 0–200)
HDL: 46.9 mg/dL (ref >39.00)
LDL Cholesterol: 166 mg/dL — ABNORMAL HIGH (ref 0–99)
NonHDL: 194.3
Total CHOL/HDL Ratio: 5
Triglycerides: 140 mg/dL (ref 0.0–149.0)
VLDL: 28 mg/dL (ref 0.0–40.0)

## 2018-02-14 LAB — TSH: TSH: 2.27 u[IU]/mL (ref 0.35–4.50)

## 2018-02-14 LAB — VITAMIN D 25 HYDROXY (VIT D DEFICIENCY, FRACTURES): VITD: 27.51 ng/mL — ABNORMAL LOW (ref 30.00–100.00)

## 2018-02-14 NOTE — Progress Notes (Signed)
Subjective:    Kim Clements is a 52 y.o. female and is here for a comprehensive physical exam.  Health Maintenance Due  Topic Date Due  . HIV Screening  08/30/1980  . TETANUS/TDAP  12/11/2016   Current Outpatient Medications:  .  albuterol (PROVENTIL HFA;VENTOLIN HFA) 108 (90 Base) MCG/ACT inhaler, Inhale into the lungs every 6 (six) hours as needed for wheezing or shortness of breath., Disp: , Rfl:  .  AMBULATORY NON FORMULARY MEDICATION, Allergy Shots 2 shots per week, Disp: , Rfl:  .  amitriptyline (ELAVIL) 10 MG tablet, TAKE 1 TABLET BY MOUTH EVERYDAY AT BEDTIME, Disp: 90 tablet, Rfl: 0 .  bismuth subsalicylate (PEPTO BISMOL) 262 MG/15ML suspension, Take 30 mLs by mouth as needed., Disp: , Rfl:  .  calcium carbonate (TUMS - DOSED IN MG ELEMENTAL CALCIUM) 500 MG chewable tablet, Chew 1 tablet by mouth daily., Disp: , Rfl:  .  clidinium-chlordiazePOXIDE (LIBRAX) 5-2.5 MG capsule, Take 1 capsule by mouth 4 (four) times daily -  before meals and at bedtime., Disp: 360 capsule, Rfl: 3 .  colestipol (COLESTID) 1 g tablet, TAKE 1-2 TABLETS BY MOUTH DAILY., Disp: 90 tablet, Rfl: 3 .  fexofenadine (ALLEGRA) 180 MG tablet, Take 180 mg by mouth daily., Disp: , Rfl:  .  hydrOXYzine (ATARAX/VISTARIL) 25 MG tablet, Take 50 mg by mouth 3 (three) times daily as needed., Disp: , Rfl:  .  Loperamide HCl (IMODIUM A-D PO), Take by mouth as needed., Disp: , Rfl:  .  sertraline (ZOLOFT) 100 MG tablet, TAKE 1 TABLET BY MOUTH EVERY DAY, Disp: 90 tablet, Rfl: 1 .  Simethicone (GAS-X PO), Take by mouth as needed., Disp: , Rfl:  .  VIBERZI 100 MG TABS, TAKE 1 TABLET BY MOUTH TWICE A DAY, Disp: 180 tablet, Rfl: 3  PMHx, SurgHx, SocialHx, Medications, and Allergies were reviewed in the Visit Navigator and updated as appropriate.   Past Medical History:  Diagnosis Date  . Anal fissure   . Anxiety   . Arthritis   . Asthma   . Colon polyps   . Diverticulosis   . GERD (gastroesophageal reflux  disease)   . HLD (hyperlipidemia)   . IBS (irritable bowel syndrome)    Past Surgical History:  Procedure Laterality Date  . ABDOMINAL HYSTERECTOMY    . LAPAROSCOPY  1988   Laser. Indication: Endometriosis.  . TONSILLECTOMY  1971                . Breast cancer Paternal Aunt   . Prostate cancer Paternal Grandfather   . Heart disease Paternal Grandfather   . Colon polyps Mother   . Heart disease Mother   . Rheum arthritis Mother   . Osteoarthritis Mother   . Colon polyps Father   . Stroke Father   . Heart disease Maternal Grandfather   . Heart disease Maternal Grandmother   . Colon cancer Neg Hx    Social History   Tobacco Use  . Smoking status: Never Smoker  . Smokeless tobacco: Never Used  Substance Use Topics  . Alcohol use: No    Alcohol/week: 0.0 oz  . Drug use: No    Review of Systems:   Pertinent items are noted in the HPI. Otherwise, ROS is negative.  Objective:   BP 118/68   Pulse 82   Temp 97.9 F (36.6 C) (Oral)   Ht 5' 1.5" (1.562 m)   Wt 180 lb 9.6 oz (81.9 kg)   SpO2 97%  BMI 33.57 kg/m   General appearance: alert, cooperative and appears stated age. Head: normocephalic, without obvious abnormality, atraumatic. Neck: no adenopathy, supple, symmetrical, trachea midline; thyroid not enlarged, symmetric, no tenderness/mass/nodules. Lungs: clear to auscultation bilaterally. Heart: regular rate and rhythm Abdomen: soft, non-tender; no masses,  no organomegaly. Extremities: extremities normal, atraumatic, no cyanosis or edema. Skin: changing mole on upper back, midline Lymph: cervical, supraclavicular, and axillary nodes normal; no abnormal inguinal nodes palpated. Neurologic: grossly normal.                                      Assessment/Plan:   Diagnoses and all orders for this visit:  Routine physical examination -     CBC with Differential/Platelet -     Comprehensive metabolic panel -     TSH -     Lipid panel -     VITAMIN D 25  Hydroxy (Vit-D Deficiency, Fractures)  Pure hyperglyceridemia  Obesity (BMI 30.0-34.9) -     CBC with Differential/Platelet -     Comprehensive metabolic panel -     TSH  Depression, recurrent (HCC) -     CBC with Differential/Platelet -     Comprehensive metabolic panel  Low vitamin D level -     VITAMIN D 25 Hydroxy (Vit-D Deficiency, Fractures)  Screening for lipid disorders -     Lipid panel  Family history of heart failure -     EKG 12-Lead  Skin lesion -     Dermatology pathology  Procedure Note:   Procedure:  Skin biopsy Indication:  Changing mole (s )  Risks including unsuccessful procedure, bleeding, infection, bruising, scar, a need for another complete procedure and others were explained to the patient in detail as well as the benefits. Informed consent was obtained and signed.   The patient was placed in a decubitus position.  Lesion #1 on  measuring 3 mm  Skin over lesion #1  was prepped with Betadine and alcohol  and anesthetized with 1 cc of 2% lidocaine and epinephrine, using a 25-gauge 1 inch needle.  Shave biopsy with a sterile Dermablade was carried out in the usual fashion. Curette used to destroy the rest of the lesion potentially left behind. Drysol for hemostasis. Band-Aid was applied with antibiotic ointment.   Patient Counseling: [x]    Nutrition: Stressed importance of moderation in sodium/caffeine intake, saturated fat and cholesterol, caloric balance, sufficient intake of fresh fruits, vegetables, fiber, calcium, iron, and 1 mg of folate supplement per day (for females capable of pregnancy).  [x]    Stressed the importance of regular exercise.   [x]    Substance Abuse: Discussed cessation/primary prevention of tobacco, alcohol, or other drug use; driving or other dangerous activities under the influence; availability of treatment for abuse.   [x]    Injury prevention: Discussed safety belts, safety helmets, smoke detector, smoking near bedding or  upholstery.   [x]    Sexuality: Discussed sexually transmitted diseases, partner selection, use of condoms, avoidance of unintended pregnancy  and contraceptive alternatives.  [x]    Dental health: Discussed importance of regular tooth brushing, flossing, and dental visits.  [x]    Health maintenance and immunizations reviewed. Please refer to Health maintenance section.   Helane RimaErica Ayame Rena, DO [facility]

## 2018-02-20 ENCOUNTER — Encounter: Payer: Self-pay | Admitting: Family Medicine

## 2018-03-14 ENCOUNTER — Other Ambulatory Visit: Payer: Self-pay | Admitting: Family Medicine

## 2018-03-24 ENCOUNTER — Other Ambulatory Visit: Payer: Self-pay | Admitting: Gastroenterology

## 2018-03-27 ENCOUNTER — Other Ambulatory Visit: Payer: Self-pay | Admitting: Gastroenterology

## 2018-04-17 ENCOUNTER — Encounter: Payer: Self-pay | Admitting: Family Medicine

## 2018-06-08 ENCOUNTER — Ambulatory Visit: Payer: 59 | Admitting: Psychiatry

## 2018-06-08 DIAGNOSIS — F4323 Adjustment disorder with mixed anxiety and depressed mood: Secondary | ICD-10-CM | POA: Diagnosis not present

## 2018-06-08 NOTE — Patient Instructions (Signed)
Strategies discussed in detail with patient and she wrote them on pad during session to take home with her.  All strategies related to better self-care---mentally, physically, and emotionally.

## 2018-06-08 NOTE — Progress Notes (Signed)
      [facility] Counselor/Therapist Progress Note   Patient ID: Kim Clements, MRN: [id number]  Date: 06/08/2018  Timespent: 60 minutes  Treatment Type: Individual  Subjective: Patient seen today regarding anxiety, depressed mood (has decreased some), stress, and some family situations.  Discussed ways of reframing some of her worrying and negative thought patterns.  Tendency to overthink and worry, however is making some progress. Also looked at triggers to her anxiety and depressed mood in addition to strategies that would be helpful for her.  Strategies include decreasing screen time on her smartphone, going to bed earlier (has been 2:30a.m., but now at 1:00/1:30 a.m.), more physical movement including walking, eating healthier, and casual time with famIily. To work specifically on transforming from worry to being concerned and looking for what might go right versus wrong.  Also wants family to be able to find a church again to be involved in on regular basis.  Motivated but has difficulty implementing some of the things she wants to achieve.  Setting more definite goals as she feels this will help her follow through on goal-directed behavior changes discussed.  Interventions:Solution Focused, Strength-based, Supportive and Reframing  Mental Status Exam:   Appearance:   Neat     Behavior:  Appropriate  Motor:  Normal  Speech/Language:   Normal Rate  Affect:  Appropriate  Mood:  anxious  Thought process:  Coherent  Thought content:    Logical  Perceptual disturbances:    Normal  Orientation:  Full (Time, Place, and Person)  Attention:  Good  Concentration:  good  Memory:  Immediate  Fund of knowledge:   Good  Insight:    Good  Judgment:   Good  Impulse Control:  good    Reported Symptoms: Anxious, concerned re: family issue,depressed (has decreased), stress  Risk Assessment: Danger to Self:  No Self-injurious Behavior: No Danger to Others: No Duty to  Warn:no Physical Aggression / Violence:No  Access to Firearms a concern: No  Gang Involvement:No   Diagnosis:   ICD-10-CM   1. Adjustment disorder with mixed anxiety and depressed mood F43.23      Plan: To see patient again in 2 wks to continue goal-directed treatment.  Mathis Fare, LCSW

## 2018-06-23 ENCOUNTER — Ambulatory Visit: Payer: 59 | Admitting: Psychiatry

## 2018-06-23 DIAGNOSIS — F4323 Adjustment disorder with mixed anxiety and depressed mood: Secondary | ICD-10-CM | POA: Diagnosis not present

## 2018-06-23 NOTE — Progress Notes (Signed)
      [facility] Counselor/Therapist Progress Note   Patient ID: Kim Clements, MRN: [id number]  Date: 06/23/2018  Timespent: 45 minutes  Treatment Type: Individual  Subjective: Patient in today working on an issue within family.  Made joint decision to signiificantly cut back on time spent online. Communication improved some and still working on it.  Continued work on reducing her worry as she is finding that it helps her to feel less anxious and depressed. Also noticing that stomach issues have lessened some with less worrying.  Worked on some re-framing of the way she looks at stressors, building her confidence in managing things that previously created lots of anxiety for patient. Still working on goals and improvement noted.  Will see patient again in 2 weeks.      Interventions:CBT, Solution Focused, Strength-based and Supportive  Mental Status Exam:   Appearance:   Casual     Behavior:  Appropriate and Sharing  Motor:  Normal  Speech/Language:   Normal Rate  Affect:  Congruent  Mood:  anxious  Thought process:  Coherent  Thought content:    Logical  Perceptual disturbances:    Normal  Orientation:  Full (Time, Place, and Person)  Attention:  Good  Concentration:  good  Memory:  Immediate  Fund of knowledge:   Good  Insight:    Good  Judgment:   Good  Impulse Control:  good    Reported Symptoms:  Anxiety, some depressed mood (some better), family issues, stressful,   Risk Assessment: Danger to Self:  No Self-injurious Behavior: No Danger to Others: No Duty to Warn:no Physical Aggression / Violence:No  Access to Firearms a concern: No  Gang Involvement:No   Diagnosis:   ICD-10-CM   1. Adjustment disorder with mixed anxiety and depressed mood F43.23      Plan: Plan to see  Patient in 2 weeks to continue goal-directed treatment.  Mathis Fare, LCSW

## 2018-07-06 ENCOUNTER — Other Ambulatory Visit: Payer: Self-pay | Admitting: Gastroenterology

## 2018-07-06 ENCOUNTER — Telehealth: Payer: Self-pay | Admitting: Gastroenterology

## 2018-07-06 ENCOUNTER — Ambulatory Visit: Payer: 59 | Admitting: Psychiatry

## 2018-07-06 NOTE — Telephone Encounter (Signed)
Please advise if okay to refill both?

## 2018-07-06 NOTE — Telephone Encounter (Signed)
May we refill, last see 12/2016.

## 2018-07-07 ENCOUNTER — Other Ambulatory Visit: Payer: Self-pay

## 2018-07-07 DIAGNOSIS — K580 Irritable bowel syndrome with diarrhea: Secondary | ICD-10-CM

## 2018-07-07 MED ORDER — ELUXADOLINE 100 MG PO TABS: 1.0000 | ORAL_TABLET | Freq: Two times a day (BID) | ORAL | 0 refills | Status: DC

## 2018-07-07 MED ORDER — CILIDINIUM-CHLORDIAZEPOXIDE 2.5-5 MG PO CAPS: 1.0000 | ORAL_CAPSULE | Freq: Three times a day (TID) | ORAL | 0 refills | Status: DC

## 2018-07-07 NOTE — Telephone Encounter (Signed)
Pt need refills on her meds states that she is going out of town on Sunday and would like to have her meds sent to pharmacy to pick them up today.

## 2018-07-07 NOTE — Telephone Encounter (Signed)
Ok to send 30 day supply for librax and Viberzi 75mg  daily. There is increased incidence of pancreatitis with viberzi 100mg  daily. Please schedule follow up visit with either myself or extender next available appt. Thanks

## 2018-07-21 ENCOUNTER — Ambulatory Visit: Payer: 59 | Admitting: Psychiatry

## 2018-07-21 DIAGNOSIS — F4323 Adjustment disorder with mixed anxiety and depressed mood: Secondary | ICD-10-CM | POA: Diagnosis not present

## 2018-07-21 NOTE — Progress Notes (Signed)
      [facility] Counselor/Therapist Progress Note   Patient ID: Kim LivingsCatherine Fulton Duggar, MRN: [id number]  Date: 07/21/2018  Timespent:  60 minutes   Treatment Type: Individual   Reported Symptoms: Fatigue and anxiety, stressed   Mental Status Exam:    Appearance:   Casual     Behavior:  Appropriate and Sharing  Motor:  Normal  Speech/Language:   Normal Rate  Affect:  Appropriate  Mood:  anxious  Thought process:  normal  Thought content:    WNL  Sensory/Perceptual disturbances:    WNL  Orientation:  oriented to person, place, time/date, situation, day of week, month of year and year  Attention:  Good  Concentration:  Good  Memory:  WNL  Fund of knowledge:   Good  Insight:    Good  Judgment:   Good  Impulse Control:  Good     Risk Assessment: Danger to Self:  No Self-injurious Behavior: No Danger to Others: No Duty to Warn:no Physical Aggression / Violence:No  Access to Firearms a concern: No  Gang Involvement:No    Subjective: Patient in today reporting anxiety and stress re: home and personal.  Processes her concerns at length.  Strategies discussed to help her deal with her stress in more positive ways.  Encouraged to continue decreasing her worries and looking more for what might go right versus wrong   Interventions: Solution-Oriented/Positive Psychology and Ego-Supportive   Diagnosis:   ICD-10-CM   1. Adjustment disorder with mixed anxiety and depressed mood F43.23      Plan: Patient to practice more of the strategies to decrease worrying and looking for positive outcomes versus negative.  Encouraged re: self-care including physical, mental, spiritual, and emotional.  Goal review with progress noted.     Mathis Fareeborah Bettyjo Lundblad, LCSW

## 2018-07-27 NOTE — Telephone Encounter (Signed)
Beth sent in these refills on November 1st.

## 2018-07-27 NOTE — Telephone Encounter (Signed)
Ok to refill 

## 2018-08-09 ENCOUNTER — Ambulatory Visit: Payer: 59 | Admitting: Psychiatry

## 2018-08-11 ENCOUNTER — Other Ambulatory Visit: Payer: Self-pay | Admitting: *Deleted

## 2018-08-11 ENCOUNTER — Telehealth: Payer: Self-pay | Admitting: *Deleted

## 2018-08-11 ENCOUNTER — Ambulatory Visit: Payer: 59 | Admitting: Psychiatry

## 2018-08-11 NOTE — Telephone Encounter (Signed)
Patient has an appointment on 08/16/2018 for refills  Sent in faxed rx to [facility] today for Viberzi

## 2018-08-16 ENCOUNTER — Ambulatory Visit (INDEPENDENT_AMBULATORY_CARE_PROVIDER_SITE_OTHER): Payer: 59 | Admitting: Gastroenterology

## 2018-08-16 ENCOUNTER — Other Ambulatory Visit: Payer: Self-pay | Admitting: Gastroenterology

## 2018-08-16 ENCOUNTER — Encounter: Payer: Self-pay | Admitting: Gastroenterology

## 2018-08-16 VITALS — BP 110/70 | HR 64 | Ht 61.5 in | Wt 179.6 lb

## 2018-08-16 DIAGNOSIS — R143 Flatulence: Secondary | ICD-10-CM | POA: Diagnosis not present

## 2018-08-16 DIAGNOSIS — K580 Irritable bowel syndrome with diarrhea: Secondary | ICD-10-CM

## 2018-08-16 DIAGNOSIS — E739 Lactose intolerance, unspecified: Secondary | ICD-10-CM | POA: Diagnosis not present

## 2018-08-16 DIAGNOSIS — R140 Abdominal distension (gaseous): Secondary | ICD-10-CM | POA: Diagnosis not present

## 2018-08-16 MED ORDER — ELUXADOLINE 75 MG PO TABS: 75.0000 mg | ORAL_TABLET | Freq: Two times a day (BID) | ORAL | 2 refills | Status: DC

## 2018-08-16 NOTE — Patient Instructions (Addendum)
We have sent a new prescription of Vibrezi to your pharmacy with the dosage decrease   Continue Colestid  Follow up in 1 year   Lactose-Free Diet, Adult If you have lactose intolerance, you are not able to digest lactose. Lactose is a natural sugar found mainly in milk and milk products. You may need to avoid all foods and beverages that contain lactose. A lactose-free diet can help you do this. What do I need to know about this diet?  Do not consume foods, beverages, vitamins, minerals, or medicines with lactose. Read ingredients lists carefully.  Look for the words "lactose-free" on labels.  Use lactase enzyme drops or tablets as directed by your health care provider.  Use lactose-free milk or a milk alternative, such as soy milk, for drinking and cooking.  Make sure you get enough calcium and vitamin D in your diet. A lactose-free eating plan can be lacking in these important nutrients.  Take calcium and vitamin D supplements as directed by your health care provider. Talk to your provider about supplements if you are not able to get enough calcium and vitamin D from food. Which foods have lactose? Lactose is found in:  Milk and foods made from milk.  Yogurt.  Cheese.  Butter.  Margarine.  Sour cream.  Cream.  Whipped toppings and nondairy creamers.  Ice cream and other milk-based desserts.  Lactose is also found in foods or products made with milk or milk ingredients. To find out whether a food contains milk or a milk ingredient, look at the ingredients list. Avoid foods with the statement "May contain milk" and foods that contain:  Butter.  Cream.  Milk.  Milk solids.  Milk powder.  Whey.  Curd.  Caseinate.  Lactose.  Lactalbumin.  Lactoglobulin.  What are some alternatives to milk and foods made with milk products?  Lactose-free milk.  Soy milk with added calcium and vitamin D.  Almond, coconut, or rice milk with added calcium and vitamin  D. Note that these are low in protein.  Soy products, such as soy yogurt, soy cheese, soy ice cream, and soy-based sour cream. Which foods can I eat? Grains Breads and rolls made without milk, such as [place], [place], or [place] bread, bagels, pita, and [employer]. Corn tortillas, corn meal, grits, and polenta. Crackers without lactose or milk solids, such as soda crackers and graham crackers. Cooked or dry cereals without lactose or milk solids. Pasta, quinoa, couscous, barley, oats, bulgur, farro, rice, wild rice, or other grains prepared without milk or lactose. Plain popcorn. Vegetables Fresh, frozen, and canned vegetables without cheese, cream, or butter sauces. Fruits All fresh, canned, frozen, or dried fruits that are not processed with lactose. Meats and Other Protein Sources Plain beef, chicken, fish, [place], lamb, veal, pork, wild game, or ham. Kosher-prepared meat products. Strained or junior meats that do not contain milk. Eggs. Soy meat substitutes. Beans, lentils, and hummus. Tofu. Nuts and seeds. Peanut or other nut butters without lactose. Soups, casseroles, and mixed dishes without cheese, cream, or milk. Dairy Lactose-free milk. Soy, rice, or almond milk with added calcium and vitamin D. Soy cheese and yogurt. Beverages Carbonated drinks. Tea. Coffee, freeze-dried coffee, and some instant coffees. Fruit and vegetable juices. Condiments Soy sauce. Carob powder. Olives. Gravy made with water. Baker's cocoa. Rosita Fire. Pure seasonings and spices. Ketchup. Mustard. Bouillon. Broth. Sweets and Desserts Water and fruit ices. Gelatin. Cookies, pies, or cakes made from allowed ingredients, such as angel food cake. Pudding made with water  or a milk substitute. Lactose-free tofu desserts. Soy, coconut milk, or rice-milk-based frozen desserts. Sugar. Honey. Jam, jelly, and marmalade. Molasses. Pure sugar candy. Dark chocolate without milk. Marshmallows. Fats and Oils Margarines and salad  dressings that do not contain milk. Tomasa BlaseBacon. Vegetable oils. Shortening. Mayonnaise. Soy or coconut-based cream. The items listed above may not be a complete list of recommended foods or beverages. Contact your dietitian for more options. Which foods are not recommended? Grains Breads and rolls that contain milk. Toaster pastries. Muffins, biscuits, waffles, cornbread, and pancakes. These can be prepared at home, commercial, or from mixes. Sweet rolls, donuts, English muffins, fry bread, lefse, flour tortillas with lactose, or [place] toast made with milk or milk ingredients. Crackers that contain lactose. Corn curls. Cooked or dry cereals with lactose. Vegetables Creamed or breaded vegetables. Vegetables in a cheese or butter sauce or with lactose-containing margarines. Instant potatoes. [place] fries. Scalloped or au gratin potatoes. Fruits None. Meats and Other Protein Sources Scrambled eggs, omelets, and souffles that contain milk. Creamed or breaded meat, fish, chicken, or [place]. Sausage products, such as wieners and liver sausage. Cold cuts that contain milk solids. Cheese, cottage cheese, ricotta cheese, and cheese spreads. Lasagna and macaroni and cheese. Pizza. Peanut or other nut butters with added milk solids. Casseroles or mixed dishes containing milk or cheese. Dairy All dairy products, including milk, goat's milk, buttermilk, kefir, acidophilus milk, flavored milk, evaporated milk, condensed milk, dulce de [place], eggnog, yogurt, cheese, and cheese spreads. Beverages Hot chocolate. Cocoa with lactose. Instant iced teas. Powdered fruit drinks. Smoothies made with milk or yogurt. Condiments Chewing gum that has lactose. Cocoa that has lactose. Spice blends if they contain milk products. Artificial sweeteners that contain lactose. Nondairy creamers. Sweets and Desserts Ice cream, ice milk, gelato, sherbet, and frozen yogurt. Custard, pudding, and mousse. Cake, cream pies, cookies, and other  desserts containing milk, cream, cream cheese, or milk chocolate. Pie crust made with milk-containing margarine or butter. Reduced-calorie desserts made with a sugar substitute that contains lactose. Toffee and butterscotch. Milk, white, or dark chocolate that contains milk. Fudge. Caramel. Fats and Oils Margarines and salad dressings that contain milk or cheese. Cream. Half and half. Cream cheese. Sour cream. Chip dips made with sour cream or yogurt. The items listed above may not be a complete list of foods and beverages to avoid. Contact your dietitian for more information. Am I getting enough calcium? Calcium is found in many foods that contain lactose and is important for bone health. The amount of calcium you need depends on your age:  Adults younger than 50 years: 1000 mg of calcium a day.  Adults older than 50 years: 1200 mg of calcium a day.  If you are not getting enough calcium, other calcium sources include:  Orange juice with calcium added. There are 300-350 mg of calcium in 1 cup of orange juice.  Sardines with edible bones. There are 325 mg of calcium in 3 oz of sardines.  Calcium-fortified soy milk. There are 300-400 mg of calcium in 1 cup of calcium-fortified soy milk.  Calcium-fortified rice or almond milk. There are 300 mg of calcium in 1 cup of calcium-fortified rice or almond milk.  Canned salmon with edible bones. There are 180 mg of calcium in 3 oz of canned salmon with edible bones.  Calcium-fortified breakfast cereals. There are [phone] mg of calcium in calcium-fortified breakfast cereals.  Tofu set with calcium sulfate. There are 250 mg of calcium in  cup of tofu  set with calcium sulfate.  Spinach, cooked. There are 145 mg of calcium in  cup of cooked spinach.  Edamame, cooked. There are 130 mg of calcium in  cup of cooked edamame.  Collard greens, cooked. There are 125 mg of calcium in  cup of cooked collard greens.  Kale, frozen or cooked. There are  90 mg of calcium in  cup of cooked or frozen kale.  Almonds. There are 95 mg of calcium in  cup of almonds.  Broccoli, cooked. There are 60 mg of calcium in 1 cup of cooked broccoli.  This information is not intended to replace advice given to you by your health care provider. Make sure you discuss any questions you have with your health care provider. Document Released: 02/12/2002 Document Revised: 01/29/2016 Document Reviewed: 11/23/2013 Elsevier Interactive Patient Education  2018 [employer].

## 2018-08-16 NOTE — Telephone Encounter (Signed)
ok 

## 2018-08-16 NOTE — Progress Notes (Signed)
Kim Clements    [id number]    1966-03-22  Primary Care Physician:Wallace, Alcario Drought, DO  Referring Physician: Helane Rima, DO [address] [place], [place] [address]  Chief complaint: Irritable bowel syndrome-diarrhea  HPI: 52 year old female with history of irritable bowel syndrome predominant diarrhea here for follow-up visit She is taking Viberzi 100 mg twice daily and also Colestid 1 g daily.  She is having overall improvement of symptoms with occasional diarrhea once every few months.  She had an episode last week after she had [place] food, cheese quesadilla and also had a large bowl of ice cream following that.  Has abdominal cramping, bloating and excessive flatulence.  Denies abdominal pain, nausea, vomiting, dysphagia, loss of appetite or weight loss. She is trying to cut down sugar intake and decrease the amount in the sweet tea Occasional small-volume bright red blood per rectum when she wipes, denies any rectal pain or discomfort.  Colonoscopy February 12, 2016 showed sigmoid diverticulosis internal hemorrhoids otherwise normal exam  Colonoscopy Jan 09, 2010 by Dr. Ewing Schlein at [facility] sigmoid diverticulosis otherwise normal exam  Colonoscopy October 20 2004 by Dr. [facility].  Terminal ileum biopsies normal mucosa.  2 small polypoid appearing polyps were removed with biopsy forceps showed benign colonic mucosa.  Sigmoid diverticulosis   Outpatient Encounter Medications as of 08/16/2018  Medication Sig  . albuterol (PROVENTIL HFA;VENTOLIN HFA) 108 (90 Base) MCG/ACT inhaler Inhale into the lungs every 6 (six) hours as needed for wheezing or shortness of breath.  . AMBULATORY NON FORMULARY MEDICATION Allergy Shots 2 shots per week  . amitriptyline (ELAVIL) 10 MG tablet TAKE 1 TABLET BY MOUTH EVERYDAY AT BEDTIME  . bismuth subsalicylate (PEPTO BISMOL) 262 MG/15ML suspension Take 30 mLs by mouth as needed.  . calcium carbonate (TUMS - DOSED IN MG  ELEMENTAL CALCIUM) 500 MG chewable tablet Chew 1 tablet by mouth daily.  . Cholecalciferol (VITAMIN D3) 25 MCG (1000 UT) CAPS Take 1 capsule by mouth daily.  . clidinium-chlordiazePOXIDE (LIBRAX) 5-2.5 MG capsule Take 1 capsule by mouth 4 (four) times daily -  before meals and at bedtime. Appointment for further refills  . colestipol (COLESTID) 1 g tablet TAKE 1-2 TABLETS BY MOUTH DAILY.  Marland Kitchen Eluxadoline (VIBERZI) 100 MG TABS Take 1 tablet (100 mg total) by mouth 2 (two) times daily. Appointment for further refills  . fexofenadine (ALLEGRA) 180 MG tablet Take 180 mg by mouth daily.  . Loperamide HCl (IMODIUM A-D PO) Take by mouth as needed.  . Simethicone (GAS-X PO) Take by mouth as needed.  . [DISCONTINUED] colestipol (COLESTID) 1 g tablet TAKE 1-2 TABLETS BY MOUTH DAILY.  . [DISCONTINUED] sertraline (ZOLOFT) 100 MG tablet TAKE 1 TABLET BY MOUTH EVERY DAY   No facility-administered encounter medications on file as of 08/16/2018.     Allergies as of 08/16/2018 - Review Complete 08/16/2018  Allergen Reaction Noted  . Erythromycin  08/22/2015  . Penicillins  08/22/2015  . Sulfa antibiotics  08/22/2015    Past Medical History:  Diagnosis Date  . Anal fissure   . Anxiety   . Arthritis   . Asthma   . Colon polyps   . Diverticulosis   . GERD (gastroesophageal reflux disease)   . HLD (hyperlipidemia)   . IBS (irritable bowel syndrome)     Past Surgical History:  Procedure Laterality Date  . ABDOMINAL HYSTERECTOMY    . LAPAROSCOPY  1988   Laser. Indication: Endometriosis.  Marland Kitchen  TONSILLECTOMY  1971    Family History  Problem Relation Age of Onset  . Breast cancer Paternal Grandmother        all of paternal females  . Breast cancer Paternal Aunt   . Prostate cancer Paternal Grandfather   . Heart disease Paternal Grandfather   . Colon polyps Mother   . Heart disease Mother   . Rheum arthritis Mother   . Osteoarthritis Mother   . Colon polyps Father   . Stroke Father   . Heart  disease Maternal Grandfather   . Heart disease Maternal Grandmother   . Colon cancer Neg Hx     Social History   Socioeconomic History  . Marital status: Married    Spouse name: Not on file  . Number of children: 1  . Years of education: Not on file  . Highest education level: Not on file  Occupational History  . 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 Financial resource strain: Not on file  . Food insecurity:    Worry: Not on file    Inability: Not on file  . Transportation needs:    Medical: Not on file    Non-medical: Not on file  Tobacco Use  . Smoking status: Never Smoker  . Smokeless tobacco: Never Used  Substance and Sexual Activity  . Alcohol use: No    Alcohol/week: 0.0 standard drinks  . Drug use: No  . Sexual activity: Not on file  Lifestyle  . Physical activity:    Days per week: Not on file    Minutes per session: Not on file  . Stress: Not on file  Relationships  . Social connections:    Talks on phone: Not on file    Gets together: Not on file    Attends religious service: Not on file    Active member of club or organization: Not on file    Attends meetings of clubs or organizations: Not on file    Relationship status: Not on file  . Intimate partner violence:    Fear of current or ex partner: Not on file    Emotionally abused: Not on file    Physically abused: Not on file    Forced sexual activity: Not on file  Other Topics Concern  . Not on file  Social History Narrative  . Not on file      Review of systems: Review of Systems  Constitutional: Negative for fever and chills.  Positive for lack of energy HENT: Negative.   Eyes: Negative for blurred vision.  Respiratory: Negative for cough, shortness of breath and wheezing.   Cardiovascular: Negative for chest pain and palpitations.  Gastrointestinal: as per HPI Genitourinary: Negative for dysuria, urgency, frequency and hematuria.  Musculoskeletal: Active for myalgias, back pain  and joint pain.  Skin: Negative for itching .  Neurological: Negative for dizziness, tremors, focal weakness, seizures and loss of consciousness.  Endo/Heme/Allergies: Positive for seasonal allergies.  Psychiatric/Behavioral: Negative for suicidal ideas and hallucinations.  Positive for depression and anxiety All other systems reviewed and are negative.   Physical Exam: Vitals:   08/16/18 1408  BP: 110/70  Pulse: 64   Body mass index is 33.39 kg/m. Gen:      No acute distress HEENT:  EOMI, sclera anicteric Neck:     No masses; no thyromegaly Lungs:    Clear to auscultation bilaterally; normal respiratory effort CV:         Regular rate and rhythm; no murmurs  Abd:      + bowel sounds; soft, non-tender; no palpable masses, no distension Ext:    No edema; adequate peripheral perfusion Skin:      Warm and dry; no rash Neuro: alert and oriented x 3 Psych: normal mood and affect  Data Reviewed:  Reviewed labs, radiology imaging, old records and pertinent past GI work up   Assessment and Plan/Recommendations:  52 year old female with irritable bowel syndrome predominant diarrhea here for follow-up visit Intermittent bright red blood per rectum, small-volume likely secondary to bleeding from internal hemorrhoids Advised patient to avoid excessive straining/ sitting on toilet for prolonged period of time Preparation H suppository per rectum at bedtime as needed  IBS-D: We will decrease dose of Viberzi to 75 mg twice daily. Continue Colestid 1 g daily at bedtime Patient would like to wean off the medications, will discontinue Colestid for 2 weeks  and see has any change in symptoms.  Subsequently will try to decrease Viberzi to once daily for a month and then stop . If she develops recurrent symptoms we will plan to restart the medication   Discussed lactose-free diet and this could be exacerbating symptoms causing bloating, flatulence and intermittent diarrhea  Due for colonoscopy  for colorectal cancer screening in 2027  Return in 1 year or sooner if needed  40 minutes was spent face-to-face with the patient. Greater than 50% of the time used for counseling as well as treatment plan and follow-up. She had multiple questions which were answered to her satisfaction  K. Scherry RanVeena Amanda Steuart , MD [phone]    CC: Helane RimaWallace, Erica, DO

## 2018-08-16 NOTE — Telephone Encounter (Signed)
Librax refills? Is it ok to refill

## 2018-08-24 ENCOUNTER — Ambulatory Visit: Payer: 59 | Admitting: Psychiatry

## 2018-09-05 ENCOUNTER — Ambulatory Visit: Payer: 59 | Admitting: Psychiatry

## 2018-09-05 DIAGNOSIS — F4323 Adjustment disorder with mixed anxiety and depressed mood: Secondary | ICD-10-CM

## 2018-09-05 NOTE — Progress Notes (Signed)
      [facility] Counselor/Therapist Progress Note  Patient ID: Kim Clements, MRN: [id number],    Date: 09/05/2018  Time Spent: 60 minutes  Treatment Type: Individual Therapy  Reported Symptoms:  Anxiety, stressed, difficulty looking for what may go wrong versus right  Mental Status Exam:  Appearance:   Casual     Behavior:  Appropriate and Sharing  Motor:  Normal  Speech/Language:   Normal Rate  Affect:  Congruent  Mood:  anxious and depressed  Thought process:  normal  Thought content:    WNL  Sensory/Perceptual disturbances:    WNL  Orientation:  oriented to person, place, time/date, situation, day of week, month of year and year  Attention:  Good  Concentration:  Good  Memory:  WNL  Fund of knowledge:   Good  Insight:    Good  Judgment:   Good  Impulse Control:  Good   Risk Assessment: Danger to Self:  No Self-injurious Behavior: No Danger to Others: No Duty to Warn:no Physical Aggression / Violence:No  Access to Firearms a concern: No  Gang Involvement:No   Subjective:  Patient in today feeling stressed and anxious, also with increased worrying physical issue. Had some gastrointestinal issues recently and saw Dr.  Was told it could likely be diet-related and this seemed to spike a return in elevated anxiety symptoms for patient.  Overthinking what might go wrong and tendency to worry more than in recent weeks.  Worked today on reframing and reducing her anxious thoughts, replacing them with more positive and more realistic thoughts. Also working on "staying in the present" and not jumping ahead to worry about things that "could happen".    Interventions: Cognitive Behavioral Therapy and Solution-Oriented/Positive Psychology  Diagnosis:   ICD-10-CM   1. Adjustment disorder with mixed anxiety and depressed mood F43.23     Plan:  Patient to follow up in practicing strategies reviewed today to help manage anxiety and stress, as well as staying in the  present, and choosing to look for the positive versus negative.  To return in approx  2 wks.    Kim Fareeborah Hae Ahlers, LCSW

## 2018-09-21 ENCOUNTER — Telehealth: Payer: Self-pay | Admitting: Gastroenterology

## 2018-09-21 ENCOUNTER — Ambulatory Visit: Payer: 59 | Admitting: Psychiatry

## 2018-09-21 DIAGNOSIS — F4323 Adjustment disorder with mixed anxiety and depressed mood: Secondary | ICD-10-CM

## 2018-09-21 NOTE — Telephone Encounter (Signed)
Pt's last OV 12.11.20 with Dr. Lavon Paganini.  Pt states that Dr. Lavon Paganini suspended pt has lactose intolerance.  Pt reported that she has been documenting what she eats and agrees. Pt would like to know how to proceed whether she needs testing or to take supplements.  Please advise.

## 2018-09-21 NOTE — Progress Notes (Signed)
      [facility] Counselor/Therapist Progress Note  Patient ID: Kim Clements, MRN: [id number],    Date: 09/21/2018  Time Spent:   58 minutes  Treatment Type: Individual Therapy  Reported Symptoms: anxiety, stress, depression (but "is better"), family concerns  Mental Status Exam:  Appearance:   Casual     Behavior:  Appropriate and Sharing  Motor:  Normal  Speech/Language:   Normal Rate  Affect:  Depressed and anxious  Mood:  anxious  Thought process:  normal  Thought content:    WNL  Sensory/Perceptual disturbances:    WNL  Orientation:  oriented to person, place, time/date, situation, day of week, month of year and year  Attention:  Good  Concentration:  Good  Memory:  WNL  Fund of knowledge:   Good  Insight:    Good  Judgment:   Good  Impulse Control:  Good   Risk Assessment: Danger to Self:  No Self-injurious Behavior: No Danger to Others: No Duty to Warn:no Physical Aggression / Violence:No  Access to Firearms a concern: No  Gang Involvement:No   Subjective: Patient in today with anxiety, some depression.  Stressed and having family concerns which she process today during session.  Able to share her concerns and look at ways of handling her feelings in more positive ways.  Working to keep her "worrying" more to a minimum and let herself be "concerned" which allows for a more positive approach to dealing with issues, as worrying tends to take her in a negative direction.  Review of strategies for managing anxiety and depression, and also reinforced her need to set limits as needed as part of her self-care.  Interventions: Cognitive Behavioral Therapy and Ego-Supportive  Diagnosis:   ICD-10-CM   1. Adjustment disorder with mixed anxiety and depressed mood F43.23     Plan: Patient to practice strategies discussed in session to help manage her anxiety, depression, and concerns for her family.  To be more proactive and consistent in her self-care,  including eating healthier, getting more consistent sleep, and exercise.  To see again in 2 wks.  Mathis Fare, LCSW

## 2018-09-25 NOTE — Telephone Encounter (Signed)
We can test for lactose intolerance with breath test but is unnecessary and expensive.  Advise patient to limit lactose intake as much as possible, please send her a list of lactose free diet. She can take Lactaid tablets (available OTC) as needed to prevents the symptoms if she consumes any dairy products.

## 2018-09-25 NOTE — Telephone Encounter (Signed)
Ok if patient wants to get confirmatory test, we can order Lactose breath test. Thanks

## 2018-09-25 NOTE — Telephone Encounter (Signed)
Spoke with the patient.  She will try the Lactaid. She wants to see if it makes a difference to help confirm the diagnosis.  She is also curious if being off the Zoloft makes a difference. She developed the symptoms after weaning off of Zoloft. She is not interested in restarting it necessarily.  Thanks!

## 2018-09-26 NOTE — Telephone Encounter (Signed)
Offered. She is going to try Lactaid with the cheese. Talk again.

## 2018-09-29 NOTE — Telephone Encounter (Signed)
Spoke with the patient. She is going to continue with diet control of her symptoms.

## 2018-10-05 ENCOUNTER — Ambulatory Visit: Payer: 59 | Admitting: Psychiatry

## 2018-10-05 DIAGNOSIS — F4323 Adjustment disorder with mixed anxiety and depressed mood: Secondary | ICD-10-CM | POA: Diagnosis not present

## 2018-10-05 NOTE — Progress Notes (Signed)
      [facility] Counselor/Therapist Progress Note  Patient ID: Kim Clements, MRN: [id number],    Date: 10/05/2018  Time Spent:   60  minutes  Treatment Type: Individual Therapy   Reported Symptoms: anxiety, stress, depression lessened some, family concerns  Mental Status Exam:  Appearance:   Casual     Behavior:  Appropriate and Sharing  Motor:  Normal  Speech/Language:   Normal Rate  Affect:  Depressed and anxious  Mood:  anxious  Thought process:  normal  Thought content:    WNL  Sensory/Perceptual disturbances:    WNL  Orientation:  oriented to person, place, time/date, situation, day of week, month of year and year  Attention:  Good  Concentration:  Good  Memory:  WNL  Fund of knowledge:   Good  Insight:    Good  Judgment:   Good  Impulse Control:  Good   Risk Assessment: Danger to Self:  No Self-injurious Behavior: No Danger to Others: No Duty to Warn:no Physical Aggression / Violence:No  Access to Firearms a concern: No  Gang Involvement:No   Subjective:   Stressed and having family concerns which she process today during session.  Able to share her concerns and look at ways of handling her feelings in more positive ways.  Working to keep her "worrying" more to a minimum and let herself be "concerned" which allows for a more positive approach to dealing with issues, as worrying tends to take her in a negative direction.  Review of strategies for managing anxiety and depression, and also reinforced her need to set limits as needed as part of her self-care.  Interventions: Cognitive Behavioral Therapy and Ego-Supportive  Diagnosis:   ICD-10-CM   1. Adjustment disorder with mixed anxiety and depressed mood F43.23     Plan: ( Treatment Goal Plan review noted below)  Patient to practice strategies discussed in session to help manage her anxiety, depression, and concerns for her family.  To be more proactive and consistent in her self-care, including  eating healthier, getting more consistent sleep, and exercise.  To see again in 2 wks.  *Treatment Goal Review (initial treatment plan was prior to going on Epic).  Documented in Flowsheets section.  Mathis Fare, LCSW

## 2018-10-20 ENCOUNTER — Ambulatory Visit: Payer: 59 | Admitting: Psychiatry

## 2018-10-20 DIAGNOSIS — F4323 Adjustment disorder with mixed anxiety and depressed mood: Secondary | ICD-10-CM

## 2018-10-20 NOTE — Progress Notes (Signed)
      [facility] Counselor/Therapist Progress Note  Patient ID: Kim Clements, MRN: [id number],    Date: 10/20/2018  Time Spent:   58  minutes  Treatment Type: Individual Therapy   Reported Symptoms: anxiety, stress, depression lessened some, family concerns  Mental Status Exam:  Appearance:   Casual     Behavior:  Appropriate and Sharing  Motor:  Normal  Speech/Language:   Normal Rate  Affect:  anxious  Mood:  anxious  Thought process:  normal  Thought content:    WNL  Sensory/Perceptual disturbances:    WNL  Orientation:  oriented to person, place, time/date, situation, day of week, month of year and year  Attention:  Good  Concentration:  Good  Memory:  WNL  Fund of knowledge:   Good  Insight:    Good  Judgment:   Good  Impulse Control:  Good   Risk Assessment: Danger to Self:  No Self-injurious Behavior: No Danger to Others: No Duty to Warn:no Physical Aggression / Violence:No  Access to Firearms a concern: No  Gang Involvement:No   Subjective:   On a 1-10 scale of anxiety, she puts herself at a 5 or 6.  Gets very anxious with so much flu going around.  Anxious about daughter's upcoming senior year.  Talked in detail about both of these issues and also looked at some strategies that might help, including relaxation/mindfulness and CBT strategies.   Still some stress and having family concerns which she processes more  today during session.  Did look at ways of handling her feelings in more positive ways.  Stressed with fears of flu came up again in session and we focused on decreasing her "worry cycle"  which allows for a more positive approach to dealing with issues.  Does see that her worrying always leads in negative directions.   Interventions: Cognitive Behavioral Therapy and Ego-Supportive  Diagnosis:   ICD-10-CM   1. Adjustment disorder with mixed anxiety and depressed mood F43.23     Plan:   Patient to practice strategies discussed in  session to help manage her anxiety, depression, and concerns for her family, and in decreasing her worry.  To be more proactive and consistent in her self-care, including eating healthier, getting more consistent sleep, and exercise.  To see again in 2 wks.  Mathis Fare, LCSW

## 2018-10-29 ENCOUNTER — Encounter: Payer: Self-pay | Admitting: Family Medicine

## 2018-11-01 ENCOUNTER — Encounter: Payer: Self-pay | Admitting: Physician Assistant

## 2018-11-01 ENCOUNTER — Other Ambulatory Visit: Payer: Self-pay | Admitting: Family Medicine

## 2018-11-01 ENCOUNTER — Ambulatory Visit (INDEPENDENT_AMBULATORY_CARE_PROVIDER_SITE_OTHER): Payer: 59 | Admitting: Physician Assistant

## 2018-11-01 ENCOUNTER — Other Ambulatory Visit (INDEPENDENT_AMBULATORY_CARE_PROVIDER_SITE_OTHER): Payer: 59

## 2018-11-01 VITALS — BP 126/80 | HR 97 | Ht 61.0 in | Wt 174.2 lb

## 2018-11-01 DIAGNOSIS — K580 Irritable bowel syndrome with diarrhea: Secondary | ICD-10-CM

## 2018-11-01 DIAGNOSIS — R634 Abnormal weight loss: Secondary | ICD-10-CM

## 2018-11-01 DIAGNOSIS — R42 Dizziness and giddiness: Secondary | ICD-10-CM

## 2018-11-01 DIAGNOSIS — R1084 Generalized abdominal pain: Secondary | ICD-10-CM | POA: Diagnosis not present

## 2018-11-01 DIAGNOSIS — R112 Nausea with vomiting, unspecified: Secondary | ICD-10-CM

## 2018-11-01 LAB — COMPREHENSIVE METABOLIC PANEL
ALK PHOS: 75 U/L (ref 39–117)
ALT: 26 U/L (ref 0–35)
AST: 13 U/L (ref 0–37)
Albumin: 4.3 g/dL (ref 3.5–5.2)
BUN: 18 mg/dL (ref 6–23)
CO2: 29 mEq/L (ref 19–32)
Calcium: 9.6 mg/dL (ref 8.4–10.5)
Chloride: 102 mEq/L (ref 96–112)
Creatinine, Ser: 1.32 mg/dL — ABNORMAL HIGH (ref 0.40–1.20)
GFR: 42.07 mL/min — ABNORMAL LOW (ref >60.00)
Glucose, Bld: 87 mg/dL (ref 70–99)
POTASSIUM: 3.8 meq/L (ref 3.5–5.1)
Sodium: 138 mEq/L (ref 135–145)
TOTAL PROTEIN: 6.9 g/dL (ref 6.0–8.3)
Total Bilirubin: 0.4 mg/dL (ref 0.2–1.2)

## 2018-11-01 LAB — CBC WITH DIFFERENTIAL/PLATELET
Basophils Absolute: 0.1 10*3/uL (ref 0.0–0.1)
Basophils Relative: 0.9 % (ref 0.0–3.0)
EOS ABS: 0.7 10*3/uL (ref 0.0–0.7)
Eosinophils Relative: 8.7 % — ABNORMAL HIGH (ref 0.0–5.0)
HCT: 42 % (ref 36.0–46.0)
Hemoglobin: 14.5 g/dL (ref 12.0–15.0)
Lymphocytes Relative: 26 % (ref 12.0–46.0)
Lymphs Abs: 2.1 10*3/uL (ref 0.7–4.0)
MCHC: 34.4 g/dL (ref 30.0–36.0)
MCV: 88.9 fl (ref 78.0–100.0)
Monocytes Absolute: 0.5 10*3/uL (ref 0.1–1.0)
Monocytes Relative: 5.9 % (ref 3.0–12.0)
Neutro Abs: 4.6 10*3/uL (ref 1.4–7.7)
Neutrophils Relative %: 58.5 % (ref 43.0–77.0)
Platelets: 270 10*3/uL (ref 150.0–400.0)
RBC: 4.73 Mil/uL (ref 3.87–5.11)
RDW: 13 % (ref 11.5–15.5)
WBC: 7.9 10*3/uL (ref 4.0–10.5)

## 2018-11-01 LAB — IGA: IGA: 128 mg/dL (ref 68–378)

## 2018-11-01 LAB — SEDIMENTATION RATE: Sed Rate: 17 mm/hr (ref 0–30)

## 2018-11-01 MED ORDER — COLESTIPOL HCL 1 G PO TABS: 1.0000 g | ORAL_TABLET | Freq: Every day | ORAL | 3 refills | Status: DC

## 2018-11-01 MED ORDER — CILIDINIUM-CHLORDIAZEPOXIDE 2.5-5 MG PO CAPS: 1.0000 | ORAL_CAPSULE | Freq: Three times a day (TID) | ORAL | 2 refills | Status: DC

## 2018-11-01 MED ORDER — ELUXADOLINE 75 MG PO TABS: 75.0000 mg | ORAL_TABLET | Freq: Two times a day (BID) | ORAL | 2 refills | Status: DC

## 2018-11-01 NOTE — Patient Instructions (Addendum)
If you are age 54 or older, your body mass index should be between 23-30. Your Body mass index is 32.92 kg/m. If this is out of the aforementioned range listed, please consider follow up with your Primary Care Provider.  If you are age 42 or younger, your body mass index should be between 19-25. Your Body mass index is 32.92 kg/m. If this is out of the aformentioned range listed, please consider follow up with your Primary Care Provider.   Your provider has requested that you go to the basement level for lab work before leaving today. Press "B" on the elevator. The lab is located at the first door on the left as you exit the elevator.  We have sent the following medications to your pharmacy for you to pick up at your convenience: Viberzi Librax Colestid  You have been scheduled for a CT scan of the abdomen and pelvis at [facility] ([address]---this is in the same building as Press [facility]).   You are scheduled on 11/20/18 at 9 am. You should arrive 15 minutes prior to your appointment time for registration. Please follow the written instructions below on the day of your exam:  WARNING: IF YOU ARE ALLERGIC TO IODINE/X-RAY DYE, PLEASE NOTIFY RADIOLOGY IMMEDIATELY AT [id number]! YOU WILL BE GIVEN A 13 HOUR PREMEDICATION PREP.  1) Do not eat anything after 5 am (4 hours prior to your test) 2) You have been given 2 bottles of oral contrast to drink. The solution may taste better if refrigerated, but do NOT add ice or any other liquid to this solution. Shake well before drinking.    Drink 1 bottle of contrast @ 7 am (2 hours prior to your exam)  Drink 1 bottle of contrast @ 8 am (1 hour prior to your exam)  You may take any medications as prescribed with a small amount of water, if necessary. If you take any of the following medications: METFORMIN, GLUCOPHAGE, GLUCOVANCE, AVANDAMET, RIOMET, FORTAMET, [place] MET, JANUMET, GLUMETZA or METAGLIP, you MAY be asked to HOLD  this medication 48 hours AFTER the exam.  The purpose of you drinking the oral contrast is to aid in the visualization of your intestinal tract. The contrast solution may cause some diarrhea. Depending on your individual set of symptoms, you may also receive an intravenous injection of x-ray contrast/dye. Plan on being at [facility] for 30 minutes or longer, depending on the type of exam you are having performed.  This test typically takes 30-45 minutes to complete.  If you have any questions regarding your exam or if you need to reschedule, you may call the CT department at [phone] between the hours of 8:00 am and 5:00 pm, Monday-Friday.  ________________________________________________________________  We will call you with results.  Follow up with Dr. Silverio Decamp in 3-4 weeks.  The schedule is not available at this time.  Please call the office in a couple of weeks to schedule appointment.  Thank you for choosing me and [facility].   Amy Esterwood, PA-C

## 2018-11-01 NOTE — Progress Notes (Signed)
Subjective:    Patient ID: Kim Clements, female    DOB: [birth year], 53 y.o.   MRN: [id number]  HPI Kim Clements is a pleasant 53 year old white female, established with Dr. Silverio Clements, who has diagnosis of IBS D, GERD, diverticulosis, asthma and anxiety/depression. She comes in today with complaints of increase her GI symptoms over the past couple of months.  She was advised to try to minimize her lactose intake.  She has been using Lactaid as needed.  Over the past 6 weeks or so she has been having abdominal discomfort which she describes as a tight feeling or squeezing sensation in her abdomen most of the time.  About 2 weeks ago she had a bad episode of abdominal pain, cramping and diarrhea after she had consumed pizza and also had something earlier in the day with cheese.  The next 3 days she was very uncomfortable and crampy.  She says by last ferritin she was feeling better, then ate a few chocolate chip cookies and had recurrent generalized abdominal discomfort and loose stools.  She has not really felt well since over the past week.  She admits to being anxious about her symptoms and says she is almost gotten to the point where she is afraid to eat or stirring up the pain and cramping.  She has lost about 9 pounds since Christmas of 2019.  Over the past few days diarrhea has resolved but she continues to have some urgency. All of the symptoms have been despite using Colestid daily, Librax twice daily and Viberzi 75 mg daily. Patient says she had been on Zoloft for a long period of time and had stopped this in November.  She has recently spoken with her PCP regarding going back on Zoloft and was approved to start at 50 mg.  She has not started this , and wanted to see what we thought. She not been on any other new medications supplements, antibiotics.  She is worried about the weight loss and specifically mentions being concerned about cancer.  Last colonoscopy was done in June 2017 with  finding of sigmoid diverticulosis and internal hemorrhoids, no polyps.  Review of Systems Pertinent positive and negative review of systems were noted in the above HPI section.  All other review of systems was otherwise negative.  Outpatient Encounter Medications as of 11/01/2018  Medication Sig  . albuterol (PROVENTIL HFA;VENTOLIN HFA) 108 (90 Base) MCG/ACT inhaler Inhale into the lungs every 6 (six) hours as needed for wheezing or shortness of breath.  . AMBULATORY NON FORMULARY MEDICATION Allergy Shots 2 shots per week  . amitriptyline (ELAVIL) 10 MG tablet TAKE 1 TABLET BY MOUTH EVERYDAY AT BEDTIME  . bismuth subsalicylate (PEPTO BISMOL) 262 MG/15ML suspension Take 30 mLs by mouth as needed.  . calcium carbonate (TUMS - DOSED IN MG ELEMENTAL CALCIUM) 500 MG chewable tablet Chew 1 tablet by mouth daily.  . Cholecalciferol (VITAMIN D3) 25 MCG (1000 UT) CAPS Take 1 capsule by mouth daily.  . clidinium-chlordiazePOXIDE (LIBRAX) 5-2.5 MG capsule Take 1 capsule by mouth 3 (three) times daily before meals. Take 1/2 hour before meals.  . colestipol (COLESTID) 1 g tablet Take 1-2 tablets (1-2 g total) by mouth daily.  . Eluxadoline (VIBERZI) 75 MG TABS Take 75 mg by mouth 2 (two) times daily.  . fexofenadine (ALLEGRA) 180 MG tablet Take 180 mg by mouth daily.  . Lactase (LACTAID PO) Take 1 tablet by mouth as needed.  . Loperamide HCl (IMODIUM A-D PO) Take by  mouth as needed.  . Simethicone (GAS-X PO) Take by mouth as needed.  . [DISCONTINUED] clidinium-chlordiazePOXIDE (LIBRAX) 5-2.5 MG capsule PLEASE SEE ATTACHED FOR DETAILED DIRECTIONS  . [DISCONTINUED] colestipol (COLESTID) 1 g tablet TAKE 1-2 TABLETS BY MOUTH DAILY.  . [DISCONTINUED] Eluxadoline (VIBERZI) 75 MG TABS Take 75 mg by mouth 2 (two) times daily.   No facility-administered encounter medications on file as of 11/01/2018.    Allergies  Allergen Reactions  . Erythromycin   . Penicillins   . Sulfa Antibiotics    Patient Active  Problem List   Diagnosis Date Noted  . Adjustment disorder with mixed anxiety and depressed mood 06/23/2018  . Chronic pain of left ankle 07/03/2017  . GAD (generalized anxiety disorder) 12/10/2016  . Obesity (BMI 30.0-34.9) 12/10/2016  . Depression, recurrent ([place]) 12/10/2016  . IBS (irritable bowel syndrome), followed by [facility]   . HLD (hyperlipidemia)   . GERD (gastroesophageal reflux disease)   . Diverticulosis   . Colon polyps   . Asthma   . Arthritis    Social History   Socioeconomic History  . Marital status: Married    Spouse name: Not on file  . Number of children: 1  . Years of education: Not on file  . Highest education level: Not on file  Occupational History  . 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 Financial resource strain: Not on file  . Food insecurity:    Worry: Not on file    Inability: Not on file  . Transportation needs:    Medical: Not on file    Non-medical: Not on file  Tobacco Use  . Smoking status: Never Smoker  . Smokeless tobacco: Never Used  Substance and Sexual Activity  . Alcohol use: No    Alcohol/week: 0.0 standard drinks  . Drug use: No  . Sexual activity: Not on file  Lifestyle  . Physical activity:    Days per week: Not on file    Minutes per session: Not on file  . Stress: Not on file  Relationships  . Social connections:    Talks on phone: Not on file    Gets together: Not on file    Attends religious service: Not on file    Active member of club or organization: Not on file    Attends meetings of clubs or organizations: Not on file    Relationship status: Not on file  . Intimate partner violence:    Fear of current or ex partner: Not on file    Emotionally abused: Not on file    Physically abused: Not on file    Forced sexual activity: Not on file  Other Topics Concern  . Not on file  Social History Narrative  . Not on file    Ms. Bonk's family history includes Breast cancer in her paternal aunt and  paternal grandmother; Colon polyps in her father and mother; Heart disease in her maternal grandfather, maternal grandmother, mother, and paternal grandfather; Osteoarthritis in her mother; Prostate cancer in her paternal grandfather; Rheum arthritis in her mother; Stroke in her father.      Objective:    Vitals:   11/01/18 1510  BP: 126/80  Pulse: 97    Physical Exam; Well-developed older white female in no acute distress, pleasant, accompanied by her mother.  5 foot 1, weight 174, BMI 32.9.  HEENT; nontraumatic normocephalic EOMI PERRLA sclera anicteric.  Oral mucosa moist, Cardiovascular; regular rate and rhythm with S1-S2 no murmur rub  or gallop, Pulmonary; clear bilaterally, Abdomen; soft, no focal tenderness,, bowel sounds are present, no palpable mass or hepatosplenomegaly.  Rectal; exam not done.  Extremities; no clubbing cyanosis or edema skin warm dry.  Neuropsych; mood and affect appropriate grossly nonfocal alert and oriented.       Assessment & Plan:   #30 53 year old white female with history of IBS D has been on a regimen of the Viberzi, Librax and Colestid, who comes in with 6 to 8-week history of persistent abdominal discomfort, and intermittent episodes of nominal pain cramping and diarrhea.  This is been associated with a 9 pound weight loss over the past 2 months. Patient had also stopped Zoloft in November 2019-question if this may account for some of the weight loss since.  Etiology of current symptoms is not clear.  She may be having exacerbation of her IBS D despite fairly aggressive regimen, and now with recurrent anxiety contributing.  Rule out for abdominal malignancy, IBD or other intra-abdominal inflammatory process.  #2 question lactose intolerance #3 GERD #4 asthma #5 history of anxiety/depression #6 diverticulosis  Plan; Will increase Librax to 3 times daily 1/2-hour AC Continue Colestid 2 tablets daily 2 hours away from other meds Continue Viberzi 75  mg daily I advised her to restart Zoloft at 50 mg p.o. daily Check CBC with differential, c-Met, TSH, sed rate, TTG and IgA Schedule for CT scan of the abdomen pelvis with contrast Continue lactose avoidance, and Lactaid as needed. If CT and labs are negative she may benefit from breath testing both SIBO and lactose intolerance  Further recommendation pending results of above. Follow-up office visit with Dr. Silverio Clements  1 month.  Amy S Esterwood PA-C 11/01/2018   Cc: Briscoe Deutscher, DO

## 2018-11-02 LAB — TISSUE TRANSGLUTAMINASE ABS,IGG,IGA
(tTG) Ab, IgA: 1 U/mL
(tTG) Ab, IgG: 3 U/mL

## 2018-11-02 LAB — TSH: TSH: 2.01 u[IU]/mL (ref 0.35–4.50)

## 2018-11-03 ENCOUNTER — Ambulatory Visit: Payer: 59 | Admitting: Psychiatry

## 2018-11-03 DIAGNOSIS — F4323 Adjustment disorder with mixed anxiety and depressed mood: Secondary | ICD-10-CM | POA: Diagnosis not present

## 2018-11-03 NOTE — Progress Notes (Signed)
      [facility] Counselor/Therapist Progress Note  Patient ID: Kim Clements, MRN: [id number],    Date: 11/03/2018  Time Spent:  52  minutes  Treatment Type: Individual Therapy   Reported Symptoms: anxiety, stress, depression, fearful,  family concerns  Mental Status Exam:  Appearance:   Casual     Behavior:  Appropriate and Sharing  Motor:  Normal  Speech/Language:   Normal Rate  Affect:  anxious  Mood:  anxious  Thought process:  normal  Thought content:    WNL  Sensory/Perceptual disturbances:    WNL  Orientation:  oriented to person, place, time/date, situation, day of week, month of year and year  Attention:  Good  Concentration:  Good  Memory:  WNL  Fund of knowledge:   Good  Insight:    Good  Judgment:   Good  Impulse Control:  Good   Risk Assessment: Danger to Self:  No Self-injurious Behavior: No Danger to Others: No Duty to Warn:no Physical Aggression / Violence:No  Access to Firearms a concern: No  Gang Involvement:No   Subjective:  **Back on Zoloft 50mg . per her PCP.  On a 1-10 scale of anxiety, she puts herself at an 8 or 9..  Gets very anxious with so much flu going around.  Anxious about daughter's upcoming senior year.  Reviewed strategies including relaxation/mindfulness and CBT strategies.   Did look at ways of handling her feelings in more positive ways.  Stressed with fears of flu came up again in session and we focused on decreasing her "worry cycle"  which allows for a more positive approach to dealing with issues, however this is difficult for patient. Does see that her worrying always leads in negative directions.   Interventions: Cognitive Behavioral Therapy and Ego-Supportive  Diagnosis:   ICD-10-CM   1. Adjustment disorder with mixed anxiety and depressed mood F43.23     Plan:   Patient to practice strategies discussed in session to help manage her anxiety, depression, and concerns for her family, and in decreasing her  worry.  To be more proactive and consistent in her self-care, including eating healthier, getting more consistent sleep, and exercise.  To see again in 2 wks.  Mathis Fare, LCSW

## 2018-11-09 ENCOUNTER — Telehealth: Payer: Self-pay | Admitting: Physician Assistant

## 2018-11-09 NOTE — Telephone Encounter (Signed)
Pt last seen Amy 2.26.20 and had blood tests done.  Pt would like a CB to discuss results.  Pt is sched for CR with contrast 3.27.20 and stated that she is "terrified."  She inquired whether an MRI without contrast would be suitable.

## 2018-11-10 ENCOUNTER — Other Ambulatory Visit: Payer: Self-pay

## 2018-11-10 DIAGNOSIS — R7989 Other specified abnormal findings of blood chemistry: Secondary | ICD-10-CM

## 2018-11-10 NOTE — Telephone Encounter (Signed)
Recent labs not reviewed by the provider yet. She is scheduled for a CT with contrast abd/pelvis. No answer. Left a message to call us back on her voicemail.

## 2018-11-10 NOTE — Telephone Encounter (Signed)
Patient returning Beth's call. Patient requesting a call back on home number at #[phone].

## 2018-11-10 NOTE — Telephone Encounter (Signed)
Discussed the CT that is scheduled. She remembers an imaging test in which she drank barium. The barium caused terrible diarrhea. She is very unsure about having the CT. She has an appointment on 11/22/18. She can discuss this further with Dr Lavon Paganini.

## 2018-11-17 ENCOUNTER — Ambulatory Visit: Payer: 59 | Admitting: Psychiatry

## 2018-11-17 ENCOUNTER — Other Ambulatory Visit: Payer: Self-pay

## 2018-11-17 ENCOUNTER — Other Ambulatory Visit: Payer: Self-pay | Admitting: Family Medicine

## 2018-11-17 DIAGNOSIS — F4323 Adjustment disorder with mixed anxiety and depressed mood: Secondary | ICD-10-CM

## 2018-11-17 NOTE — Progress Notes (Signed)
      [facility] Counselor/Therapist Progress Note  Patient ID: Kim Clements, MRN: [id number],    Date: 11/17/2018  Time Spent:  60 minutes  Treatment Type: Individual Therapy   Reported Symptoms: anxiety, stress,  fearful,  family concerns  Mental Status Exam:  Appearance:   Casual     Behavior:  Appropriate and Sharing  Motor:  Normal  Speech/Language:   Normal Rate  Affect:  anxious  Mood:  anxious  Thought process:  normal  Thought content:    WNL  Sensory/Perceptual disturbances:    WNL  Orientation:  oriented to person, place, time/date, situation, day of week, month of year and year  Attention:  Good  Concentration:  Good  Memory:  WNL  Fund of knowledge:   Good  Insight:    Good  Judgment:   Good  Impulse Control:  Good   Risk Assessment: Danger to Self:  No Self-injurious Behavior: No Danger to Others: No Duty to Warn:no Physical Aggression / Violence:No  Access to Firearms a concern: No  Gang Involvement:No   Subjective:  **Back on Zoloft 50mg . per her PCP.  Patient in today with symptoms noted above.  On a 1-10 scale of anxiety, she puts herself at a "6" right now but is sometimes "a 4". Still gets anxious with so much flu and "virus scare" going around.  Anxious about daughter's upcoming senior year.  Reviewed strategies including relaxation/mindfulness and CBT strategies, as well as reminded to stick with her doctor-suggested lactose-free diet which has helped significantly with her IBS symptoms.   Anxiety fluctuates but is improved since last session.  Depression definitely decreased.  Is being careful in light of coronavirus issues and trying to be more "prepared but not panic" in her outlook.  That is a challenge for patient and she continues to work on it.      Interventions: Cognitive Behavioral Therapy and Ego-Supportive  Diagnosis:   ICD-10-CM   1. Adjustment disorder with mixed anxiety and depressed mood F43.23     Plan:   Patient to practice strategies discussed in session to help manage her anxiety, concerns for her family, and in decreasing her worry.  To be more proactive and consistent in her self-care, including eating healthier, getting more consistent sleep, and exercise.  To see again in 2 wks.  Mathis Fare, LCSW

## 2018-11-20 ENCOUNTER — Inpatient Hospital Stay: Admission: RE | Admit: 2018-11-20 | Payer: Self-pay | Source: Ambulatory Visit

## 2018-11-21 ENCOUNTER — Telehealth: Payer: Self-pay

## 2018-11-21 NOTE — Telephone Encounter (Signed)
Covid-19 travel screening questions  Have you traveled in the last 14 days? If yes where?  Do you now or have you had a fever in the last 14 days?  Do you have any respiratory symptoms of shortness of breath or cough now or in the last 14 days?  Do you have a medical history of Congestive Heart Failure? N/A  Do you have a medical history of lung disease? N/A  Do you have any family members or close contacts with diagnosed or suspected Covid-19?  Left message for pt to call back to reschedule her appt if she answers yes to any of these questions.

## 2018-11-22 ENCOUNTER — Telehealth: Payer: Self-pay

## 2018-11-22 ENCOUNTER — Ambulatory Visit: Payer: Self-pay | Admitting: Gastroenterology

## 2018-11-22 NOTE — Telephone Encounter (Signed)
CT is requesting to reschedule the patients upcoming CT on 12/01/18 until after 12/15/18.  Please review for urgency.   

## 2018-11-23 NOTE — Telephone Encounter (Signed)
CT needs to be done , this was ordered for symptoms

## 2018-11-28 NOTE — Progress Notes (Signed)
Reviewed and agree with documentation and assessment and plan. K. Veena Michaela Shankel , MD   

## 2018-11-29 NOTE — Telephone Encounter (Signed)
Patient refuses to have the CT done. She states she is feeling better. She does not want to come out of her home due to the COVID19 pandemic.

## 2018-11-30 NOTE — Telephone Encounter (Signed)
Patient is to call us if she has a return of these symptoms.

## 2018-11-30 NOTE — Telephone Encounter (Signed)
If pt feeling better and declining CT then cancel Ct . Please let pt know to schedule follow up office visit if any persisting  sxs  In next several weeks

## 2018-12-01 ENCOUNTER — Inpatient Hospital Stay: Admission: RE | Admit: 2018-12-01 | Payer: Self-pay | Source: Ambulatory Visit

## 2018-12-01 ENCOUNTER — Other Ambulatory Visit: Payer: Self-pay

## 2018-12-01 ENCOUNTER — Ambulatory Visit (INDEPENDENT_AMBULATORY_CARE_PROVIDER_SITE_OTHER): Payer: 59 | Admitting: Psychiatry

## 2018-12-01 DIAGNOSIS — F4323 Adjustment disorder with mixed anxiety and depressed mood: Secondary | ICD-10-CM

## 2018-12-01 NOTE — Progress Notes (Signed)
      [facility] Counselor/Therapist Progress Note  Patient ID: Kim Clements, MRN: [id number],    Date: 12/01/2018  Time Spent:  60 minutes 11:58am to 12:58pm  Treatment Type: Individual Therapy   Virtual Visit via Telephone Note I connected with patient by a video enabled telemedicine application or telephone, with their informed consent, and verified patient privacy and that I am speaking with the correct person using two identifiers. I am at [facility] and patient is at home.   I discussed the limitations, risks, security and privacy concerns of performing psychotherapy and management service by telephone and the availability of in person appointments. I also discussed with the patient that there may be a patient responsible charge related to this service. The patient expressed understanding and agreed to proceed.  I discussed the treatment planning with the patient. The patient was provided an opportunity to ask questions and all were answered. The patient agreed with the plan and demonstrated an understanding of the instructions.   The patient was advised to call  our office if  symptoms worsen or feel they are in a crisis state and need immediate contact.   Reported Symptoms: anxiety, stress,  fearful,  family concerns  Mental Status Exam:  Appearance:   n/a    Behavior:  sharing  Motor:  Normal  Speech/Language:   Normal Rate  Affect:  n/a  Mood:  anxious  Thought process:  Goal directed  Thought content:    WNL  Sensory/Perceptual disturbances:    WNL  Orientation:  oriented to person, place, time/date, situation, day of week, month of year and year  Attention:  Good  Concentration:  Good  Memory:  WNL  Fund of knowledge:   Good  Insight:    Good  Judgment:   Good  Impulse Control:  Good   Risk Assessment: Danger to Self:  No Self-injurious Behavior: No Danger to Others: No Duty to Warn:no Physical Aggression / Violence:No  Access to  Firearms a concern: No  Gang Involvement:No   Subjective:    **Has increased Zoloft from 50mg  to 100mg .   Patient in today with symptoms noted above.  On a 1-10 scale of anxiety, she puts herself at a " 6" right now.  Experiencing more anxiety  with so much flu and virus scare going around.  Processed her anxiety and concerns at length.  Also had her focus on some positives in her life which she successfully did.   Reviewed strategies including relaxation/mindfulness and CBT strategies, as well as reminded to stick with her doctor-suggested lactose-free diet which had helped with her IBS symptoms.    Is being careful in light of coronavirus issues and trying to be more "prepared but not panic", which is very difficult for patient and she continues to work on decreasing her anxiety level.     Interventions: Cognitive Behavioral Therapy and Ego-Supportive  Diagnosis:   ICD-10-CM   1. Adjustment disorder with mixed anxiety and depressed mood F43.23     Plan:   Patient to practice strategies discussed in session to help manage her anxiety, concerns for her family, and in decreasing her worry.  To be more proactive and consistent in her self-care, including eating healthier, getting more consistent sleep, and exercise especially walking.  To see again in 2 wks.  Mathis Fare, LCSW

## 2018-12-12 ENCOUNTER — Other Ambulatory Visit: Payer: Self-pay

## 2018-12-12 ENCOUNTER — Ambulatory Visit (INDEPENDENT_AMBULATORY_CARE_PROVIDER_SITE_OTHER): Payer: 59 | Admitting: Psychiatry

## 2018-12-12 DIAGNOSIS — F4323 Adjustment disorder with mixed anxiety and depressed mood: Secondary | ICD-10-CM

## 2018-12-12 NOTE — Progress Notes (Signed)
[facility] Counselor/Therapist Progress Note  Patient ID: Kim Clements, MRN: [id number],    Date: 12/12/2018  Time Spent:  60 minutes 3:58pm to 4:58pm  Treatment Type: Individual Therapy   Virtual Visit via Video Note I connected with patient by a video enabled telemedicine application or telephone, with their informed consent, and verified patient privacy and that I am speaking with the correct person using two identifiers. I am at [facility] and patient is at home.   I discussed the limitations, risks, security and privacy concerns of performing psychotherapy and management service by telephone and the availability of in person appointments. I also discussed with the patient that there may be a patient responsible charge related to this service. The patient expressed understanding and agreed to proceed.  I discussed the treatment planning with the patient. The patient was provided an opportunity to ask questions and all were answered. The patient agreed with the plan and demonstrated an understanding of the instructions.   The patient was advised to call  our office if  symptoms worsen or feel they are in a crisis state and need immediate contact.   Reported Symptoms: anxiety, stress,  fearful,  family concerns  Mental Status Exam:  Appearance:   casual   Behavior:  sharing  Motor:  Normal  Speech/Language:   Normal Rate  Affect:  anxieous  Mood:  anxious  Thought process:  Goal directed  Thought content:    WNL  Sensory/Perceptual disturbances:    WNL  Orientation:  oriented to person, place, time/date, situation, day of week, month of year and year  Attention:  Good  Concentration:  Good  Memory:  WNL  Fund of knowledge:   Good  Insight:    Good  Judgment:   Good  Impulse Control:  Good   Risk Assessment: Danger to Self:  No Self-injurious Behavior: No Danger to Others: No Duty to Warn:no Physical Aggression / Violence:No  Access to  Firearms a concern: No  Gang Involvement:No   Subjective:    Has remained Zoloft from 50mg  to 100mg .   Patient today reports with symptoms noted above.  On a 1-10 scale of anxiety, she puts herself at a "5" right now.  Still experiencing more anxiety about family concerns and with so much flu and virus scare going around, however anxiety has decreased some over past week.  Processed her anxiety and concerns more at length, with the goal of lessening the anxiety even more.  Able to focus on some positives in her life which is a good step for her.  Encouraged her to mentally and emotionally hold onto those positives and helped her understand some techniques for doing that successfully.   Reviewed strategies including relaxation/mindfulness, focus on positives, and CBT strategies, as well as reminded to stick with her doctor-suggested lactose-free diet which had helped with her IBS symptoms.    Is being careful in light of coronavirus issues and trying to be more "prepared but not panic", which is very difficult for patient and she continues to work on decreasing her anxiety level and this past week has had some success in this. Recently it seems her overall anxiety has lessened a bit and has experienced spikes of anxiety at times especially around the coronavirus concerns.   Interventions: Cognitive Behavioral Therapy and Ego-Supportive  Diagnosis:   ICD-10-CM   1. Adjustment disorder with mixed anxiety and depressed mood F43.23     Plan:   Patient to practice  strategies discussed in session to help manage her anxiety, concerns for her family, and in continuing to decrease her worry. To be more proactive and consistent in her self-care, including eating healthier, getting more consistent sleep, and exercise especially walking, and to work on improving her sleep and getting up schedule.  Next appt within in 2 wks.  Mathis Fareeborah Lorretta Kerce, LCSW

## 2018-12-27 DIAGNOSIS — J301 Allergic rhinitis due to pollen: Secondary | ICD-10-CM | POA: Insufficient documentation

## 2018-12-27 DIAGNOSIS — Z91010 Allergy to peanuts: Secondary | ICD-10-CM | POA: Insufficient documentation

## 2018-12-27 DIAGNOSIS — Z91013 Allergy to seafood: Secondary | ICD-10-CM | POA: Insufficient documentation

## 2018-12-27 DIAGNOSIS — J3081 Allergic rhinitis due to animal (cat) (dog) hair and dander: Secondary | ICD-10-CM | POA: Insufficient documentation

## 2018-12-28 ENCOUNTER — Other Ambulatory Visit: Payer: Self-pay

## 2018-12-28 ENCOUNTER — Ambulatory Visit (INDEPENDENT_AMBULATORY_CARE_PROVIDER_SITE_OTHER): Payer: 59 | Admitting: Psychiatry

## 2018-12-28 ENCOUNTER — Ambulatory Visit: Payer: 59 | Admitting: Psychiatry

## 2018-12-28 DIAGNOSIS — F4323 Adjustment disorder with mixed anxiety and depressed mood: Secondary | ICD-10-CM | POA: Diagnosis not present

## 2018-12-28 NOTE — Progress Notes (Signed)
[facility] Counselor/Therapist Progress Note  Patient ID: Kim Clements, MRN: [id number],    Date: 12/28/2018  Time Spent:  60 minutes 12:00 noon to 1:00 pm  Treatment Type: Individual Therapy   Virtual Visit Note I connected with patient by a video enabled telemedicine application or telephone, with their informed consent, and verified patient privacy and that I am speaking with the correct person using two identifiers. I am at [facility] and patient is at home.   I discussed the limitations, risks, security and privacy concerns of performing psychotherapy and management service by telephone and the availability of in person appointments. I also discussed with the patient that there may be a patient responsible charge related to this service. The patient expressed understanding and agreed to proceed.  I discussed the treatment planning with the patient. The patient was provided an opportunity to ask questions and all were answered. The patient agreed with the plan and demonstrated an understanding of the instructions.   The patient was advised to call  our office if  symptoms worsen or feel they are in a crisis state and need immediate contact.   Reported Symptoms: anxiety, stress, family concerns  Mental Status Exam:  Appearance:   N/A  (telehealth)  Behavior:  sharing  Motor:  Normal  Speech/Language:   Normal Rate  Affect:  N/A  (telehealth)  Mood:  anxious  Thought process:  Goal directed  Thought content:    WNL  Sensory/Perceptual disturbances:    WNL  Orientation:  oriented to person, place, time/date, situation, day of week, month of year and year  Attention:  Good  Concentration:  Good  Memory:  WNL  Fund of knowledge:   Good  Insight:    Good  Judgment:   Good  Impulse Control:  Good   Risk Assessment: Danger to Self:  No Self-injurious Behavior: No Danger to Others: No Duty to Warn:no Physical Aggression / Violence:No  Access  to Firearms a concern: No  Gang Involvement:No   Subjective:    Has remained Zoloft from 50mg  to 100mg .   Patient today reports with symptoms noted above.  On a 1-10 scale of anxiety, she puts herself at a "7" right now.  Still experiencing significant anxiety about coronavirus and family concerns. Patient find it so difficult to separate from TV news and news on Facebook, all of which are anxiety-producing.   Processed at length her anxiety and concerns more at length, with the goal of lessening the anxiety and more importantly trying to work with her on lessening her exposure to social media and TV where she is involved in watching for extended periods of time to news about the virus, understanding that this feeds the anxiety.  Was later able to focus on some positives in her life which is a good step for her.  Encouraged her again, to mentally and emotionally hold onto those positives and helped her understand some techniques for doing that more successfully.  Reviewed strategies including relaxation/mindfulness, focus on positives, and CBT strategies, as well as reminded to stick with her doctor-suggested lactose-free diet which had helped with her IBS symptoms  have improved.   Interventions: Cognitive Behavioral Therapy and Ego-Supportive  Diagnosis:   ICD-10-CM   1. Adjustment disorder with mixed anxiety and depressed mood F43.23     Plan:   Patient to practice strategies discussed in session to help manage her anxiety, concerns for her family, and in continuing to decrease her worry.  To be more proactive and consistent in her self-care, including eating healthier, getting more consistent sleep, and exercise especially walking, and to work on improving her sleep and getting up schedule.  Next appt within in 2 wks.  Mathis Fareeborah Rhian Asebedo, LCSW

## 2019-01-03 ENCOUNTER — Encounter: Payer: Self-pay | Admitting: Family Medicine

## 2019-01-03 ENCOUNTER — Other Ambulatory Visit: Payer: Self-pay

## 2019-01-03 ENCOUNTER — Ambulatory Visit (INDEPENDENT_AMBULATORY_CARE_PROVIDER_SITE_OTHER): Payer: 59 | Admitting: Family Medicine

## 2019-01-03 VITALS — Ht 61.0 in | Wt 162.0 lb

## 2019-01-03 DIAGNOSIS — Z7189 Other specified counseling: Secondary | ICD-10-CM

## 2019-01-03 DIAGNOSIS — F4323 Adjustment disorder with mixed anxiety and depressed mood: Secondary | ICD-10-CM | POA: Diagnosis not present

## 2019-01-03 DIAGNOSIS — M25572 Pain in left ankle and joints of left foot: Secondary | ICD-10-CM

## 2019-01-03 DIAGNOSIS — E669 Obesity, unspecified: Secondary | ICD-10-CM

## 2019-01-03 DIAGNOSIS — G8929 Other chronic pain: Secondary | ICD-10-CM

## 2019-01-03 MED ORDER — AMITRIPTYLINE HCL 10 MG PO TABS: ORAL_TABLET | ORAL | 0 refills | Status: DC

## 2019-01-03 MED ORDER — SERTRALINE HCL 100 MG PO TABS: 100.0000 mg | ORAL_TABLET | Freq: Every day | ORAL | 1 refills | Status: DC

## 2019-01-03 NOTE — Patient Instructions (Signed)

## 2019-01-03 NOTE — Progress Notes (Signed)
Virtual Visit via Video   Due to the COVID-19 pandemic, this visit was completed with telemedicine (audio/video) technology to reduce patient and provider exposure as well as to preserve personal protective equipment.   I connected with Kim Clements on 01/03/19 at  3:00 PM EDT by a video enabled telemedicine application and verified that I am speaking with the correct person using two identifiers. Location patient: Home Location provider: [facility], Office Persons participating in the virtual visit: Pincus LargeCatherine Fulton Clements, Aranda Bihm, DO Barnie MortJoEllen Thompson, CMA acting as scribe for Dr. Helane RimaErica Cailah Reach.   I discussed the limitations of evaluation and management by telemedicine and the availability of in person appointments. The patient expressed understanding and agreed to proceed.  Care Team   Patient Care Team: Helane RimaWallace, Nael Petrosyan, DO as PCP - General (Family Medicine)  Subjective:   HPI: She has recently discovered that she is lactose intolerant. She has lost 12lbs after changing her diet. She has had some increased depression due to not being able to see family. She has restarted her zoloft and feels like it was a big help.   Review of Systems  Constitutional: Negative for chills and fever.  HENT: Negative for hearing loss.   Eyes: Negative for blurred vision.  Respiratory: Negative for cough.   Cardiovascular: Negative for chest pain.  Gastrointestinal: Negative for heartburn.  Genitourinary: Negative for dysuria and urgency.  Musculoskeletal: Negative for myalgias.  Skin: Negative for rash.  Neurological: Negative for dizziness and headaches.  Endo/Heme/Allergies: Does not bruise/bleed easily.  Psychiatric/Behavioral: Negative for depression.    Patient Active Problem List   Diagnosis Date Noted  . Adjustment disorder with mixed anxiety and depressed mood 06/23/2018  . Chronic pain of left ankle 07/03/2017  . GAD (generalized anxiety disorder) 12/10/2016  .  Obesity (BMI 30.0-34.9) 12/10/2016  . Depression, recurrent (HCC) 12/10/2016  . IBS (irritable bowel syndrome), followed by [facility]   . HLD (hyperlipidemia)   . GERD (gastroesophageal reflux disease)   . Diverticulosis   . Colon polyps   . Asthma   . Arthritis     Social History   Tobacco Use  . Smoking status: Never Smoker  . Smokeless tobacco: Never Used  Substance Use Topics  . Alcohol use: No    Alcohol/week: 0.0 standard drinks   Current Outpatient Medications:  .  albuterol (PROVENTIL HFA;VENTOLIN HFA) 108 (90 Base) MCG/ACT inhaler, Inhale into the lungs every 6 (six) hours as needed for wheezing or shortness of breath., Disp: , Rfl:  .  AMBULATORY NON FORMULARY MEDICATION, Allergy Shots 2 shots per week, Disp: , Rfl:  .  amitriptyline (ELAVIL) 10 MG tablet, TAKE 1 TABLET BY MOUTH EVERYDAY AT BEDTIME, Disp: 90 tablet, Rfl: 0 .  bismuth subsalicylate (PEPTO BISMOL) 262 MG/15ML suspension, Take 30 mLs by mouth as needed., Disp: , Rfl:  .  calcium carbonate (TUMS - DOSED IN MG ELEMENTAL CALCIUM) 500 MG chewable tablet, Chew 1 tablet by mouth daily., Disp: , Rfl:  .  Cholecalciferol (VITAMIN D3) 25 MCG (1000 UT) CAPS, Take 1 capsule by mouth daily., Disp: , Rfl:  .  clidinium-chlordiazePOXIDE (LIBRAX) 5-2.5 MG capsule, Take 1 capsule by mouth 3 (three) times daily before meals. Take 1/2 hour before meals., Disp: 270 capsule, Rfl: 2 .  colestipol (COLESTID) 1 g tablet, Take 1-2 tablets (1-2 g total) by mouth daily., Disp: 180 tablet, Rfl: 3 .  Eluxadoline (VIBERZI) 75 MG TABS, Take 75 mg by mouth 2 (two) times daily., Disp:  180 tablet, Rfl: 2 .  fexofenadine (ALLEGRA) 180 MG tablet, Take 180 mg by mouth daily., Disp: , Rfl:  .  Lactase (LACTAID PO), Take 1 tablet by mouth as needed., Disp: , Rfl:  .  Loperamide HCl (IMODIUM A-D PO), Take by mouth as needed., Disp: , Rfl:  .  sertraline (ZOLOFT) 100 MG tablet, TAKE 1 TABLET BY MOUTH EVERY DAY, Disp: 90 tablet, Rfl: 1 .   Simethicone (GAS-X PO), Take by mouth as needed., Disp: , Rfl:   Allergies  Allergen Reactions  . Erythromycin   . Penicillins   . Sulfa Antibiotics    Objective:   VITALS: Per patient if applicable, see vitals. GENERAL: Alert, appears well and in no acute distress. HEENT: Atraumatic, conjunctiva clear, no obvious abnormalities on inspection of external nose and ears. NECK: Normal movements of the head and neck. CARDIOPULMONARY: No increased WOB. Speaking in clear sentences. I:E ratio WNL.  MS: Moves all visible extremities without noticeable abnormality. PSYCH: Pleasant and cooperative, well-groomed. Speech normal rate and rhythm. Affect is appropriate. Insight and judgement are appropriate. Attention is focused, linear, and appropriate.  NEURO: CN grossly intact. Oriented as arrived to appointment on time with no prompting. Moves both UE equally.  SKIN: No obvious lesions, wounds, erythema, or cyanosis noted on face or hands.  Depression screen [facility] 2/9 02/14/2018 12/09/2016  Decreased Interest 0 0  Down, Depressed, Hopeless 1 0  PHQ - 2 Score 1 0  Altered sleeping 0 -  Tired, decreased energy 1 -  Change in appetite 0 -  Feeling bad or failure about yourself  0 -  Trouble concentrating 0 -  Moving slowly or fidgety/restless 0 -  PHQ-9 Score 2 -  Difficult doing work/chores Not difficult at all -   Assessment and Plan:   [name] was seen today for follow-up.  Diagnoses and all orders for this visit:  Adjustment disorder with mixed anxiety and depressed mood Comments: Improved with restarting Zoloft. Continuing with therapy.  Orders: -     sertraline (ZOLOFT) 100 MG tablet; Take 1 tablet (100 mg total) by mouth daily.  Educated About Covid-19 Virus Infection  Obesity (BMI 30.0-34.9)  Chronic pain of left ankle -     amitriptyline (ELAVIL) 10 MG tablet; One tab nightly    . COVID-19 Education: The signs and symptoms of COVID-19 were discussed with the patient and  how to seek care for testing if needed. The importance of social distancing was discussed today. . Reviewed expectations re: course of current medical issues. . Discussed self-management of symptoms. . Outlined signs and symptoms indicating need for more acute intervention. . Patient verbalized understanding and all questions were answered. Marland Kitchen Health Maintenance issues including appropriate healthy diet, exercise, and smoking avoidance were discussed with patient. . See orders for this visit as documented in the electronic medical record.  Helane Rima, DO 01/03/2019  Records requested if needed. Time spent: 25 minutes, of which >50% was spent in obtaining information about her symptoms, reviewing her previous labs, evaluations, and treatments, counseling her about her condition (please see the discussed topics above), and developing a plan to further investigate it; she had a number of questions which I addressed.

## 2019-01-11 ENCOUNTER — Other Ambulatory Visit: Payer: Self-pay

## 2019-01-11 ENCOUNTER — Ambulatory Visit (INDEPENDENT_AMBULATORY_CARE_PROVIDER_SITE_OTHER): Payer: 59 | Admitting: Psychiatry

## 2019-01-11 DIAGNOSIS — F4323 Adjustment disorder with mixed anxiety and depressed mood: Secondary | ICD-10-CM

## 2019-01-11 NOTE — Progress Notes (Addendum)
[facility] Counselor/Therapist Progress Note  Patient ID: Kim LivingsCatherine Fulton Hazen, MRN: [id number],    Date: 01/11/2019   Time Spent:  60 minutes    1:00pm to 2:00 pm  Treatment Type: Individual Therapy   Virtual Visit Note I connected with patient by a video enabled telemedicine application or telephone, with their informed consent, and verified patient privacy and that I am speaking with the correct person using two identifiers. [facility] patient [facility].   I discussed the limitations, risks, security and privacy concerns of performing psychotherapy and management service by telephone and the availability of in person appointments. I also discussed with the patient that there may be a patient responsible charge related to this service. The patient expressed understanding and agreed to proceed.  I discussed the treatment planning with the patient. The patient was provided an opportunity to ask questions and all were answered. The patient agreed with the plan and demonstrated an understanding of the instructions.   The patient was advised to call  our office if  symptoms worsen or feel they are in a crisis state and need immediate contact.   Reported Symptoms: anxiety, stress, family concerns, reports "some days more stressful than others and is concerned that the coronavirus situation.  Mental Status Exam:  Appearance:   N/A  (telehealth)  Behavior:  sharing  Motor:  Normal  Speech/Language:   Normal Rate  Affect:  N/A  (telehealth)  Mood:  anxious  Thought process:  Goal directed  Thought content:    WNL  Sensory/Perceptual disturbances:    WNL  Orientation:  oriented to person, place, time/date, situation, day of week, month of year and year  Attention:  Good  Concentration:  Good  Memory:  WNL  Fund of knowledge:   Good  Insight:    Good  Judgment:   Good  Impulse Control:  Good   Risk Assessment: Danger to Self:  No Self-injurious  Behavior: No Danger to Others: No Duty to Warn:no Physical Aggression / Violence:No  Access to Firearms a concern: No  Gang Involvement:No   Subjective:    Has remained Zoloft from 50mg  to 100mg .   Patient today reports continued symptoms noted above and feels that the "coronavirus situation is affecting her emotions and feelings".  On a 1-10 scale of anxiety, she puts herself at a "4" right now as "today is a better day and it's good that we're talking." Still experiencing significant anxiety with family concerns. Patient finding it so difficult to separate from TV news and news on Facebook, all of which are anxiety-producing. Worked with her on this more today and she agreed to take more breaks from distressing newscasts.  Processed at length her anxiety and concerns more at length, with the goal of lessening the anxiety and more importantly trying to work with her to realize the negative effects it all has on her.  Is getting better at intentionally focusing on some positives in her life which is empowering for patient.  Encouraged her again, to hold onto those positives and helped her understand some techniques for doing that more successfully.  Reviewed strategies including relaxation/mindfulness, focus on positives, and CBT strategies, as well as reminded to stick with her doctor-suggested lactose-free diet which had helped with her IBS symptoms  have improved.   Interventions: Cognitive Behavioral Therapy and Ego-Supportive  Diagnosis:   ICD-10-CM   1. Adjustment disorder with mixed anxiety and depressed mood F43.23  Plan:   Patient to practice strategies discussed in session to help manage her anxiety, concerns for her family, and in continuing to decrease her worry. To be more proactive and consistent in her self-care, including eating healthier, getting more consistent sleep, and exercise especially walking, and to work on improving her sleep and getting up schedule, and limiting  her watching of news online and TV.  Next appt within in 2 wks.  Mathis Fare, LCSW

## 2019-01-18 ENCOUNTER — Encounter: Payer: Self-pay | Admitting: Physical Therapy

## 2019-01-23 ENCOUNTER — Ambulatory Visit (INDEPENDENT_AMBULATORY_CARE_PROVIDER_SITE_OTHER): Payer: 59 | Admitting: Psychiatry

## 2019-01-23 ENCOUNTER — Other Ambulatory Visit: Payer: Self-pay

## 2019-01-23 DIAGNOSIS — F4323 Adjustment disorder with mixed anxiety and depressed mood: Secondary | ICD-10-CM | POA: Diagnosis not present

## 2019-01-23 NOTE — Progress Notes (Signed)
[facility] Counselor/Therapist Progress Note  Patient ID: Kim LivingsCatherine Fulton Clements, MRN: [id number],    Date: 01/11/2019   Time Spent:  60 minutes    12:00noon to 1:00pm  Treatment Type: Individual Therapy   Virtual Visit Note I connected with patient by a video enabled telemedicine application or telephone, with their informed consent, and verified patient privacy and that I am speaking with the correct person using two identifiers. [facility] and patient is at home.   I discussed the limitations, risks, security and privacy concerns of performing psychotherapy and management service by telephone and the availability of in person appointments. I also discussed with the patient that there may be a patient responsible charge related to this service. The patient expressed understanding and agreed to proceed.  I discussed the treatment planning with the patient. The patient was provided an opportunity to ask questions and all were answered. The patient agreed with the plan and demonstrated an understanding of the instructions.   The patient was advised to call  our office if  symptoms worsen or feel they are in a crisis state and need immediate contact.   Reported Symptoms: anxiety, stress, family concerns, reports "some days more stressful than others and is concerned that the coronavirus situation.  Mental Status Exam:  Appearance:   N/A  (telehealth)  Behavior:  sharing  Motor:  Normal  Speech/Language:   Normal Rate  Affect:  N/A  (telehealth)  Mood:  anxious  Thought process:  Goal directed  Thought content:    WNL  Sensory/Perceptual disturbances:    WNL  Orientation:  oriented to person, place, time/date, situation, day of week, month of year and year  Attention:  Good  Concentration:  Good  Memory:  WNL  Fund of knowledge:   Good  Insight:    Good  Judgment:   Good  Impulse Control:  Good   Risk Assessment: Danger to Self:  No Self-injurious  Behavior: No Danger to Others: No Duty to Warn:no Physical Aggression / Violence:No  Access to Firearms a concern: No  Gang Involvement:No   Subjective:  (Has remained on Zoloft 100mg .)  Patient reports continued symptoms listed above and was doing better for about a week with her anxiety and stress.  Today, it's obvious that the anxiety and stress are still an issue and a lot seems focused on coronavirus concerns.  Tending to focus excessively on anxiety-provoking thoughts and verbalizing a lot of what-ifs, typically leading to more fear, anxiety, and apprehension.  Talked at length about what can help her with her anxiety and tendency to be pulled into things that set off her anxious feelings.  The anxious feelings lead her to "worry" a lot about various things.  Often doesn't recognize the level of her anxiety which we discussed today.   On a 1-10 scale of anxiety, after thinking, she puts herself at a "7" today.  Still experiencing significant anxiety with family concerns which we discussed some more today.  Patient finding it difficult but is doing some better  separating  from TV news and news on Facebook, all of which can be very  anxiety-producing for patient.  Worked with her on this again today and she agreed to take more breaks from distressing newscasts and troubling facebook posts.  Continuing with the goal of lessening the anxiety and more importantly trying to work with her to realize the negative effects it all has on her physically and emotionally, and  impacts her family.  Encouraged her again, to hold onto those positives and helped her understand some techniques for doing that more successfully. 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   Interventions: Cognitive Behavioral Therapy and Ego-Supportive  Diagnosis:   ICD-10-CM    1. Adjustment disorder with mixed anxiety and depressed mood F43.23     Plan:   Patient to practice strategies discussed in session to help manage her anxiety, concerns for her family, and in continuing to decrease her worry. To be more proactive and consistent in her self-care, including eating healthier, getting more consistent sleep, and exercise especially walking, and to work on improving her sleep and getting up schedule, and limiting her watching of news online and TV, and relying also on her faith and a more active use of prayer (which has helped her in the past). Next appt within in 2 wks.  Mathis Fare, LCSW

## 2019-02-13 ENCOUNTER — Other Ambulatory Visit: Payer: Self-pay

## 2019-02-13 ENCOUNTER — Ambulatory Visit (INDEPENDENT_AMBULATORY_CARE_PROVIDER_SITE_OTHER): Payer: 59 | Admitting: Psychiatry

## 2019-02-13 DIAGNOSIS — F4323 Adjustment disorder with mixed anxiety and depressed mood: Secondary | ICD-10-CM

## 2019-02-13 NOTE — Progress Notes (Signed)
[facility] Counselor/Therapist Progress Note  Patient ID: Kim Clements, MRN: [id number],    Date: 02/13/2019   Time Spent:  60 minutes    12:00noon to 1:00pm  Treatment Type: Individual Therapy   Virtual Visit Note I connected with patient by a video enabled telemedicine application or telephone, with their informed consent, and verified patient privacy and that I am speaking with the correct person using two identifiers. I am at [place] and patient is at home.   I discussed the limitations, risks, security and privacy concerns of performing psychotherapy and management service by telephone and the availability of in person appointments. I also discussed with the patient that there may be a patient responsible charge related to this service. The patient expressed understanding and agreed to proceed.  I discussed the treatment planning with the patient. The patient was provided an opportunity to ask questions and all were answered. The patient agreed with the plan and demonstrated an understanding of the instructions.   The patient was advised to call  our office if  symptoms worsen or feel they are in a crisis state and need immediate contact.   Reported Symptoms: anxiety, stress, family concerns, reports "some days more stressful than others and is concerned that the coronavirus situation and racial unrest"   Mental Status Exam:  Appearance:   N/A  (telehealth)  Behavior:  sharing  Motor:  Normal  Speech/Language:   Normal Rate  Affect:  N/A  (telehealth)  Mood:  anxious, stressed  Thought process:  Goal directed  Thought content:    WNL  Sensory/Perceptual disturbances:    WNL  Orientation:  oriented to person, place, time/date, situation, day of week, month of year and year  Attention:  Good  Concentration:  Good  Memory:  WNL  Fund of knowledge:   Good  Insight:    Good  Judgment:   Good  Impulse Control:  Good   Risk Assessment: Danger  to Self:  No Self-injurious Behavior: No Danger to Others: No Duty to Warn:no Physical Aggression / Violence:No  Access to Firearms a concern: No  Gang Involvement:No   Subjective:  (Still remains on Zoloft 100mg .)   Patient today reports symptoms above,  with the anxiety and stress escalated some due to the ongoing Coronavirus and the recent racial tensions.  Trying to improve her limiting her exposure to social media(facebook posts) and TV news. Looked at how the over-exposure fuels her anxiety and stress.  Discussed specific ways to be more consistent in her limiting the news and keeping it limited.  Some excessive worrying about current concerns with virus and racial tensions as well as worrying about "what could happen" in the future.  Enouraging her to stay more in the Present versus in the past or too far into the future. Also helping her acknowledge her "triggers" and trying to minimize them or proactively prevent if possible.    To work harder to hold onto the positives and helped her review again some techniques for doing that more successfully. 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   Interventions: Cognitive Behavioral Therapy and Ego-Supportive  Diagnosis:   ICD-10-CM   1. Adjustment disorder with mixed anxiety and depressed mood F43.23     Plan:   Patient to practice strategies discussed in session to help manage her anxiety, concerns for her family,  to decrease her worry. To be more proactive and consistent in her self-care, including eating healthier, getting more consistent sleep, and exercise especially walking, and to work on improving her sleep and getting up schedule, and limiting her watching of news online and TV, and relying also on her faith and a more active use of prayer (which has helped her in the past).  Next appt within in 2 wks.  Mathis Fareeborah Lielle Vandervort, LCSW

## 2019-02-27 ENCOUNTER — Ambulatory Visit (INDEPENDENT_AMBULATORY_CARE_PROVIDER_SITE_OTHER): Payer: 59 | Admitting: Psychiatry

## 2019-02-27 ENCOUNTER — Other Ambulatory Visit: Payer: Self-pay

## 2019-02-27 DIAGNOSIS — F4323 Adjustment disorder with mixed anxiety and depressed mood: Secondary | ICD-10-CM

## 2019-02-27 NOTE — Progress Notes (Signed)
[facility] Counselor/Therapist Progress Note  Patient ID: Kim Clements, MRN: [id number],    Date: 02/27/2019   Time Spent:  60 minutes    12:00noon to 1:00pm  Treatment Type: Individual Therapy   Virtual Visit Note I connected with patient by a video enabled telemedicine application or telephone, with their informed consent, and verified patient privacy and that I am speaking with the correct person using two identifiers. I am at [place] and patient is at home.   I discussed the limitations, risks, security and privacy concerns of performing psychotherapy and management service by telephone and the availability of in person appointments. I also discussed with the patient that there may be a patient responsible charge related to this service. The patient expressed understanding and agreed to proceed.  I discussed the treatment planning with the patient. The patient was provided an opportunity to ask questions and all were answered. The patient agreed with the plan and demonstrated an understanding of the instructions.   The patient was advised to call  our office if  symptoms worsen or feel they are in a crisis state and need immediate contact.   Reported Symptoms: anxiety, stress, family concerns, concerned about the coronavirus situation and racial unrest  Mental Status Exam:  Appearance:   N/A  (telehealth)  Behavior:  sharing  Motor:  Normal  Speech/Language:   Normal Rate  Affect:  N/A  (telehealth)  Mood:  anxious, stressed  Thought process:  Goal directed  Thought content:    WNL  Sensory/Perceptual disturbances:    WNL  Orientation:  oriented to person, place, time/date, situation, day of week, month of year and year  Attention:  Good  Concentration:  Good  Memory:  WNL  Fund of knowledge:   Good  Insight:    Good  Judgment:   Good  Impulse Control:  Good   Risk Assessment: Danger to Self:  No Self-injurious Behavior: No Danger to  Others: No Duty to Warn:no Physical Aggression / Violence:No  Access to Firearms a concern: No  Gang Involvement:No   Subjective:  (Still remains on Zoloft 100mg .)   Patient reporting the symptoms above and  the anxiety and stress have escalated some due to the COVID-19 and racial tensions throughout the country.  Processed concerns with family issues as well as her aging mother.  Anxiety heightened more with recent spike in COVID-19 cases.  Patient really needing to limit the time she spends on the News and in discussing News with others, as this seems to feed her anxiety.  Noticeably more anxious as we spoke and admits talking so much about all the virus and protesting going on is elevating her anxiety.  She agrees to make a more conscientious effort to cut back as it tend to be habitual for her. Talked about this more and she did seem calmer before session end. Focus also on police and racial tensions and overly focused on "what could happen".  Enouraging her to stay more in the Present versus in the past or too far into the future, and not looking at the "what ifs".  To work harder to hold onto the positives and helped her review again some techniques for doing that. 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   Interventions: Cognitive Behavioral Therapy and Ego-Supportive  Diagnosis:   ICD-10-CM   1. Adjustment  disorder with mixed anxiety and depressed mood  F43.23     Plan:   Patient to practice strategies discussed in session to help manage her anxiety, concerns for her family,  to decrease her worry. To be more proactive and consistent in her self-care, including eating healthier, getting more consistent sleep, and exercise especially walking, and to work on improving her sleep and getting up schedule, and limiting her watching of  news online and TV, and relying also on her faith and a more active use of prayer (which has helped her in the past). Next appt within in 2 wks.  Mathis Fareeborah Zsofia Prout, LCSW

## 2019-03-13 ENCOUNTER — Other Ambulatory Visit: Payer: Self-pay

## 2019-03-13 ENCOUNTER — Ambulatory Visit (INDEPENDENT_AMBULATORY_CARE_PROVIDER_SITE_OTHER): Payer: 59 | Admitting: Psychiatry

## 2019-03-13 DIAGNOSIS — F4323 Adjustment disorder with mixed anxiety and depressed mood: Secondary | ICD-10-CM | POA: Diagnosis not present

## 2019-03-13 NOTE — Progress Notes (Signed)
[facility] Counselor/Therapist Progress Note  Patient ID: Kim LivingsCatherine Fulton Clements, MRN: [id number],    Date: 03/13/2019   Time Spent:  60 minutes    11:00am to 12:00noon  Treatment Type: Individual Therapy   Virtual Visit Note I connected with patient by a video enabled telemedicine application or telephone, with their informed consent, and verified patient privacy and that I am speaking with the correct person using two identifiers. [facility] at [facility] and patient is at home.   I discussed the limitations, risks, security and privacy concerns of performing psychotherapy and management service by telephone and the availability of in person appointments. I also discussed with the patient that there may be a patient responsible charge related to this service. The patient expressed understanding and agreed to proceed.  I discussed the treatment planning with the patient. The patient was provided an opportunity to ask questions and all were answered. The patient agreed with the plan and demonstrated an understanding of the instructions.   The patient was advised to call  our office if  symptoms worsen or feel they are in a crisis state and need immediate contact.   Reported Symptoms: anxiety, stress, family concerns, concerned about the coronavirus situation and racial unrest, frustration and the uncertainties, some depression ("but less")  Mental Status Exam:  Appearance:   N/A  (telehealth)  Behavior:  sharing  Motor:  Normal  Speech/Language:   Normal Rate  Affect:  N/A  (telehealth)  Mood:  anxious, stressed  Thought process:  Goal directed  Thought content:    WNL  Sensory/Perceptual disturbances:    WNL  Orientation:  oriented to person, place, time/date, situation, day of week, month of year and year  Attention:  Good  Concentration:  Good  Memory:  WNL  Fund of knowledge:   Good  Insight:    Good  Judgment:   Good  Impulse Control:  Good   Risk  Assessment: Danger to Self:  No Self-injurious Behavior: No Danger to Others: No Duty to Warn:no Physical Aggression / Violence:No  Access to Firearms a concern: No  Gang Involvement:No   Subjective:  Patient reporting the symptoms listed above and is most anxious today about the coronavirus issues, the "racial tensions and protests", the safety of her brother who is in [employer] examinerlaw enforcement, and other family concerns.  Very anxious today and talks in detail about each of these concerns.  Rates her anxiety today as an "8" on 1-10 scale.  Looks at some of what is making her anxiety increase right now and she agrees that she needs to be more consistent in limiting her watching of TV news, online and facebook news/posts about local and world crises and concerns.  Also needing to limit conversation about these same topics with other people, especially friend who are over-consumed with the news themselves.  Talked about other strategies to help her manage anxiety, stress, and uncertainties.  Also worked with her on her self-talk and thought patterns, all of which affect her anxiety.  Reviewed strategies (including CBT) to help her manage stressors and anxiety better, reviewed behavior changes that she is to follow through on, and reminded her about changes that need to happen in her self-talk which feeds the anxiety.    Interventions: Cognitive Behavioral Therapy and Ego-Supportive  Diagnosis:   ICD-10-CM   1. Adjustment disorder with mixed anxiety and depressed mood  F43.23     Plan:   Patient to practice strategies discussed in  session to help manage her anxiety, concerns for her family,  to decrease her worry. To be more proactive and consistent in her self-care, including eating healthier, getting more consistent sleep, and exercise especially walking, and to work on improving her sleep and getting up schedule, and limiting her watching of news online and TV, and relying also on her faith and a more  active use of prayer (which has helped her in the past). Next appt within in 2 wks.  Shanon Ace, LCSW

## 2019-03-27 ENCOUNTER — Other Ambulatory Visit: Payer: Self-pay | Admitting: Obstetrics and Gynecology

## 2019-03-27 ENCOUNTER — Ambulatory Visit (INDEPENDENT_AMBULATORY_CARE_PROVIDER_SITE_OTHER): Payer: 59 | Admitting: Psychiatry

## 2019-03-27 ENCOUNTER — Other Ambulatory Visit: Payer: Self-pay

## 2019-03-27 DIAGNOSIS — F4323 Adjustment disorder with mixed anxiety and depressed mood: Secondary | ICD-10-CM

## 2019-03-27 DIAGNOSIS — R928 Other abnormal and inconclusive findings on diagnostic imaging of breast: Secondary | ICD-10-CM

## 2019-03-27 NOTE — Progress Notes (Signed)
[facility] Counselor/Therapist Progress Note  Patient ID: Kim Clements, MRN: [id number],    Date: 03/27/2019   Time Spent:  60 minutes   10:00am to 11:00am  Treatment Type: Individual Therapy   Virtual Visit Note I connected with patient by a video enabled telemedicine application or telephone, with their informed consent, and verified patient privacy and that I am speaking with the correct person using two identifiers. I am at [place] and patient is at home.   I discussed the limitations, risks, security and privacy concerns of performing psychotherapy and management service by telephone and the availability of in person appointments. I also discussed with the patient that there may be a patient responsible charge related to this service. The patient expressed understanding and agreed to proceed.  I discussed the treatment planning with the patient. The patient was provided an opportunity to ask questions and all were answered. The patient agreed with the plan and demonstrated an understanding of the instructions.   The patient was advised to call  our office if  symptoms worsen or feel they are in a crisis state and need immediate contact.   Reported Symptoms: anxiety, stress, family concerns, concerned about the coronavirus situation and racial unrest, frustration and the uncertainties, some depression ("but less")  Mental Status Exam:  Appearance:   N/A  (telehealth)  Behavior:  sharing  Motor:  Normal  Speech/Language:   Normal Rate  Affect:  N/A  (telehealth)  Mood:  anxious, stressed  Thought process:  Goal directed  Thought content:    WNL  Sensory/Perceptual disturbances:    WNL  Orientation:  oriented to person, place, time/date, situation, day of week, month of year and year  Attention:  Good  Concentration:  Good  Memory:  WNL  Fund of knowledge:   Good  Insight:    Good  Judgment:   Good  Impulse Control:  Good   Risk  Assessment: Danger to Self:  No Self-injurious Behavior: No Danger to Others: No Duty to Warn:no Physical Aggression / Violence:No  Access to Firearms a concern: No  Gang Involvement:No   Subjective:  Patient reporting the symptoms listed above and is most anxious today about the coronavirus issues, racial tension , and other family concerns.  More emphasis on the virus situation.  Very anxious today and talks in detail about each of these concerns,  however has made more changes in her habits of watching news online or TV, yet is still exposed to "bad news through conversations with friends".  Rates her anxiety today as an "8 but fluctuates at times" on 1-10 scale.  Looks at some of what is making her anxiety increase right now and she agrees that she needs to be more consistent in limiting her watching of TV news, online and facebook news/posts about local and world crises and concerns.     *Followed up on homework, and is finding it very difficult to change her behaviors and habits that feed her anxiety.  After discussion, we agreed on 3 updated homework assignments that patient agrees to: 1. Limit watching/reading of news to no more than 10 minutes in a.m. and p.m. daily. 2. Limit talking about news with others. 3. "Turn off" and use mindfulness & relaxation techniques already taught in prior sessions.  Talked about other strategies  (including CBT)  to help her manage anxiety, stress, and uncertainties, working  with her on her self-talk and thought patterns, both of which affect her  anxiety.   Interventions: Cognitive Behavioral Therapy and Ego-Supportive  Diagnosis:   ICD-10-CM   1. Adjustment disorder with mixed anxiety and depressed mood  F43.23     Plan:   Patient to practice strategies discussed in session to help manage her anxiety, concerns for her family,  to decrease her worry. To be more proactive and consistent in her self-care, including eating healthier, getting more  consistent sleep, and exercise especially walking, and to work on improving her sleep and getting up schedule, and limiting her watching of news online and TV, and relying also on her faith and a more active use of prayer (which has helped her in the past). Next appt within in 2 wks.  Mathis Fareeborah Shant Hence, LCSW

## 2019-03-28 ENCOUNTER — Ambulatory Visit: Payer: 59

## 2019-03-28 ENCOUNTER — Ambulatory Visit
Admission: RE | Admit: 2019-03-28 | Discharge: 2019-03-28 | Disposition: A | Discharge status: Home / Self Care | Payer: 59 | Source: Ambulatory Visit | Attending: Obstetrics and Gynecology | Admitting: Obstetrics and Gynecology

## 2019-03-28 ENCOUNTER — Other Ambulatory Visit: Payer: Self-pay

## 2019-03-28 DIAGNOSIS — R928 Other abnormal and inconclusive findings on diagnostic imaging of breast: Secondary | ICD-10-CM

## 2019-04-10 ENCOUNTER — Other Ambulatory Visit: Payer: Self-pay

## 2019-04-10 ENCOUNTER — Ambulatory Visit (INDEPENDENT_AMBULATORY_CARE_PROVIDER_SITE_OTHER): Payer: 59 | Admitting: Psychiatry

## 2019-04-10 DIAGNOSIS — F4323 Adjustment disorder with mixed anxiety and depressed mood: Secondary | ICD-10-CM

## 2019-04-10 NOTE — Progress Notes (Signed)
[facility] Counselor/Therapist Progress Note  Patient ID: Kim Clements, MRN: [id number],    Date: 08/04//2020   Time Spent:  60 minutes   11:00am to 12:00noon  Treatment Type: Individual Therapy   Virtual Visit Note I connected with patient by a video enabled telemedicine application or telephone, with their informed consent, and verified patient privacy and that I am speaking with the correct person using two identifiers. I am at [place] and patient is at home.   I discussed the limitations, risks, security and privacy concerns of performing psychotherapy and management service by telephone and the availability of in person appointments. I also discussed with the patient that there may be a patient responsible charge related to this service. The patient expressed understanding and agreed to proceed.  I discussed the treatment planning with the patient. The patient was provided an opportunity to ask questions and all were answered. The patient agreed with the plan and demonstrated an understanding of the instructions.   The patient was advised to call  our office if  symptoms worsen or feel they are in a crisis state and need immediate contact.   Reported Symptoms: anxiety, stress, family concerns, concerned about the coronavirus situation, frustration and the uncertainties, some depression but lessened  Mental Status Exam:  Appearance:   N/A  (telehealth)  Behavior:  sharing  Motor:  Normal  Speech/Language:   Normal Rate  Affect:  N/A  (telehealth)  Mood:  anxious, stressed  Thought process:  Goal directed  Thought content:    WNL  Sensory/Perceptual disturbances:    WNL  Orientation:  oriented to person, place, time/date, situation, day of week, month of year and year  Attention:  Good  Concentration:  Good  Memory:  WNL  Fund of knowledge:   Good  Insight:    Good  Judgment:   Good  Impulse Control:  Good   Risk Assessment: Danger to  Self:  No Self-injurious Behavior: No Danger to Others: No Duty to Warn:no Physical Aggression / Violence:No  Access to Firearms a concern: No  Gang Involvement:No   Subjective:  Patient reporting the symptoms listed above and is most anxious today about the coronavirus issues, being around other people who have been traveling and could have been exposed to the virus, and other family concerns. Another stressor is her daughter being a senior in high school this year and trying to make college decisions. Still a lot of emphasis on the virus situation. Tends to think ahead and imagine what may go wrong, and this gets exaggerated in more stressful times.  Worked with patient more today on this tendency and helping her with additional strategies and increased the suggested schedule for her relaxation exercises, to which she agreed.  Has stuck with her decreasing any TV and Online news/world crises etc watching, and also conversations with family and friends focused on "bad news"--has decreased it some and needing to decrease it further.  The homework below has helped in some ways and we mutually agreed today that patient needed to continue this.   *Followed up on homework, and is finding it very difficult to change her behaviors and habits that feed her anxiety.  After discussion, we agreed on 3 updated homework assignments that patient agrees to: 1. Limit watching/reading of news to no more than 10 minutes in a.m. and p.m. daily. 2. Limit talking about news with others. 3. "Turn off" and use mindfulness & relaxation techniques already taught in  prior sessions.  Reviewed other strategies  (including CBT)  to help her manage anxiety, stress, and uncertainties, working  with her on her self-talk and thought patterns, both of which affect her anxiety.   Interventions: Cognitive Behavioral Therapy and Ego-Supportive  Diagnosis:   ICD-10-CM   1. Adjustment disorder with mixed anxiety and depressed mood   F43.23     Plan:   Patient to practice strategies and homework discussed in session to help manage her anxiety, concerns for her family,  to decrease her worry. To be more proactive and consistent in her self-care, including eating healthier, getting more consistent sleep, and exercise especially walking, and to work on improving her sleep and getting up schedule, and limiting her watching of news online and TV, and relying also on her faith and a more active use of prayer (which has helped her in the past). Next appt within in 2 wks.  Mathis Fareeborah Rick Warnick, LCSW

## 2019-04-25 ENCOUNTER — Ambulatory Visit: Payer: 59 | Admitting: Psychiatry

## 2019-04-26 ENCOUNTER — Other Ambulatory Visit: Payer: Self-pay

## 2019-04-26 ENCOUNTER — Ambulatory Visit (INDEPENDENT_AMBULATORY_CARE_PROVIDER_SITE_OTHER): Payer: 59 | Admitting: Psychiatry

## 2019-04-26 DIAGNOSIS — F4323 Adjustment disorder with mixed anxiety and depressed mood: Secondary | ICD-10-CM

## 2019-04-26 NOTE — Progress Notes (Signed)
[facility] Counselor/Therapist Progress Note  Patient ID: Kim Clements, MRN: [id number],    Date: 08/04//2020   Time Spent:  60 minutes   10:00am to 11:00am  Treatment Type: Individual Therapy   Virtual Visit Note I connected with patient by a video enabled telemedicine application or telephone, with their informed consent, and verified patient privacy and that I am speaking with the correct person using two identifiers. I am at [place] and patient is at home.   I discussed the limitations, risks, security and privacy concerns of performing psychotherapy and management service by telephone and the availability of in person appointments. I also discussed with the patient that there may be a patient responsible charge related to this service. The patient expressed understanding and agreed to proceed.  I discussed the treatment planning with the patient. The patient was provided an opportunity to ask questions and all were answered. The patient agreed with the plan and demonstrated an understanding of the instructions.   The patient was advised to call  our office if  symptoms worsen or feel they are in a crisis state and need immediate contact.   Reported Symptoms: anxiety, stress, family concerns, concerned about the coronavirus situation, frustration and the uncertainties, some depression but lessened  Mental Status Exam:  Appearance:   N/A  (telehealth)  Behavior:  sharing  Motor:   N/A  (telehealth)  Speech/Language:   Normal Rate  Affect:  N/A  (telehealth)  Mood:  anxious, stressed  Thought process:  Goal directed  Thought content:    WNL  Sensory/Perceptual disturbances:    WNL  Orientation:  oriented to person, place, time/date, situation, day of week, month of year and year  Attention:  Good  Concentration:  Good  Memory:  WNL  Fund of knowledge:   Good  Insight:    Good  Judgment:   Good  Impulse Control:  Good   Risk  Assessment: Danger to Self:  No Self-injurious Behavior: No Danger to Others: No Duty to Warn:no Physical Aggression / Violence:No  Access to Firearms a concern: No  Gang Involvement:No   Subjective:   Patient reporting today the symptoms above which are relating to family concerns and her own personal issues.  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  Shares concerns  about her daughter graduating this year and going to college.  Doesn't feel she's ready to be away from home "as she has been homeschooled all her life".  Having a difficult time imagining her daughter being away from home but it willing to acknowledge and work on this some and accept that this is a natural process for her daughter to complete high school and want to attend college and be able to venture out on her own to attend college away from home. Patient is trying to separate herself more from online news and not be reading so much about "all the bad things going on" but is finding this to be a real challenge---is however still working on it and accepts feedback about this.  Remains concerned about the virus issues and "fluctuates as to how much it over-stresses her."    *Followed up on continuation of homework, and is finding it very difficult to change her behaviors and habits that feed her anxiety.  After discussion, we  agreed on 3 continued homework assignments that patient agrees to: 1. Limit watching/reading of news to no more than 10 minutes in a.m. and p.m. daily. 2. Limit talking about news with others. 3. "Turn off" and use mindfulness & relaxation techniques already taught in prior sessions.  Reviewed other strategies  (including CBT)  to help her manage anxiety, stress, and uncertainties, working  with her on her self-talk and thought patterns, both  of which affect her anxiety. She is showing some progress which is encouraging and tends to be more symptomatic when there is a lapse in her using strategies as we've discussed in sessions.  Does not want more medication at this time.  Interventions: Cognitive Behavioral Therapy and Ego-Supportive  Diagnosis:   ICD-10-CM   1. Adjustment disorder with mixed anxiety and depressed mood  F43.23     Plan:   Patient to practice strategies and homework discussed in session to help manage her anxiety, concerns for her family,  to decrease her worry. To be more consistently proactive and consistent in her self-care, including eating healthier, getting more consistent sleep, and exercise especially walking, and to work on improving her sleep and getting up schedule, and limiting her watching of news online and TV, and relying also on her faith and a more active use of prayer (which has helped her in the past). Next appt within in 2 wks.  Mathis Fareeborah Tregan Read, LCSW

## 2019-05-07 ENCOUNTER — Other Ambulatory Visit: Payer: Self-pay | Admitting: Physician Assistant

## 2019-05-10 ENCOUNTER — Ambulatory Visit: Payer: 59 | Admitting: Psychiatry

## 2019-05-15 ENCOUNTER — Other Ambulatory Visit: Payer: Self-pay

## 2019-05-15 ENCOUNTER — Ambulatory Visit (INDEPENDENT_AMBULATORY_CARE_PROVIDER_SITE_OTHER): Payer: 59 | Admitting: Psychiatry

## 2019-05-15 DIAGNOSIS — F4323 Adjustment disorder with mixed anxiety and depressed mood: Secondary | ICD-10-CM

## 2019-05-15 NOTE — Progress Notes (Signed)
[facility] Counselor/Therapist Progress Note  Patient ID: Lemar LivingsCatherine Fulton Leclere, MRN: [id number],    Date: 05/15/2019   Time Spent:  60 minutes    12:00noon to 1:00pm   Treatment Type: Individual Therapy   Virtual Video Visit Note I connected with patient by a video enabled telemedicine application, with their informed consent, and verified patient privacy and that I am speaking with the correct person using two identifiers. [facility].   I discussed the limitations, risks, security and privacy concerns of performing psychotherapy and management service by telephone and the availability of in person appointments. I also discussed with the patient that there may be a patient responsible charge related to this service. The patient expressed understanding and agreed to proceed.  I discussed the treatment planning with the patient. The patient was provided an opportunity to ask questions and all were answered. The patient agreed with the plan and demonstrated an understanding of the instructions.   The patient was advised to call  our office if  symptoms worsen or feel they are in a crisis state and need immediate contact.   Reported Symptoms:  Continued concern re: coronavirus situation, anxiety, family issues, stressed, frustrated, difficulty dealing with uncertainties, depression has remained less  Mental Status Exam:  Appearance:   casual  Behavior:  sharing  Motor:  Normal  Speech/Language:   Normal Rate  Affect:  Anxious  Mood:  anxious, stressed  Thought process:  Goal directed  Thought content:    WNL  Sensory/Perceptual disturbances:    WNL  Orientation:  oriented to person, place, time/date, situation, day of week, month of year and year  Attention:  Good  Concentration:  Good  Memory:  WNL  Fund of knowledge:   Good  Insight:    Good  Judgment:   Good  Impulse Control:  Good   Risk Assessment: Danger to Self:   No Self-injurious Behavior: No Danger to Others: No Duty to Warn:no Physical Aggression / Violence:No  Access to Firearms a concern: No  Gang Involvement:No   Subjective:   Patient today is anxious about several family concerns, the "whole coronavirus situation",  her daughter being a high school senior and looking to attend college away next year, and her brother being in Patent examinerlaw enforcement and dealing with racial tensions. She, husband, and daughter did take a few days trip away with "extreme precautions and isolation as much as possible" and felt it did help them to be away some.  Patient is also dealing with some of hre own personal concerns and seems to be more willing to face those concerns more directly. Has been some better about not watching news on TV and online and trying to limit discussing news with friends who tend to be talking about "upsetting news" a lot.      Reviewed with patient on 05/15/19 and followed up on continuation of homework. Has had some more success, but difficult to maintain. She is to build in more pleasurable activities to include "games that the family enjoy together and setting aside their phones while engaged in these activities.  Also to get some form of exercise at least 3 times weekly, even if it is walking in the neighborhood.  1. Limit TV and online news time to no more than 10 minutes twice daily.  2. Limit talking about news with others, and be able to say to them that she is using these limits for her  own health. 3. "Turn off" and use mindfulness & relaxation techniques already taught in prior sessions.  Is making some progress and remains clear that she does not want more medication.   Interventions: Cognitive Behavioral Therapy and Ego-Supportive  Diagnosis:   ICD-10-CM   1. Adjustment disorder with mixed anxiety and depressed mood  F43.23     Plan:  Treatment Goals: Long Term goal: Reduce overall level, frequency, and intensity of the anxiety  so that daily functioning is not impaired.   (Progressing)- Patient motivated and more intentional in her efforts to recognize/understand triggers to her anxiety.  Short Term goal: Verbalize an understanding of the role that fearful thinking plays in creating fears, excessive worry, and persistent anxiety symptoms. Strategy:  Explore cognitive messages that mediate anxiety response and retrain in adaptive cognitions.  Develop behavioral and cognitive strategies to reduce of eliminate the irrational anxiety. Help client develop healthy self-talk as a means of handling the anxiety. (Progressing)- Patient motivated and working to better understand her fearful thinking ad how it makes her worrying worse and more persistent. Is working to interrupt the fearful thoughts and replace them with thoughts that do not support fear and anxiety.    Next appt within in 2 wks.  Shanon Ace, LCSW

## 2019-05-30 ENCOUNTER — Other Ambulatory Visit: Payer: Self-pay

## 2019-05-30 ENCOUNTER — Ambulatory Visit (INDEPENDENT_AMBULATORY_CARE_PROVIDER_SITE_OTHER): Payer: 59 | Admitting: Psychiatry

## 2019-05-30 DIAGNOSIS — F4323 Adjustment disorder with mixed anxiety and depressed mood: Secondary | ICD-10-CM

## 2019-05-30 NOTE — Progress Notes (Signed)
[facility] Counselor/Therapist Progress Note  Patient ID: Kim Clements, MRN: [id number],    Date: 05/30/2019  Time Spent:  60 minutes   11:00am  To 12:00noon  Virtual Visit Video Note Connected with patient by a video enabled telemedicine/telehealth application or telephone, with their informed consent, and verified patient privacy and that I am speaking with the correct person using two identifiers. I discussed the limitations, risks, security and privacy concerns of performing psychotherapy and management service by telephone and the availability of in person appointments. I also discussed with the patient that there may be a patient responsible charge related to this service. The patient expressed understanding and agreed to proceed. I discussed the treatment planning with the patient. The patient was provided an opportunity to ask questions and all were answered. The patient agreed with the plan and demonstrated an understanding of the instructions. The patient was advised to call  our office if  symptoms worsen or feel they are in a crisis state and need immediate contact.   Therapist Location: [facility] Patient Location: home   Treatment Type: Individual Therapy   Reported Symptoms: anxiety, concerns re: coronavirus continuing  Mental Status Exam:  Appearance:   Casual     Behavior:  Appropriate and Sharing  Motor:  Normal  Speech/Language:   Normal Rate  Affect:  anxious  Mood:  anxious  Thought process:  normal  Thought content:    WNL  Sensory/Perceptual disturbances:    WNL  Orientation:  oriented to person, place, time/date, situation, day of week, month of year and year  Attention:  Good  Concentration:  Good  Memory:  WNL  Fund of knowledge:   Good  Insight:    Good  Judgment:   Good  Impulse Control:  Good   Risk Assessment: Danger to Self:  No Self-injurious Behavior: No Danger to Others: No Duty to Warn:no Physical  Aggression / Violence:No  Access to Firearms a concern: No  Gang Involvement:No   Subjective: Patient today reporting anxiety, some depressive thoughts (mild), and concerns about the ongoing nature of the coronavirus.  She reports some days have gotten "bettter"/less anxious than other days but not necessarily figured out what has made the difference. Had appt yesterday with ob-gyn doctor and they recommended ("for anyone") for her to have blood test done which she did a while back.  Yesterday's appt was for results and hers included some risk of familial breast cancer and talked about recommendations of being checked more thoroughly or more often.   Interventions: Cognitive Behavioral Therapy  Diagnosis:   ICD-10-CM   1. Adjustment disorder with mixed anxiety and depressed mood  F43.23     Plan:  Patient not signing tx plan on computer screen due to [place].  Treatment Goals: Goals may remain on tx plan as patient works to achieve her goals.  Progress will be documented each session in the "Progress" section of Plan.  Long Term goal: Reduce overall level, frequency, and intensity of the anxiety so that daily functioning is not impaired.    Short Term goal: Verbalize an understanding of the role that fearful thinking plays in creating fears, excessive worry, and persistent anxiety symptoms . Strategy:  Explore cognitive messages that mediate anxiety response and retrain in adaptive cognitions.  Develop behavioral and cognitive strategies to reduce of eliminate the irrational anxiety. Help client develop healthy self-talk as a means of handling the anxiety.  (Progressing)- Patient states she is motivated and has  difficult time changing thinking patterns. Discussed her fearful thoughts and difficulty separating herself for online news and friends who "talk about frightening things in the world".  Have already encourage setting tighter limits on the consumption of disturbing news, and also  discussed how everything we hear is not always true. Working to gain more self control and commitment to some positive changes for herself, including less focus on world and area crises, more focus on the positives around [place], leaning more on her faith, and beginning a better pattern of sleep (currently often up til 3:00 am watching tv or reading etc and then sleeping quite late in the a.m.)  Until next appt, she is to monitor her thought patterns related to anxiety and work to interrupt them each time.  Will pick up from here next session.      Return in approx 2 weeks.   Mathis Fare, LCSW

## 2019-05-31 ENCOUNTER — Other Ambulatory Visit: Payer: Self-pay | Admitting: Obstetrics and Gynecology

## 2019-05-31 DIAGNOSIS — Z9189 Other specified personal risk factors, not elsewhere classified: Secondary | ICD-10-CM

## 2019-06-04 ENCOUNTER — Telehealth: Payer: Self-pay | Admitting: Oncology

## 2019-06-04 NOTE — Telephone Encounter (Signed)
Received a referral from Dr. Gaetano Net at [facility] for the high risk breast clinic. MS. Eng returned my call and has been scheduled to see Dr. Jana Hakim on 10/25 at 430pm. Aware to arrive 15 minutes early.

## 2019-06-14 ENCOUNTER — Other Ambulatory Visit: Payer: Self-pay | Admitting: Family Medicine

## 2019-06-14 DIAGNOSIS — M25572 Pain in left ankle and joints of left foot: Secondary | ICD-10-CM

## 2019-06-14 DIAGNOSIS — G8929 Other chronic pain: Secondary | ICD-10-CM

## 2019-07-01 ENCOUNTER — Encounter: Payer: Self-pay | Admitting: Oncology

## 2019-07-01 NOTE — Progress Notes (Signed)
[place]  [phone] [phone]     ID: Kim Clements DOB: April 22, 1966  MR#: [id number]  IXV#:[id number]  Patient Care Team: Briscoe Deutscher, DO as PCP - General (Family Medicine) Tiajuana Amass, MD as Referring Physician (Allergy and Immunology) Salwa Bai, Virgie Dad, MD as Consulting Physician (Oncology) Shanon Ace, LCSW as Consulting Physician (Psychiatry) Everlene Farrier, MD as Consulting Physician (Obstetrics and Gynecology) Chauncey Cruel, MD OTHER MD:  CHIEF COMPLAINT: high risk for breast cancer  CURRENT TREATMENT: Intensified screening   HISTORY OF CURRENT ILLNESS: Kim Clements had routine screening mammography on 03/27/2019 showing a possible abnormality in the right breast. She underwent right diagnostic mammography with tomography at [facility] on 03/28/2019 showing: breast density category B; no evidence for malignancy.  She underwent [facility] myRisk hereditary cancer analysis on 04/16/2019, which showed: negative genetic testing; lifetime risk for having breast cancer of 24.2%.  The patient's subsequent history is as detailed below.   INTERVAL HISTORY: Kim "Tye Maryland" was evaluated in the high risk breast cancer clinic on 07/02/2019.   REVIEW OF SYSTEMS: Kim "Tye [place]" denies unusual headaches, visual changes, nausea, vomiting, stiff neck, dizziness, or gait imbalance. There has been no cough, phlegm production, or pleurisy, no chest pain or pressure, and no change in bowel or bladder habits. The patient denies fever, rash, bleeding, unexplained fatigue or unexplained weight loss.  She does not exercise regularly.  She tells me her main problems are anxiety and symptoms of irritable bowel.  A detailed review of systems was otherwise entirely negative.   PAST MEDICAL HISTORY: Past Medical History:  Diagnosis Date  . Anal fissure   . Anxiety   . Arthritis   . Asthma   . Colon polyps   .  Diverticulosis   . GERD (gastroesophageal reflux disease)   . HLD (hyperlipidemia)   . IBS (irritable bowel syndrome)   Allergies, mild asthma, remote migraines, early cataracts  PAST SURGICAL HISTORY: Past Surgical History:  Procedure Laterality Date  . ABDOMINAL HYSTERECTOMY  2007  . LAPAROSCOPY  1988   Laser. Indication: Endometriosis.  . TONSILLECTOMY  1971  Status post bilateral salpingo-oophorectomy.  FAMILY HISTORY: Family History  Problem Relation Age of Onset  . Breast cancer Paternal Grandmother        all of paternal females  . Breast cancer Paternal Aunt   . Prostate cancer Paternal Grandfather   . Heart disease Paternal Grandfather   . Colon polyps Mother   . Heart disease Mother   . Rheum arthritis Mother   . Osteoarthritis Mother   . Colon polyps Father   . Stroke Father   . Heart disease Maternal Grandfather   . Heart disease Maternal Grandmother   . Colon cancer Neg Hx    The patient's father died at the age of 79 from a ruptured brain aneurysm.  The patient's father's mother had breast cancer in her 82s.  The patient's father had 1 sister, no brothers.  That sister had breast cancer in her 2s.  The patient's father's father, her paternal grandfather had either bladder cancer or prostate cancer.  The patient's mother is living at age 64 as of October 2020.  There is no cancer in that side of the family to the patient's knowledge  GYNECOLOGIC HISTORY:  No LMP recorded. Patient has had a hysterectomy. Menarche: 53 years old Age at first live birth: 53 years old GX P 1 HRT estrogen only, 2 years Hysterectomy? Yes, 2007 BSO? 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  Minette Brine wants to be a Probation officer.  The patient's husband Annie Main works in Radio producer, from home.    ADVANCED DIRECTIVES: In the absence of any documents to the contrary  the patient's husband is her healthcare power of attorney   HEALTH MAINTENANCE: Social History   Tobacco Use  . Smoking status: Never Smoker  . Smokeless tobacco: Never Used  Substance Use Topics  . Alcohol use: No    Alcohol/week: 0.0 standard drinks  . Drug use: No     Colonoscopy: 2017  PAP: 03/2017, normal  Bone density: 03/2015, normal   Allergies  Allergen Reactions  . Erythromycin Rash  . Penicillins Rash  . Sulfa Antibiotics Rash    Current Outpatient Medications  Medication Sig Dispense Refill  . albuterol (PROVENTIL HFA;VENTOLIN HFA) 108 (90 Base) MCG/ACT inhaler Inhale into the lungs every 6 (six) hours as needed for wheezing or shortness of breath.    . AMBULATORY NON FORMULARY MEDICATION Allergy Shots 2 shots per week    . amitriptyline (ELAVIL) 10 MG tablet TAKE 1 TABLET BY MOUTH NIGHTLY 90 tablet 1  . Cholecalciferol (VITAMIN D3) 25 MCG (1000 UT) CAPS Take 1 capsule by mouth daily.    . clidinium-chlordiazePOXIDE (LIBRAX) 5-2.5 MG capsule Take 1 capsule by mouth 3 (three) times daily before meals. Take 1/2 hour before meals. 270 capsule 2  . colestipol (COLESTID) 1 g tablet Take 1-2 tablets (1-2 g total) by mouth daily. 180 tablet 3  . fexofenadine (ALLEGRA) 180 MG tablet Take 180 mg by mouth daily.    . sertraline (ZOLOFT) 100 MG tablet Take 1 tablet (100 mg total) by mouth daily. 90 tablet 1  . VIBERZI 75 MG TABS TAKE 1 TABLET BY MOUTH TWICE A DAY 180 tablet 0  . bismuth subsalicylate (PEPTO BISMOL) 262 MG/15ML suspension Take 30 mLs by mouth as needed.    . calcium carbonate (TUMS - DOSED IN MG ELEMENTAL CALCIUM) 500 MG chewable tablet Chew 1 tablet by mouth daily.    . Lactase (LACTAID PO) Take 1 tablet by mouth as needed.    . Loperamide HCl (IMODIUM A-D PO) Take by mouth as needed.    . Simethicone (GAS-X PO) Take by mouth as needed.     No current facility-administered medications for this visit.     OBJECTIVE: Middle-aged white woman in no acute  distress  Vitals:   07/02/19 1627  BP: 106/75  Clements: 81  Resp: 18  Temp: 98.7 F (37.1 C)  SpO2: 100%     Body mass index is 31.69 kg/m.   Wt Readings from Last 3 Encounters:  07/02/19 170 lb 8 oz (77.3 kg)  01/03/19 162 lb (73.5 kg)  11/01/18 174 lb 4 oz (79 kg)      ECOG FS:1 - Symptomatic but completely ambulatory  Ocular: Sclerae unicteric, pupils round and equal Ear-nose-throat: Wearing a mask Lymphatic: No cervical or supraclavicular adenopathy Lungs no rales or rhonchi Heart regular rate and rhythm Abd soft, nontender, positive bowel sounds MSK no focal spinal tenderness, no joint edema Neuro: non-focal, well-oriented, appropriate affect Breasts: I do not palpate any masses in either breast.  The skin or nipple unremarkable.  Both axillae are benign.   LAB RESULTS:  CMP     Component Value Date/Time   NA 138 11/01/2018 1643   K 3.8 11/01/2018  1643   CL 102 11/01/2018 1643   CO2 29 11/01/2018 1643   GLUCOSE 87 11/01/2018 1643   BUN 18 11/01/2018 1643   CREATININE 1.32 (H) 11/01/2018 1643   CALCIUM 9.6 11/01/2018 1643   PROT 6.9 11/01/2018 1643   ALBUMIN 4.3 11/01/2018 1643   AST 13 11/01/2018 1643   ALT 26 11/01/2018 1643   ALKPHOS 75 11/01/2018 1643   BILITOT 0.4 11/01/2018 1643    No results found for: TOTALPROTELP, ALBUMINELP, A1GS, A2GS, BETS, BETA2SER, GAMS, MSPIKE, SPEI  No results found for: Nils Pyle, [facility]  Lab Results  Component Value Date   WBC 7.9 11/01/2018   NEUTROABS 4.6 11/01/2018   HGB 14.5 11/01/2018   HCT 42.0 11/01/2018   MCV 88.9 11/01/2018   PLT 270.0 11/01/2018    _0 @  No results found for: LABCA2  No components found for: TMHDQQ229  No results for input(s): INR in the last 168 hours.  No results found for: LABCA2  No results found for: NLG921  No results found for: JHE174  No results found for: YCX448  No results found for: CA2729  No components found for: HGQUANT  No  results found for: CEA1 / No results found for: CEA1   No results found for: AFPTUMOR  No results found for: CHROMOGRNA  No results found for: PSA1  No visits with results within 3 Day(s) from this visit.  Latest known visit with results is:  Appointment on 11/01/2018  Component Date Value Ref Range Status  . IgA 11/01/2018 128  68 - 378 mg/dL Final  . (tTG) Ab, IgG 11/01/2018 3  U/mL Final   Comment: .        Value      Interpretation        -----      --------------        <6         No Antibody Detected        > or = 6   Antibody Detected .   . (tTG) Ab, IgA 11/01/2018 1  U/mL Final   Comment: .        Value      Interpretation        -----      --------------        <4         No Antibody Detected        > or = 4   Antibody Detected .   Marland Kitchen Sed Rate 11/01/2018 17  0 - 30 mm/hr Final  . TSH 11/01/2018 2.01  0.35 - 4.50 uIU/mL Final  . Sodium 11/01/2018 138  135 - 145 mEq/L Final  . Potassium 11/01/2018 3.8  3.5 - 5.1 mEq/L Final  . Chloride 11/01/2018 102  96 - 112 mEq/L Final  . CO2 11/01/2018 29  19 - 32 mEq/L Final  . Glucose, Bld 11/01/2018 87  70 - 99 mg/dL Final  . BUN 11/01/2018 18  6 - 23 mg/dL Final  . Creatinine, Ser 11/01/2018 1.32* 0.40 - 1.20 mg/dL Final  . Total Bilirubin 11/01/2018 0.4  0.2 - 1.2 mg/dL Final  . Alkaline Phosphatase 11/01/2018 75  39 - 117 U/L Final  . AST 11/01/2018 13  0 - 37 U/L Final  . ALT 11/01/2018 26  0 - 35 U/L Final  . Total Protein 11/01/2018 6.9  6.0 - 8.3 g/dL Final  . Albumin 11/01/2018 4.3  3.5 - 5.2 g/dL Final  . Calcium 11/01/2018  9.6  8.4 - 10.5 mg/dL Final  . GFR 11/01/2018 42.07* >60.00 mL/min Final  . WBC 11/01/2018 7.9  4.0 - 10.5 K/uL Final  . RBC 11/01/2018 4.73  3.87 - 5.11 Mil/uL Final  . Hemoglobin 11/01/2018 14.5  12.0 - 15.0 g/dL Final  . HCT 11/01/2018 42.0  36.0 - 46.0 % Final  . MCV 11/01/2018 88.9  78.0 - 100.0 fl Final  . MCHC 11/01/2018 34.4  30.0 - 36.0 g/dL Final  . RDW 11/01/2018 13.0  11.5 -  15.5 % Final  . Platelets 11/01/2018 270.0  150.0 - 400.0 K/uL Final  . Neutrophils Relative % 11/01/2018 58.5  43.0 - 77.0 % Final  . Lymphocytes Relative 11/01/2018 26.0  12.0 - 46.0 % Final  . Monocytes Relative 11/01/2018 5.9  3.0 - 12.0 % Final  . Eosinophils Relative 11/01/2018 8.7* 0.0 - 5.0 % Final  . Basophils Relative 11/01/2018 0.9  0.0 - 3.0 % Final  . Neutro Abs 11/01/2018 4.6  1.4 - 7.7 K/uL Final  . Lymphs Abs 11/01/2018 2.1  0.7 - 4.0 K/uL Final  . Monocytes Absolute 11/01/2018 0.5  0.1 - 1.0 K/uL Final  . Eosinophils Absolute 11/01/2018 0.7  0.0 - 0.7 K/uL Final  . Basophils Absolute 11/01/2018 0.1  0.0 - 0.1 K/uL Final    (this displays the last labs from the last 3 days)  No results found for: TOTALPROTELP, ALBUMINELP, A1GS, A2GS, BETS, BETA2SER, GAMS, MSPIKE, SPEI (this displays SPEP labs)  No results found for: KPAFRELGTCHN, LAMBDASER, KAPLAMBRATIO (kappa/lambda light chains)  No results found for: HGBA, HGBA2QUANT, HGBFQUANT, HGBSQUAN (Hemoglobinopathy evaluation)   No results found for: LDH  No results found for: IRON, TIBC, IRONPCTSAT (Iron and TIBC)  No results found for: FERRITIN  Urinalysis No results found for: COLORURINE, APPEARANCEUR, LABSPEC, PHURINE, GLUCOSEU, HGBUR, BILIRUBINUR, KETONESUR, PROTEINUR, UROBILINOGEN, NITRITE, LEUKOCYTESUR   STUDIES: No results found.  ELIGIBLE FOR AVAILABLE RESEARCH PROTOCOL: no  ASSESSMENT: 53 y.o. [place], [place] woman with a lifetime risk of breast cancer in the 24% range per the myriad risk assessment protocol  (1) genetics testing through the myriad BRCA analysis and my risk testing 04/16/2019 showed no clinically significant mutations.  (2) risk reduction strategies discussed with the patient 07/02/2019  (a) considering bilateral reduction mammoplasty's  (b) considering tamoxifen  (c) considering an intensified exercise and improve diet program  (3) intensified screening:  (a) initial MRI of the  breast scheduled for December 2020  (b) mammography and breast MRI alternating every 6 months to follow-up  PLAN: I spent approximately 50 minutes face to face with Kim Clements with more than 50% of that time spent in counseling and coordination of care. Specifically we reviewed the biology of the patient's diagnosis and the specifics of her situation.   She understands the average lifetime risk for breast cancer in an American woman is in the 12% range.  Risk is twice that, in the 24% range.  I emphasized that this means she has a 75% chance of not developing breast cancer in her lifetime.  Furthermore it was important to remember that developing breast cancer and dying from breast cancer are to completely different things.  We are not talking about her risk of dying from breast cancer which is much lower.  [name]'s breast cancer risk however is elevated.  She has essentially two ways of lowering that risk. One of them is surgery.  We generally do not recommend bilateral mastectomies in this setting, but she has some interest  in risk reduction simply because her breasts are so heavy.  That would have some effect in reducing her risk.  A second, more frequently chosen risk reduction approach would be to take anti-estrogens for 5 years.  Tamoxifen for example if taken for 5 years would cut the risk of developing a new breast cancer in half.  We then discussed the possible toxicities, side effects and complications of  tamoxifen .  This information was given to [facility] in writing.  After this discussion she is not sure whether or not she wishes to proceed with antiestrogens.  A third way to reduce risk which is not breast cancer specific is a regular exercise program and and improve diet.  My recommendation is 45 minutes of exercise 5 times a week.  She can break the 45 minutes up, doing perhaps 20 minutes in the morning and 20 or 20 5 in the evening.  This does not have to be very strenuous.  The effect of this  would be to lower the risk of cancer in general by 2 or 3%.  Of course there are also general health benefits, in terms of cardiovascular health, mood, and weight reduction.  We discussed diet in general as well.  My recommendation was that she increase vegetables and decrease carbs in her diet.  She plans to meet with one of her friends who is a nutritionist to refine these recommendations.  We then discussed intensified screening. This would mean adding a yearly MRI to yearly mammography with tomography. This approach greatly increases sensitivity. Any breast cancer that may develop should be found at the earliest possible stage.  The main concern with this approach is the possibility of "false positives " requiring repeated studies, biopsies, etc.  Also there are issues regarding cost even if partial insurance coverage is obtained.  She has already discussed this with Dr. Gertie Fey and she is followed she had already been scheduled for breast MRI, but since we did not find that to be the case I have gone ahead and placed in an order for her.  After this discussion, Kim Clements is ready to move on with intensified screening.  She has information regarding reduction mammoplasty and tamoxifen and she may follow-up on those options if she wishes to pursue them under Dr. Gertie Fey.  I have written for breast MRI to be done in December to facilitate the start of intensified screening, but this is something that Dr. Gaetano Net is expert at.  Accordingly I have not made a return appointment here for [facility] but I will be glad to see her again at any time in the future if I can be of help with her plans.   Chauncey Cruel, MD   07/02/2019 5:10 PM Medical Oncology and Hematology [facility] [place], [place] [address] Tel. [phone]    Fax. [phone]   This document serves as a record of services personally performed by Lurline Del, MD. It was created on his behalf by Wilburn Mylar, a trained medical scribe. The creation of this record is based on the scribe's personal observations and the provider's statements to them.   I, Lurline Del MD, have reviewed the above documentation for accuracy and completeness, and I agree with the above.

## 2019-07-02 ENCOUNTER — Inpatient Hospital Stay: Payer: 59 | Attending: Oncology | Admitting: Oncology

## 2019-07-02 ENCOUNTER — Other Ambulatory Visit: Payer: Self-pay

## 2019-07-02 DIAGNOSIS — F419 Anxiety disorder, unspecified: Secondary | ICD-10-CM | POA: Diagnosis not present

## 2019-07-02 DIAGNOSIS — K219 Gastro-esophageal reflux disease without esophagitis: Secondary | ICD-10-CM | POA: Insufficient documentation

## 2019-07-02 DIAGNOSIS — K5900 Constipation, unspecified: Secondary | ICD-10-CM | POA: Insufficient documentation

## 2019-07-02 DIAGNOSIS — J45909 Unspecified asthma, uncomplicated: Secondary | ICD-10-CM | POA: Insufficient documentation

## 2019-07-02 DIAGNOSIS — Z79899 Other long term (current) drug therapy: Secondary | ICD-10-CM | POA: Diagnosis not present

## 2019-07-02 DIAGNOSIS — Z1239 Encounter for other screening for malignant neoplasm of breast: Secondary | ICD-10-CM | POA: Diagnosis not present

## 2019-07-02 DIAGNOSIS — Z9189 Other specified personal risk factors, not elsewhere classified: Secondary | ICD-10-CM | POA: Diagnosis not present

## 2019-07-02 DIAGNOSIS — Z803 Family history of malignant neoplasm of breast: Secondary | ICD-10-CM | POA: Diagnosis not present

## 2019-07-02 DIAGNOSIS — M129 Arthropathy, unspecified: Secondary | ICD-10-CM | POA: Insufficient documentation

## 2019-07-02 DIAGNOSIS — Z1501 Genetic susceptibility to malignant neoplasm of breast: Secondary | ICD-10-CM | POA: Insufficient documentation

## 2019-07-02 DIAGNOSIS — Z86010 Personal history of colonic polyps: Secondary | ICD-10-CM | POA: Diagnosis not present

## 2019-07-02 DIAGNOSIS — E785 Hyperlipidemia, unspecified: Secondary | ICD-10-CM | POA: Insufficient documentation

## 2019-07-03 ENCOUNTER — Ambulatory Visit: Payer: 59 | Admitting: Psychiatry

## 2019-07-16 ENCOUNTER — Ambulatory Visit (INDEPENDENT_AMBULATORY_CARE_PROVIDER_SITE_OTHER): Payer: 59 | Admitting: Psychiatry

## 2019-07-16 ENCOUNTER — Other Ambulatory Visit: Payer: Self-pay

## 2019-07-16 DIAGNOSIS — F4323 Adjustment disorder with mixed anxiety and depressed mood: Secondary | ICD-10-CM

## 2019-07-16 NOTE — Progress Notes (Signed)
[facility] Counselor/Therapist Progress Note  Patient ID: Kim Clements, MRN: [id number],    Date: 07/16/2019  Time Spent: 60 minutes  4:00pm to 5:00pm  Virtual Visit with Video Note Connected with patient by a video enabled telemedicine/telehealth application or telephone, with their informed consent, and verified patient privacy and that I am speaking with the correct person using two identifiers. I discussed the limitations, risks, security and privacy concerns of performing psychotherapy and management service by telephone and the availability of in person appointments. I also discussed with the patient that there may be a patient responsible charge related to this service. The patient expressed understanding and agreed to proceed. I discussed the treatment planning with the patient. The patient was provided an opportunity to ask questions and all were answered. The patient agreed with the plan and demonstrated an understanding of the instructions. The patient was advised to call  our office if  symptoms worsen or feel they are in a crisis state and need immediate contact.   Therapist Location: [facility] Patient Location: home  Treatment Type: Individual Therapy  Reported Symptoms: anxiety, some depression, nervousness, health concerns within family  Mental Status Exam:  Appearance:   Casual     Behavior:  Appropriate and Sharing  Motor:  Normal  Speech/Language:   Normal Rate  Affect:  anxious, some depression  Mood:  anxious and depressed  Thought process:  normal  Thought content:    WNL  Sensory/Perceptual disturbances:    WNL  Orientation:  oriented to person, place, time/date, situation, day of week, month of year and year  Attention:  Good  Concentration:  Good  Memory:  Patient reports some short term memory issues and feels Zoloft may be a factor.  Fund of knowledge:   Good  Insight:    Good  Judgment:   Good  Impulse Control:  Good    Risk Assessment: Danger to Self:  No Self-injurious Behavior: No Danger to Others: No Duty to Warn:no Physical Aggression / Violence:No  Access to Firearms a concern: No  Gang Involvement:No   Subjective:  Patient today reporting anxiety heightening and some depression. Coronavirus and all the political and world crises have aggravated her anxiety. Hard to set limits on facebook messages that are negative and very anxiety-provoking. I have encouraged her to set those limits.  Interventions: Cognitive Behavioral Therapy and Solution-Oriented/Positive Psychology  Diagnosis:   ICD-10-CM   1. Adjustment disorder with mixed anxiety and depressed mood  F43.23      Plan: Patient not signing tx plan on computer screen due to [place].  Treatment Goals: Goals may remain on tx plan as patient works to achieve her goals.  Progress will be documented each session in the "Progress" section of Plan.  Long Term goal: Reduce overall level, frequency, and intensity of the anxiety so that daily functioning is not impaired.   Short Term goal: Verbalize an understanding of the role that fearful thinking plays in creating fears, excessive worry, and persistent anxiety symptoms . Strategy:  Explore cognitive messages that mediate anxiety response and retrain in adaptive cognitions.  Develop behavioral and cognitive strategies to reduce of eliminate the irrational anxiety. Help client develop healthy self-talk as a means of handling the anxiety.  (Progressing)- Patient very anxious today and needing to process some of her fearful thinking.  Admits to being on facebook a lot and friends sharing a lot of fear-based messages with her.  We discussed the lack of validity  of these messages as well as the detrimental effects on patient.  She did agree to get off facebook for now, with no specific time limit set.  Processed some of her recent fears and then stepped back to challenge them and their likely  accuracy. States she can see how those messages cause her to spiral some with her anxiety, and admits it will be difficult to her to not be on facebook checking them out.  Some of her family have expressed concern to her about this also.  It is progress for this patient to be more open and honest today about her increased anxiety and a big source of some of it.  Also working to improve some other healthy habits including improved nutrition and sleep patterns.  Goal review and progress noted with patient.   Next appt within 2 weeks.   Mathis Fare, LCSW

## 2019-07-30 ENCOUNTER — Other Ambulatory Visit: Payer: Self-pay

## 2019-07-30 ENCOUNTER — Ambulatory Visit (INDEPENDENT_AMBULATORY_CARE_PROVIDER_SITE_OTHER): Payer: 59 | Admitting: Psychiatry

## 2019-07-30 DIAGNOSIS — F4323 Adjustment disorder with mixed anxiety and depressed mood: Secondary | ICD-10-CM

## 2019-07-30 NOTE — Progress Notes (Signed)
[facility] Counselor/Therapist Progress Note  Patient ID: Kim Clements, MRN: [id number],    Date: 07/30/2019  Time Spent: 60 minutes 4:00pm to 5:00p  Virtual Visit with Video Note Connected with patient by a video enabled telemedicine/telehealth application or telephone, with their informed consent, and verified patient privacy and that I am speaking with the correct person using two identifiers. I discussed the limitations, risks, security and privacy concerns of performing psychotherapy and management service by telephone and the availability of in person appointments. I also discussed with the patient that there may be a patient responsible charge related to this service. The patient expressed understanding and agreed to proceed. I discussed the treatment planning with the patient. The patient was provided an opportunity to ask questions and all were answered. The patient agreed with the plan and demonstrated an understanding of the instructions. The patient was advised to call  our office if  symptoms worsen or feel they are in a crisis state and need immediate contact.   Therapist Location: [facility] Patient Location: home  Treatment Type: Individual Therapy  Reported Symptoms:  Anxiety, fearfulness and assumptions based on fears, some depression, "gone back to worrying"  Mental Status Exam:  Appearance:   anxiety, depression , fearfulness     Behavior:  Appropriate and Sharing  Motor:  Normal  Speech/Language:   Clear and Coherent  Affect:  anxious  Mood:  anxious and depressed  Thought process:  goal directed  Thought content:    WNL  Sensory/Perceptual disturbances:    WNL  Orientation:  oriented to person, place, time/date, situation, day of week, month of year and year  Attention:  Good  Concentration:  Good  Memory:  WNL, later admitted that with her anxiety up, she has had more forgetfulness  Fund of knowledge:   Good  Insight:    Good   Judgment:   Good  Impulse Control:  Fair   Risk Assessment: Danger to Self:  No Self-injurious Behavior: No Danger to Others: No Duty to Warn:no Physical Aggression / Violence:No  Access to Firearms a concern: No  Gang Involvement:No   Subjective:  Patient today reports increased anxiety, a lot of which is relate to fears and ongoing pandemic.  Anxiety, some depression, fearfulness and assumptions based on fears.  Interventions: Cognitive Behavioral Therapy and Solution-Oriented/Positive Psychology  Diagnosis:   ICD-10-CM   1. Adjustment disorder with mixed anxiety and depressed mood  F43.23     Plan: Patient not signing tx plan on computer screen due to COVID.  Treatment Goals: Goals may remain on tx plan as patient works to achieve her goals. Progress will be documented each session in the "Progress" section of Plan.  Long Term goal: Reduce overall level, frequency, and intensity of the anxiety so that daily functioning is not impaired.   Short Term goal: Verbalize an understanding of the role that fearful thinking plays in creating fears, excessive worry, and persistent anxiety symptoms . Strategy:  Explore cognitive messages that mediate anxiety response and retrain in adaptive cognitions.  Develop behavioral and cognitive strategies to reduce of eliminate the irrational anxiety. Help client develop healthy self-talk as a means of handling the anxiety.  (Progressing)- Patient's anxiety has increased so we worked very intentionally on her short and long term goals.  Focused on her increased worry also and she was able to share the negative impacts of worry and excessive anxiety.  Processed fears. Looked at what is in her control and  what is not.  Needing to look for what may go right versus wrong, and Stay in the present.  Discussed the negative effects of excessive worry and anxiety and how they do nothing to help.  Worked with patient at length on decreasing her  anxiousness and encouraged good self-care including exercise, being outside more, contact with friends virtually, improved nutrition, using her faith as support, relaxation exercises including slow controlled deep breathing, and positive self-talk. Has backed off of facebook which I advised last session and that had helped "a little".  Encouraged her to remain off it and excessive news watching on tv and online as that always increases her symptoms. We made a list which patient wrote down to help guide her thoughts, which will also influence how she is feeling and some of the behaviors that follow. She seemed to related strongly to the written list and feels it will help. Goal review and progress/efforts noted with patient.   Next appt within 2 weeks.    Shanon Ace, LCSW

## 2019-08-06 IMAGING — MG DIGITAL DIAGNOSTIC UNILATERAL RIGHT MAMMOGRAM WITH TOMO AND CAD
4 series · 4 of 12 positions shown · non-contrast
Comparison: 03/22/2019 and earlier
COMPARISON: 03/22/2019 and earlier

Addendum:
CLINICAL DATA: The patient returns after screening study for
evaluation of possible RIGHT breast asymmetry.

EXAM:
DIGITAL DIAGNOSTIC UNILATERAL RIGHT MAMMOGRAM WITH CAD AND TOMO

[R MLO synth-2D]
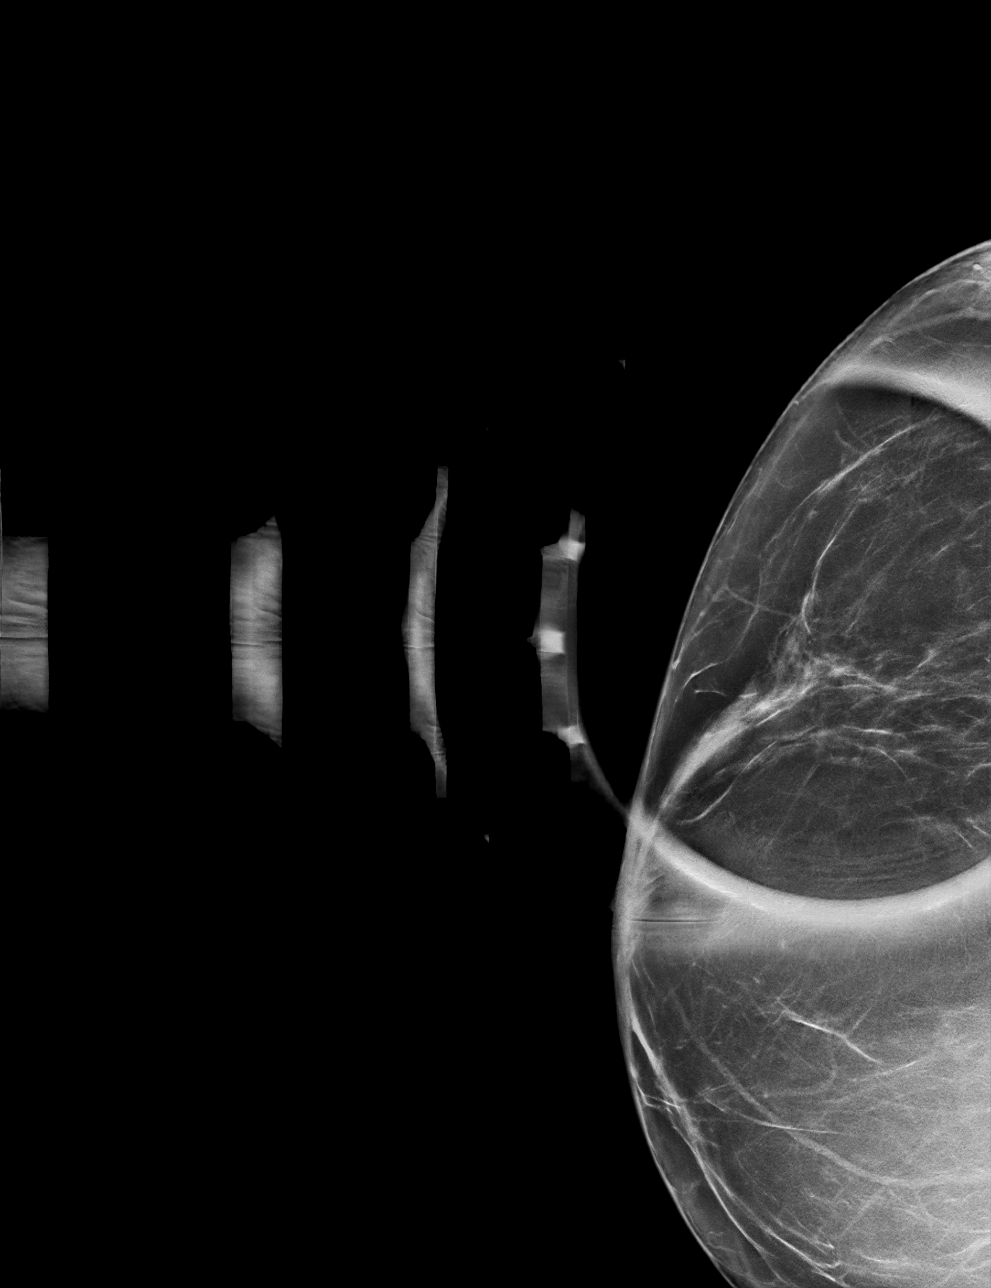

[R ML synth-2D]
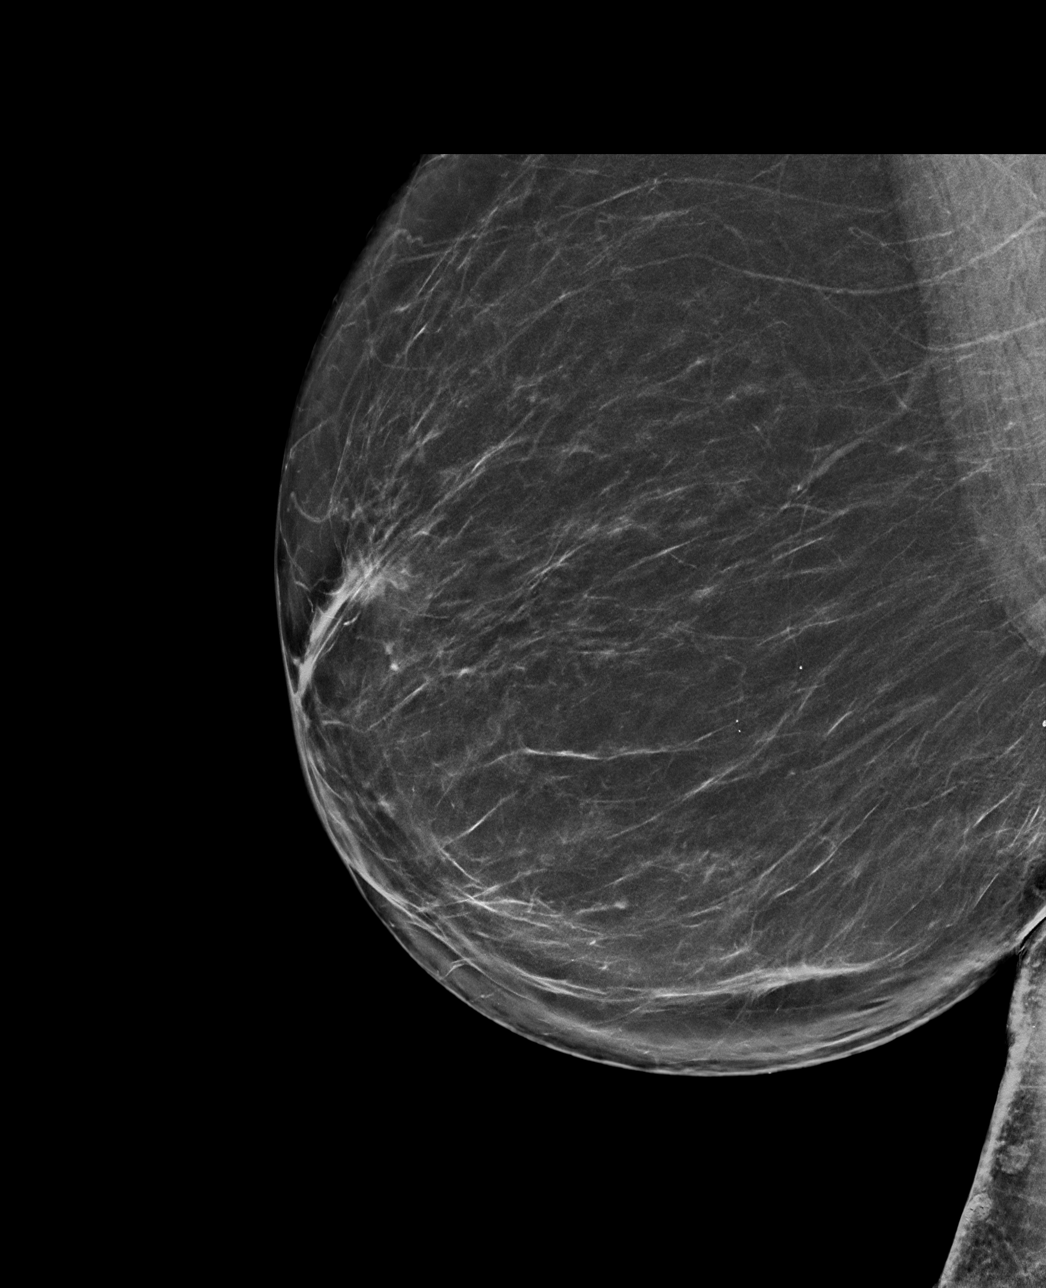

[R ML tomo · tomo slice 39/77.0]
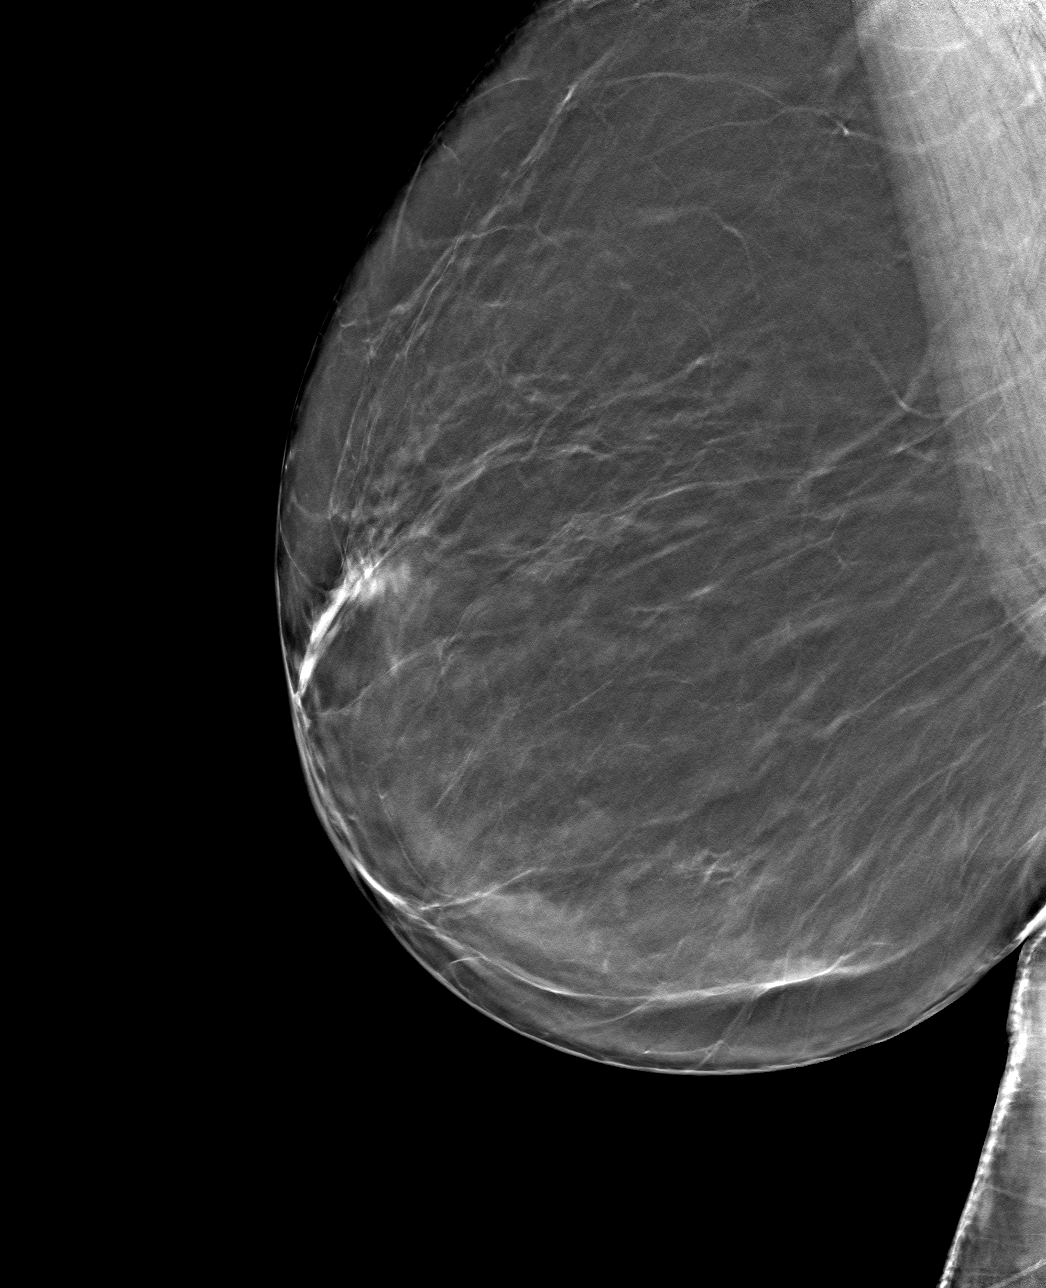

[R MLO tomo · tomo slice 35/69.0]
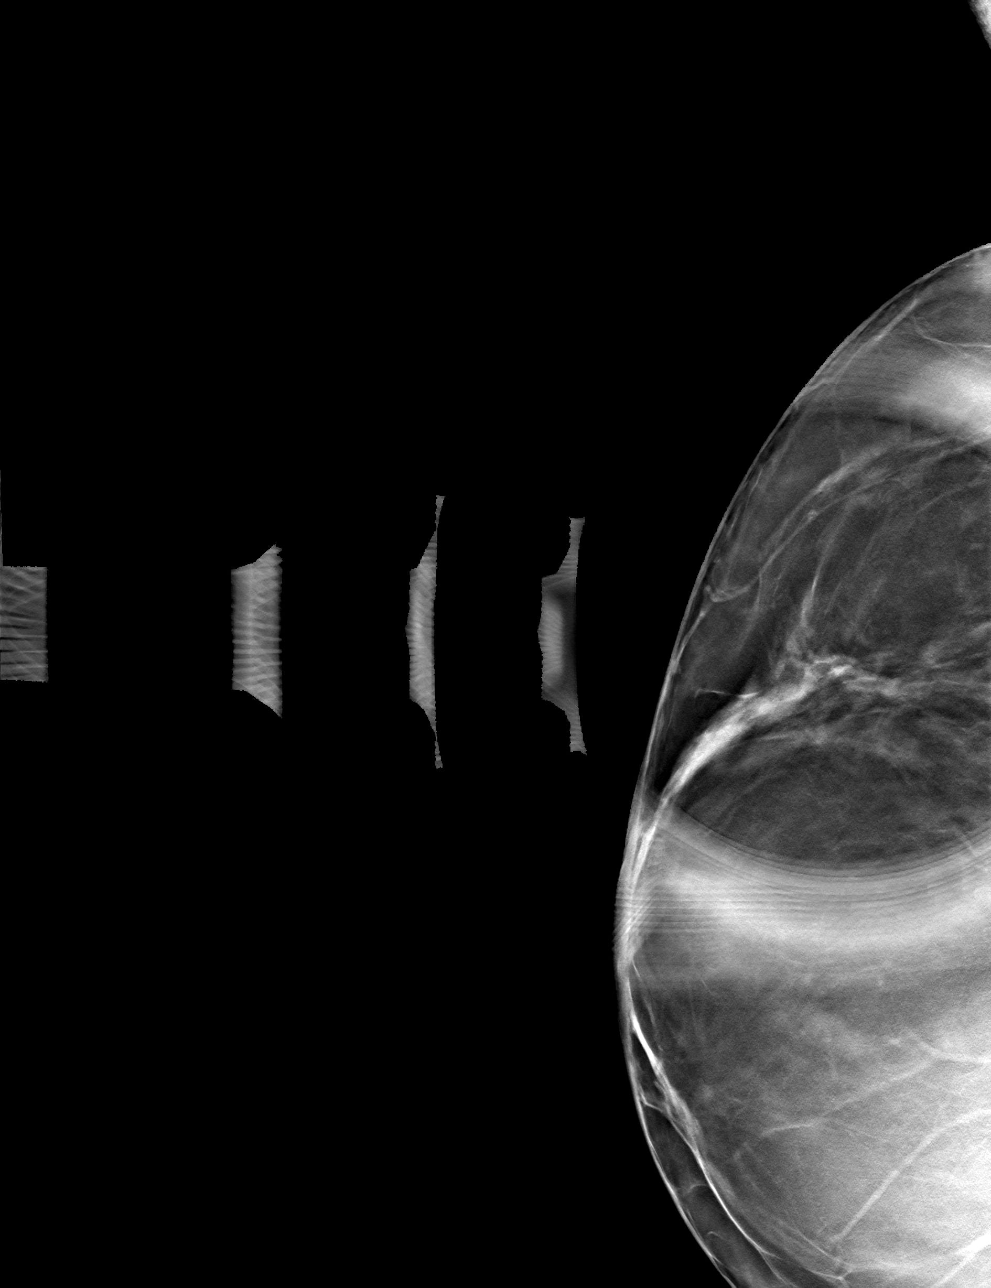

[4 of 12 positions shown; findings below may reference images not displayed]

ACR Breast Density Category b: There are scattered areas of
fibroglandular density.
FINDINGS: Additional 2-D and 3-D images are performed. No persistent
abnormality is identified in the UPPER portion of the RIGHT breast.
No suspicious mass, distortion, or microcalcifications are
identified to suggest presence of malignancy.

Mammographic images were processed with CAD.
IMPRESSION: No mammographic evidence for malignancy.

RECOMMENDATION:
Insert screen

I have discussed the findings and recommendations with the patient.
Results were also provided in writing at the conclusion of the
visit. If applicable, a reminder letter will be sent to the patient
regarding the next appointment.

BI-RADS CATEGORY  1: Negative.

ADDENDUM:
Recommendation:  Screening mammogram in one year.(Code:[id number]-[id number])

*** End of Addendum ***
ACR Breast Density Category b: There are scattered areas of
fibroglandular density.
FINDINGS: Additional 2-D and 3-D images are performed. No persistent
abnormality is identified in the UPPER portion of the RIGHT breast.
No suspicious mass, distortion, or microcalcifications are
identified to suggest presence of malignancy.

Mammographic images were processed with CAD.
IMPRESSION: No mammographic evidence for malignancy.

RECOMMENDATION:
Insert screen

I have discussed the findings and recommendations with the patient.
Results were also provided in writing at the conclusion of the
visit. If applicable, a reminder letter will be sent to the patient
regarding the next appointment.

BI-RADS CATEGORY  1: Negative.

## 2019-08-08 ENCOUNTER — Other Ambulatory Visit: Payer: Self-pay | Admitting: Physician Assistant

## 2019-08-13 ENCOUNTER — Ambulatory Visit (INDEPENDENT_AMBULATORY_CARE_PROVIDER_SITE_OTHER): Payer: 59 | Admitting: Psychiatry

## 2019-08-13 ENCOUNTER — Other Ambulatory Visit: Payer: Self-pay

## 2019-08-13 DIAGNOSIS — F411 Generalized anxiety disorder: Secondary | ICD-10-CM

## 2019-08-13 NOTE — Progress Notes (Signed)
[facility] Counselor/Therapist Progress Note  Patient ID: Kim Clements, MRN: [id number],    Date: 08/13/2019  Time Spent: 60 minutes 3:00pm to 4:00pm  Virtual Visit with Video Note Connected with patient by a video enabled telemedicine/telehealth application or telephone, with their informed consent, and verified patient privacy and that I am speaking with the correct person using two identifiers. I discussed the limitations, risks, security and privacy concerns of performing psychotherapy and management service by telephone and the availability of in person appointments. I also discussed with the patient that there may be a patient responsible charge related to this service. The patient expressed understanding and agreed to proceed. I discussed the treatment planning with the patient. The patient was provided an opportunity to ask questions and all were answered. The patient agreed with the plan and demonstrated an understanding of the instructions. The patient was advised to call  our office if  symptoms worsen or feel they are in a crisis state and need immediate contact.   Therapist Location: [facility] Patient Location: home  Treatment Type: Individual Therapy  Reported Symptoms:  anxiety  Mental Status Exam:  Appearance:   Casual     Behavior:  Appropriate and Sharing  Motor:  Normal  Speech/Language:   Normal Rate  Affect:  anxious, some depression, frustration with COVID and trying to follow better nutritional changes  Mood:  anxious  Thought process:  normal  Thought content:    WNL  Sensory/Perceptual disturbances:    WNL  Orientation:  oriented to person, place, time/date, situation, day of week, month of year and year  Attention:  Good  Concentration:  Fair and gets distracted on tangents sometimes  Memory:  some forgetfulness and worse when anxious  Fund of knowledge:   Good  Insight:    Good  Judgment:   Good  Impulse Control:  Fair    Risk Assessment: Danger to Self:  No Self-injurious Behavior: No Danger to Others: No Duty to Warn:no Physical Aggression / Violence:No  Access to Firearms a concern: No  Gang Involvement:No   Subjective: Patient today reporting anxiety, frustration with COVID and dietary changes, and some depression.  Anxiety has been up and down some since past session.  Keeping notebook from session notes so she can review them. Still finds it difficult to be calm and not be anxious.  Still staying up way too late at night and this is not helping her with her goals.  Talked about sleep and her need to make some changes.  Interventions: Cognitive Behavioral Therapy and Solution-Oriented/Positive Psychology  Diagnosis:   ICD-10-CM   1. Generalized anxiety disorder  F41.1     Plan: Patient not signing tx plan on computer screen due to COVID.  Treatment Goals: Goals may remain on tx plan as patient works to achieve her goals. Progress will be documented each session in the "Progress" section of Plan.  Long Term goal: Reduce overall level, frequency, and intensity of the anxiety so that daily functioning is not impaired.   Short Term goal: Verbalize an understanding of the role that fearful thinking plays in creating fears, excessive worry, and persistent anxiety symptoms . Strategy:  Explore cognitive messages that mediate anxiety response and retrain in adaptive cognitions.  Develop behavioral and cognitive strategies to reduce of eliminate the irrational anxiety. Help client develop healthy self-talk as a means of handling the anxiety.  (Progressing)- Patient showing some small improvement in remaining focused on her goals and changes  to be made.   Easy to go off on tangents but does respond pretty well to re-direction and talks faster which she acknowledged that she gets to talking so quickly that she has to keep up with herself and forgets more things as she doesn't really pay close  attention but rather think.  Talked and confronted some of the  things that keep her from making progress and she was very accepting. Shared that she has gone back on facebook but it being very careful not to be absorbed "into bad news and other things that disturb her".  Has written homework assignment re: questions, self-monitoring, thought interruption and replacment, and deep breathing exercises.  Think she is better understanding of her anxiety and what all contributes to it, as well as what all she can to do to help reduce it.  Goal review and progress/efforts noted.     Next appt within 2 weeks.  Shanon Ace, LCSW

## 2019-08-24 ENCOUNTER — Ambulatory Visit: Payer: 59 | Admitting: Psychiatry

## 2019-08-27 ENCOUNTER — Ambulatory Visit (INDEPENDENT_AMBULATORY_CARE_PROVIDER_SITE_OTHER): Payer: 59 | Admitting: Psychiatry

## 2019-08-27 DIAGNOSIS — F411 Generalized anxiety disorder: Secondary | ICD-10-CM

## 2019-08-27 NOTE — Progress Notes (Signed)
[facility] Counselor/Therapist Progress Note  Patient ID: Kim Clements, MRN: [id number],    Date: 08/27/2019  Time Spent: 60 minutes  3:00pm to 4:00pm  Virtual Visit with Video Note Connected with patient by a video enabled telemedicine/telehealth application or telephone, with their informed consent, and verified patient privacy and that I am speaking with the correct person using two identifiers. I discussed the limitations, risks, security and privacy concerns of performing psychotherapy and management service by telephone and the availability of in person appointments. I also discussed with the patient that there may be a patient responsible charge related to this service. The patient expressed understanding and agreed to proceed. I discussed the treatment planning with the patient. The patient was provided an opportunity to ask questions and all were answered. The patient agreed with the plan and demonstrated an understanding of the instructions. The patient was advised to call  our office if  symptoms worsen or feel they are in a crisis state and need immediate contact.   Therapist Location: [facility] Patient Location: home   Treatment Type: Individual Therapy  Reported Symptoms: anxiety, some depression, stressed  Mental Status Exam:  Appearance:   Casual     Behavior:  Appropriate and Sharing  Motor:  Normal  Speech/Language:   Normal Rate  Affect:  Depressed and anxious, stressed  Mood:  anxious and depressed  Thought process:  goal directed  Thought content:    WNL  Sensory/Perceptual disturbances:    WNL  Orientation:  oriented to person, place, time/date, situation, day of week, month of year and year  Attention:  Good  Concentration:  Fair  Memory:  some forgetfulness  Fund of knowledge:   Fair  Insight:    Fair  Judgment:   Good  Impulse Control:  Good   Risk Assessment: Danger to Self:  No Self-injurious Behavior: No Danger to  Others: No Duty to Warn:no Physical Aggression / Violence:No  Access to Firearms a concern: No  Gang Involvement:No   Subjective:  Patient today reporting anxiety, some depression, and feeling stressed with the continued virus concerns and progression, and worried about family members.  Interventions: Cognitive Behavioral Therapy and Solution-Oriented/Positive Psychology  Diagnosis:   ICD-10-CM   1. Generalized anxiety disorder  F41.1     Plan: Patient not signing tx plan on computer screen due to [place].  Treatment Goals: Goals may remain on tx plan as patient works to achieve her goals. Progress will be documented each session in the "Progress" section of Plan.  Long Term goal: Reduce overall level, frequency, and intensity of the anxiety so that daily functioning is not impaired.   Short Term goal: Verbalize an understanding of the role that fearful thinking plays in creating fears, excessive worry, and persistent anxiety symptoms . Strategy:  Explore cognitive messages that mediate anxiety response and retrain in adaptive cognitions.  Develop behavioral and cognitive strategies to reduce of eliminate the irrational anxiety. Help client develop healthy self-talk as a means of handling the anxiety.  (Progressing)- Patient struggling with her goals but is still working towards them and agrees that they are "on target" for her.  Still will often go off on tangents and responds well when I call her on it and help her slow down and stick with one subject at the time.  Admits she does this sometimes as she gets more anxious, just not as self-aware. Is following through on some improved nutrition, a positive impact on her health.  Discussed short term goal re: the role that fearful thinking plays in creating fears, excessive worry, and anxiety symptoms that can become persistent. Worked more intentionally on her resistance to "letting go" and understanding that the fearful thinking  does not protect her from anything, and in fact, heightens her stress and anxiety.  Patient has a very long history of anxiety and worry but is making some effort, although it is difficult.  States she is trying to rely on her faith more and that is difficult as well.  Goal review and progress noted.   Next appt within 2 weeks.   Mathis Fare, LCSW

## 2019-09-05 ENCOUNTER — Ambulatory Visit: Payer: 59 | Admitting: Psychiatry

## 2019-09-06 ENCOUNTER — Other Ambulatory Visit: Payer: Self-pay | Admitting: Oncology

## 2019-09-10 ENCOUNTER — Other Ambulatory Visit: Payer: Self-pay | Admitting: Oncology

## 2019-09-11 ENCOUNTER — Ambulatory Visit: Payer: 59 | Admitting: Psychiatry

## 2019-09-11 ENCOUNTER — Ambulatory Visit (INDEPENDENT_AMBULATORY_CARE_PROVIDER_SITE_OTHER): Payer: 59 | Admitting: Psychiatry

## 2019-09-11 DIAGNOSIS — F411 Generalized anxiety disorder: Secondary | ICD-10-CM | POA: Diagnosis not present

## 2019-09-11 NOTE — Progress Notes (Signed)
[facility] Counselor/Therapist Progress Note  Patient ID: Kim Clements, MRN: [id number],    Date: 09/11/2019  Time Spent: 60 minutes  3:00pm to 4:00pm  Virtual Visit with Video Note Connected with patient by a video enabled telemedicine/telehealth application or telephone, with their informed consent, and verified patient privacy and that I am speaking with the correct person using two identifiers. I discussed the limitations, risks, security and privacy concerns of performing psychotherapy and management service by telephone and the availability of in person appointments. I also discussed with the patient that there may be a patient responsible charge related to this service. The patient expressed understanding and agreed to proceed. I discussed the treatment planning with the patient. The patient was provided an opportunity to ask questions and all were answered. The patient agreed with the plan and demonstrated an understanding of the instructions. The patient was advised to call  our office if  symptoms worsen or feel they are in a crisis state and need immediate contact.   Therapist Location: [facility] Patient Location: home   Treatment Type: Individual Therapy  Reported Symptoms: anxiety, "worries", some sadness and frustration at world situation  Mental Status Exam:  Appearance:   Casual     Behavior:  Appropriate and Sharing  Motor:  Normal  Speech/Language:   Normal Rate  Affect:  anxious  Mood:  anxious  Thought process:  normal  Thought content:    WNL  Sensory/Perceptual disturbances:    WNL  Orientation:  oriented to person, place, time/date, situation, day of week, month of year and year  Attention:  Good  Concentration:  Good  Memory:  some forgetfulness sometimes  Fund of knowledge:   Good  Insight:    Good  Judgment:   Good  Impulse Control:  Good   Risk Assessment: Danger to Self:  No Self-injurious Behavior: No Danger to  Others: No Duty to Warn:no Physical Aggression / Violence:No  Access to Firearms a concern: No  Gang Involvement:No   Subjective: Patient reports anxiety, some lowered motivation, sad and frustrated at world situation.  Trying to eat healthier and be more organized.  Easily postpones tasks and then feels like nothing has been accomplished.  Interventions: Cognitive Behavioral Therapy, Solution-Oriented/Positive Psychology and Ego-Supportive  Diagnosis:   ICD-10-CM   1. Generalized anxiety disorder  F41.1     Plan: Patient not signing tx plan on computer screen due to [place].  Treatment Goals: Goals may remain on tx plan as patient works to achieve her goals. Progress will be documented each session in the "Progress" section of Plan.  Long Term goal: Reduce overall level, frequency, and intensity of the anxiety so that daily functioning is not impaired.   Short Term goal: Verbalize an understanding of the role that fearful thinking plays in creating fears, excessive worry, and persistent anxiety symptoms . Strategy:  Explore cognitive messages that mediate anxiety response and retrain in adaptive cognitions.  Develop behavioral and cognitive strategies to reduce of eliminate the irrational anxiety. Help client develop healthy self-talk as a means of handling the anxiety.  (Progressing) Patient reports ongoing anxiety and it is very difficult for her.  Focused heavily today on her anxious thoughts and worked specifically on certain anxious thoughts that are tripping her up. Agreed together that she will be interrupted when she gets into anxiety spirals as she talks and there were 6 times in session that we noted her anxiety spiking and where she was definitely assuming  the worst in situations where she did really did not have anything to base those assumptions on. Each of these times we paused and let her "correct/change her thought process"  to be less anxiety-provoking and more  based in reality with a more hopeful tone to it. Given positive reinforcement for her efforts and accomplishment in this as it was not easy for patient to make the transition in her thoughts. Goal review and progress noted.  Next appt within 1-2 weeks.    Mathis Fare, LCSW

## 2019-09-17 ENCOUNTER — Ambulatory Visit (INDEPENDENT_AMBULATORY_CARE_PROVIDER_SITE_OTHER): Payer: 59 | Admitting: Psychiatry

## 2019-09-17 DIAGNOSIS — F411 Generalized anxiety disorder: Secondary | ICD-10-CM

## 2019-09-17 NOTE — Progress Notes (Signed)
[facility] Counselor/Therapist Progress Note  Patient ID: Kim Clements, MRN: [id number],    Date: 09/17/2019  Time Spent: 60 minutes 3:00pm to 4:00pm  Virtual Visit with Video Note Connected with patient by a video enabled telemedicine/telehealth application or telephone, with their informed consent, and verified patient privacy and that I am speaking with the correct person using two identifiers. I discussed the limitations, risks, security and privacy concerns of performing psychotherapy and management service by telephone and the availability of in person appointments. I also discussed with the patient that there may be a patient responsible charge related to this service. The patient expressed understanding and agreed to proceed. I discussed the treatment planning with the patient. The patient was provided an opportunity to ask questions and all were answered. The patient agreed with the plan and demonstrated an understanding of the instructions. The patient was advised to call  our office if  symptoms worsen or feel they are in a crisis state and need immediate contact.   Therapist Location: [facility] Patient Location: home  Treatment Type: Individual Therapy  Reported Symptoms: anxiety, frustration  Mental Status Exam:  Appearance:   Casual     Behavior:  Appropriate and Sharing  Motor:  Normal  Speech/Language:   Normal Rate  Affect:  anxious, depressed  Mood:  anxious and depressed  Thought process:  goal directed  Thought content:    WNL  Sensory/Perceptual disturbances:    WNL  Orientation:  oriented to person, place, time/date, situation, day of week, month of year and year  Attention:  Good  Concentration:  Good  Memory:  WNL  Fund of knowledge:   Good  Insight:    Good at time, Fair at times  Judgment:   Good at times, sometimes Fair and is influenced by others  Impulse Control:  Good   Risk Assessment: Danger to Self:   No Self-injurious Behavior: No Danger to Others: No Duty to Warn:no Physical Aggression / Violence:No  Access to Firearms a concern: No  Gang Involvement:No   Subjective: Patient today reporting anxiety, frustration, and concerns about some of the things people tell her.  Interventions: Cognitive Behavioral Therapy and Solution-Oriented/Positive Psychology  Diagnosis:   ICD-10-CM   1. Generalized anxiety disorder  F41.1     Plan: Patient not signing tx plan on computer screen due to [place].  Treatment Goals: Goals may remain on tx plan as patient works to achieve her goals. Progress will be documented each session in the "Progress" section of Plan.  Long Term goal: Reduce overall level, frequency, and intensity of the anxiety so that daily functioning is not impaired.   Short Term goal: Verbalize an understanding of the role that fearful thinking plays in creating fears, excessive worry, and persistent anxiety symptoms . Strategy:  Explore cognitive messages that mediate anxiety response and retrain in adaptive cognitions.  Develop behavioral and cognitive strategies to reduce of eliminate the irrational anxiety. Help client develop healthy self-talk as a means of handling the anxiety.  (Progressing) Patient continues work on her goals. Is trying to eat healthier and get in some exercise which she is not having as much success with.  Discussed how she might be more committed and she admits she has to make it a priority in order to be successful.  States her goal now is to walk 3 times a week. Stressed over situations other family are dealing with.  Also admitted to fb and friend contacts that are  very negative for her. We discussed this at length as her anxiety is obviously more increased today.  She shared some of the negativity that she has been involved in, pointing out some of her resulting anxious and negative thoughts have been recently.  Used those thoughts to work on  changing to more positive, reality-based, and empowering thoughts that do not support anxiety and fear.  Seemed to feel some more grounded by session end.  She is to get off fb for now and let friends know not be be sharing anxiety and fear-provoking information with her. She has confided in her brother some and he is a good support to her and is well-grounded.  Goal review and progress noted with patient.     Next appt within 1-2 weeks.   Mathis Fare, LCSW

## 2019-09-26 ENCOUNTER — Ambulatory Visit (INDEPENDENT_AMBULATORY_CARE_PROVIDER_SITE_OTHER): Payer: 59 | Admitting: Psychiatry

## 2019-09-26 ENCOUNTER — Telehealth: Payer: Self-pay

## 2019-09-26 DIAGNOSIS — F411 Generalized anxiety disorder: Secondary | ICD-10-CM | POA: Diagnosis not present

## 2019-09-26 MED ORDER — HYOSCYAMINE SULFATE 0.125 MG SL SUBL: 0.1250 mg | SUBLINGUAL_TABLET | Freq: Four times a day (QID) | SUBLINGUAL | 2 refills | Status: DC | PRN

## 2019-09-26 NOTE — Telephone Encounter (Signed)
Recd fax from [facility] stating that Chlordiazepoxide-Clidinium is / has been on backorder for a couple of months. Pharmacy is requesting alternative therapy. Please advise.

## 2019-09-26 NOTE — Progress Notes (Signed)
[facility] Counselor/Therapist Progress Note  Patient ID: Kim Clements, MRN: [id number],    Date: 09/26/2019  Time Spent: 60 minutes 3:00pm to 4:00pm  Virtual Visit via Webex Note Connected with patient by a video enabled telemedicine/telehealth application, with their informed consent, and verified patient privacy and that I am speaking with the correct person using two identifiers. I discussed the limitations, risks, security and privacy concerns of performing psychotherapy and management service by telephone and the availability of in person appointments. I also discussed with the patient that there may be a patient responsible charge related to this service. The patient expressed understanding and agreed to proceed. I discussed the treatment planning with the patient. The patient was provided an opportunity to ask questions and all were answered. The patient agreed with the plan and demonstrated an understanding of the instructions. The patient was advised to call  our office if  symptoms worsen or feel they are in a crisis state and need immediate contact.   Therapist Location: [facility] Patient Location: home  Treatment Type: Individual Therapy  Reported Symptoms: anxiety, worries a lot and hard to "stay in the present", some less motivation, frustration  Mental Status Exam:  Appearance:   Casual     Behavior:  Appropriate and Sharing  Motor:  Normal  Speech/Language:   Normal Rate  Affect:  anxious  Mood:  anxious  Thought process:  goal directed  Thought content:    WNL  Sensory/Perceptual disturbances:    WNL  Orientation:  oriented to person, place, time/date, situation, day of week, month of year and year  Attention:  Good  Concentration:  Good  Memory:  some forgetfulness  Fund of knowledge:   Good  Insight:    Good  Judgment:   Good  Impulse Control:  Usually good, but sometime fair   Risk Assessment: Danger to Self:   No Self-injurious Behavior: No Danger to Others: No Duty to Warn:no Physical Aggression / Violence:No  Access to Firearms a concern: No  Gang Involvement:No   Subjective: Patient today reports feeling anxious, frustrated, lower motivation, and hard to "stay in the present".  Interventions: Cognitive Behavioral Therapy and Solution-Oriented/Positive Psychology  Diagnosis:   ICD-10-CM   1. Generalized anxiety disorder  F41.1     Plan: Patient not signing tx plan on computer screen due to COVID.  Treatment Goals: Goals may remain on tx plan as patient works to achieve her goals. Progress will be documented each session in the "Progress" section of Plan.  Long Term goal: Reduce overall level, frequency, and intensity of the anxiety so that daily functioning is not impaired.   Short Term goal: Verbalize an understanding of the role that fearful thinking plays in creating fears, excessive worry, and persistent anxiety symptoms . Strategy:  Explore cognitive messages that mediate anxiety response and retrain in adaptive cognitions.  Develop behavioral and cognitive strategies to reduce of eliminate the irrational anxiety. Help client develop healthy self-talk as a means of handling the anxiety.  (Progressing) Patient continues progress on her goals as evidenced by her anxiety lowering some on a 1-10 scale, has come down from a "9/10" to a "7/8" today. Things that seem to be helping her be less anxious include: getting off Facebook, not watching as much national/world news, not talking as much about negative/fearful things with lots of different people, and trying to walk 3 times per week, staying more hydrated with water, and making healthier nutritional choices. Still not  on a good sleep schedule and have encouraged that again. Shared multiple negative/fearful/anxious thoughts that still bother her. Worked on these thoughts by replacing them with more positive, reality-based,  hopeful, empowering thoughts that do not support anxiety, depression, or fearfulness.   Also worked more specifically on the retention of the improved thoughts. To continue more next session.  Goal review and progress noted with patient.    Next appt within 2 weeks.  Shanon Ace, LCSW

## 2019-09-26 NOTE — Telephone Encounter (Signed)
See other note

## 2019-09-26 NOTE — Telephone Encounter (Signed)
Per Amy Esterwood-PA the alterative therapy should be Levsin 1 po Q6hrs prn abd pain/ spasms. - [facility].  Pt informed as well.

## 2019-10-02 ENCOUNTER — Ambulatory Visit (INDEPENDENT_AMBULATORY_CARE_PROVIDER_SITE_OTHER): Payer: 59 | Admitting: Psychiatry

## 2019-10-02 DIAGNOSIS — F411 Generalized anxiety disorder: Secondary | ICD-10-CM | POA: Diagnosis not present

## 2019-10-02 NOTE — Progress Notes (Signed)
[facility] Counselor/Therapist Progress Note  Patient ID: Kim Clements, MRN: [id number],    Date: 10/02/2019  Time Spent: 60 minutes  3:00pm to 4:00pm  Virtual Visit Note Connected with patient by a video enabled telemedicine/telehealth application or telephone, with their informed consent, and verified patient privacy and that I am speaking with the correct person using two identifiers. I discussed the limitations, risks, security and privacy concerns of performing psychotherapy and management service by telephone and the availability of in person appointments. I also discussed with the patient that there may be a patient responsible charge related to this service. The patient expressed understanding and agreed to proceed. I discussed the treatment planning with the patient. The patient was provided an opportunity to ask questions and all were answered. The patient agreed with the plan and demonstrated an understanding of the instructions. The patient was advised to call  our office if  symptoms worsen or feel they are in a crisis state and need immediate contact.   Therapist Location: [facility] Patient Location: home   Treatment Type: Individual Therapy  Reported Symptoms: anxiety, motivation issue, frustration, fears  Mental Status Exam:  Appearance:   n/a   telehealth     Behavior:  Sharing  Motor:  n/a  telehealth  Speech/Language:   Normal Rate  Affect:  n/a  telehealth  Mood:  anxious, fearful  Thought process:  goal directed  Thought content:    WNL  Sensory/Perceptual disturbances:    WNL  Orientation:  oriented to person, place, time/date, situation, day of week, month of year and year  Attention:  Good  Concentration:  Good  Memory:  occasional forgetfulness   Fund of knowledge:   Good  Insight:    Good  Judgment:   Good  Impulse Control:  Good   Risk Assessment: Danger to Self:  No Self-injurious Behavior: No Danger to Others:  No Duty to Warn:no Physical Aggression / Violence:No  Access to Firearms a concern: No  Gang Involvement:No   Subjective: Patient reports past week has been more stressful.  Increased anxiety and fear.     Interventions: Cognitive Behavioral Therapy and Solution-Oriented/Positive Psychology  Diagnosis:   ICD-10-CM   1. Generalized anxiety disorder  F41.1     Plan: Patient not signing tx plan on computer screen due to COVID.  Treatment Goals: Goals may remain on tx plan as patient works to achieve her goals. Progress will be documented each session in the "Progress" section of Plan.  Long Term goal: Reduce overall level, frequency, and intensity of the anxiety so that daily functioning is not impaired.   Short Term goal: Verbalize an understanding of the role that fearful thinking plays in creating fears, excessive worry, and persistent anxiety symptoms . Strategy:  Explore cognitive messages that mediate anxiety response and retrain in adaptive cognitions.  Develop behavioral and cognitive strategies to reduce of eliminate the irrational anxiety. Help client develop healthy self-talk as a means of handling the anxiety.  (Progressing) Patient today reports heightened anxiety and fear due to critical illness of close friend and recent hospitalization of mother-in-law (and Covid concerns). On 1-10 scale of anxiety, she was rated at onset of session as "a 9 or 10".  Talked through her concerns and was able to disable some of her automatic thoughts that feed her anxiety and worry. Worked hard on confronting this today as well as th power of automatic thoughts.  Used some examples to do some thought changing  while in session, changing anxious thoughts into more positive, calming, reality-based thoughts that do not support anxiety nor panic.  At session end, she self-rated as a "7" on the "1-10 anxiety scale." Still off facebook and that has helped. Encouraged exercise, healthy  nutrition, better sleep schedule, and practice of the thought changing exercises on her own between session. Goal review and progress/challenges noted with patient.  Next appt within 2 weeks.   Shanon Ace, LCSW

## 2019-10-11 ENCOUNTER — Ambulatory Visit: Payer: 59 | Admitting: Psychiatry

## 2019-10-11 ENCOUNTER — Ambulatory Visit (INDEPENDENT_AMBULATORY_CARE_PROVIDER_SITE_OTHER): Payer: 59 | Admitting: Psychiatry

## 2019-10-11 DIAGNOSIS — F411 Generalized anxiety disorder: Secondary | ICD-10-CM

## 2019-10-11 NOTE — Progress Notes (Signed)
[facility] Counselor/Therapist Progress Note  Patient ID: Kim Clements, MRN: [id number],    Date: 10/11/2019  Time Spent: 60 minutes  1:00pm to 2:00pm  Virtual Visit Note Connected with patient by a video enabled telemedicine/telehealth application or telephone, with their informed consent, and verified patient privacy and that I am speaking with the correct person using two identifiers. I discussed the limitations, risks, security and privacy concerns of performing psychotherapy and management service by telephone and the availability of in person appointments. I also discussed with the patient that there may be a patient responsible charge related to this service. The patient expressed understanding and agreed to proceed. I discussed the treatment planning with the patient. The patient was provided an opportunity to ask questions and all were answered. The patient agreed with the plan and demonstrated an understanding of the instructions. The patient was advised to call  our office if  symptoms worsen or feel they are in a crisis state and need immediate contact.   Therapist Location: [facility] Patient Location: home   Treatment Type: Individual Therapy  Reported Symptoms: anxiety, frustrations, fears (improved some), forgetfulness under stress  Mental Status Exam:  Appearance:   n/a  telehealth     Behavior:  Appropriate and Sharing  Motor:  n/a  telehealth  Speech/Language:   Clear and Coherent  Affect:  n/a  telehealth  Mood:  anxious and depressed (depression mild)  Thought process:  goal directed  Thought content:    WNL  Sensory/Perceptual disturbances:    WNL  Orientation:  oriented to person, place, time/date, situation, day of week, month of year and year  Attention:  Good  Concentration:  Good  Memory:  some forgetfulness under stress; PCP is aware per patient  Fund of knowledge:   Good  Insight:    Good  Judgment:   Good  Impulse  Control:  Good   Risk Assessment: Danger to Self:  No Self-injurious Behavior: No Danger to Others: No Duty to Warn:no Physical Aggression / Violence:No  Access to Firearms a concern: No  Gang Involvement:No   Subjective:  Patient reports ups and downs with symptoms most recently since last appt. Has been working on getting to bed earlier    Interventions: Cognitive Behavioral Therapy and Solution-Oriented/Positive Psychology  Diagnosis:   ICD-10-CM   1. Generalized anxiety disorder  F41.1      Plan: Patient not signing tx plan on computer screen due to COVID.  Treatment Goals: Goals may remain on tx plan as patient works to achieve her goals. Progress will be documented each session in the "Progress" section of Plan.  Long Term goal: Reduce overall level, frequency, and intensity of the anxiety so that daily functioning is not impaired.   Short Term goal: Verbalize an understanding of the role that fearful thinking plays in creating fears, excessive worry, and persistent anxiety symptoms . Strategy:  Explore cognitive messages that mediate anxiety response and retrain in adaptive cognitions.  Develop behavioral and cognitive strategies to reduce of eliminate the irrational anxiety. Help client develop healthy self-talk as a means of handling the anxiety.  (Progressing) Patient remains goal-focused and is having some motivation issues, along with her struggles with the pandemic, social distancing, and dealing with uncertainties. Anxious/negative thoughts persist so we focused on thought interception and replacment strategies, with patient being able to perform these strategies in session, however, it is much more difficult for her to do "in the moment".  Also worked on  some "grounding" strategies that can help her respond better in stressful times/situations.  Has close friend with Covid that is still a stressor for her although that friend is making progress.  Patient is  trying to be more successful in not "holding on to worries/anxious thoughts" more in the moment and has made some progress with that, as long as she uses prompts/reminders as discussed in sessions.  On a 1-10 scale of anxiety, she rates herself at "about a 7/8" which is improved from last session's starting scale score and is also good considering additional stressors occurring this past week with her husband's mother's health situation. Towards end of session patient mentioned her motivation sagging some and I gave her a strategy to try, referred to as "immediate reaction".  Will follow up on this next session.  Goal review and progress noted with patient.  Next appt within 2 weeks.   Shanon Ace, LCSW

## 2019-10-12 ENCOUNTER — Telehealth: Payer: Self-pay | Admitting: Gastroenterology

## 2019-10-15 NOTE — Telephone Encounter (Signed)
Due to a manufacturer back order, the patient had to have a replacement for Librax. We used Levsin. She has questions about Levsin. Explained the PRN use of Levsin and benefit of placing the tablet under her tongue.  States she has been "doing fairly well." She will try the Levsin and call back with any further questions or concerns.

## 2019-10-15 NOTE — Telephone Encounter (Signed)
No answer. Left a message to call back.

## 2019-10-17 ENCOUNTER — Ambulatory Visit (INDEPENDENT_AMBULATORY_CARE_PROVIDER_SITE_OTHER): Payer: 59 | Admitting: Psychiatry

## 2019-10-17 DIAGNOSIS — F411 Generalized anxiety disorder: Secondary | ICD-10-CM | POA: Diagnosis not present

## 2019-10-17 NOTE — Progress Notes (Signed)
[facility] Counselor/Therapist Progress Note  Patient ID: Annalaura Sauseda, MRN: [id number],    Date: 10/17/2019  Time Spent: 60 minutes  1:00pm to 2:00pm  Virtual Visit Note Connected with patient by a video enabled telemedicine/telehealth application or telephone, with their informed consent, and verified patient privacy and that I am speaking with the correct person using two identifiers. I discussed the limitations, risks, security and privacy concerns of performing psychotherapy and management service by telephone and the availability of in person appointments. I also discussed with the patient that there may be a patient responsible charge related to this service. The patient expressed understanding and agreed to proceed. I discussed the treatment planning with the patient. The patient was provided an opportunity to ask questions and all were answered. The patient agreed with the plan and demonstrated an understanding of the instructions. The patient was advised to call  our office if  symptoms worsen or feel they are in a crisis state and need immediate contact.   Therapist Location: [facility] Patient Location: home   Treatment Type: Individual Therapy  Reported Symptoms: anxiety, some depression, low motivation (some better)  Mental Status Exam:  Appearance:    n/a telehealth  Behavior:  Appropriate and Sharing  Motor:  n/a  telehealth  Speech/Language:   Normal Rate  Affect:  n/a  telehealth  Mood:  anxious and depressed  Thought process:  normal  Thought content:    WNL  Sensory/Perceptual disturbances:    WNL  Orientation:  oriented to person, place, time/date, situation, day of week, month of year and year  Attention:  Good  Concentration:  Good  Memory:  "some forgetfulness" Dr is aware  Fund of knowledge:   Good  Insight:    Good  Judgment:   Good  Impulse Control:  Good   Risk Assessment: Danger to Self:  No Self-injurious Behavior:  No Danger to Others: No Duty to Warn:no Physical Aggression / Violence:No  Access to Firearms a concern: No  Gang Involvement:No   Subjective:  Patient today reports continued anxiety but better follow up on strategies to work on her anxiety.  Still has some depression and it has decreased.  Her low motivation has improved some.  Interventions: Cognitive Behavioral Therapy and Solution-Oriented/Positive Psychology  Diagnosis:   ICD-10-CM   1. Generalized anxiety disorder  F41.1      Plan: Patient not signing tx plan on computer screen due to COVID.  Treatment Goals: Goals may remain on tx plan as patient works to achieve her goals. Progress will be documented each session in the "Progress" section of Plan.  Long Term goal: Reduce overall level, frequency, and intensity of the anxiety so that daily functioning is not impaired.   Short Term goal: Verbalize an understanding of the role that fearful thinking plays in creating fears, excessive worry, and persistent anxiety symptoms . Strategy:  Explore cognitive messages that mediate anxiety response and retrain in adaptive cognitions.  Develop behavioral and cognitive strategies to reduce of eliminate the irrational anxiety. Help client develop healthy self-talk as a means of handling the anxiety.  (Progressing) Patient reports she did her homework assignment from last session re: not putting off tasks (especially those that aggravate her anxiety) and also improving her motivation. She actually did this twice and although she wasn't able to complete the tasks, she definitely did follow through on assignment and has plans for completion this week. Patient very frustrated and angry with situation they are  involved in with mother-in-law. (Not all info included in patient's chart due to privacy needs.)  Processed this at length in session today.  Feelings of anger, resentment, frustration, and impatience all shared as she spoke freely  about her concerns. Just venting her concerns helped her feel more grounded, but also worked on some strategies for her anger and resentment including deep breathing exercises, physical exercise, meditation, stepping away and taking a break, talking feelings out Calmly, and intentionally doing something pleasurable.  Related CBT strategies re: working on out thoughts to influence our feelings and behaviors.  Will follow up on this next session.  Goal review and progress/challenges noted with patient.            Next appt within 2 weeks.   Shanon Ace, LCSW

## 2019-10-19 ENCOUNTER — Ambulatory Visit: Payer: 59 | Admitting: Psychiatry

## 2019-10-24 ENCOUNTER — Ambulatory Visit (INDEPENDENT_AMBULATORY_CARE_PROVIDER_SITE_OTHER): Payer: 59 | Admitting: Psychiatry

## 2019-10-24 DIAGNOSIS — F411 Generalized anxiety disorder: Secondary | ICD-10-CM

## 2019-10-24 NOTE — Progress Notes (Signed)
[facility] Counselor/Therapist Progress Note  Patient ID: Kim Clements, MRN: [id number],    Date: 10/24/2019  Time Spent: 60 minutes  1:00pm to 2:00pm   Virtual Visit Note Connected with patient by a video enabled telemedicine/telehealth application or telephone, with their informed consent, and verified patient privacy and that I am speaking with the correct person using two identifiers. I discussed the limitations, risks, security and privacy concerns of performing psychotherapy and management service by telephone and the availability of in person appointments. I also discussed with the patient that there may be a patient responsible charge related to this service. The patient expressed understanding and agreed to proceed. I discussed the treatment planning with the patient. The patient was provided an opportunity to ask questions and all were answered. The patient agreed with the plan and demonstrated an understanding of the instructions. The patient was advised to call  our office if  symptoms worsen or feel they are in a crisis state and need immediate contact.   Therapist Location: [facility] Patient Location: home   Treatment Type: Individual Therapy  Reported Symptoms: anxiety, worry, depression, self-defeating behaviors,   Mental Status Exam:  Appearance:   n/a   telehealth     Behavior:  Sharing  Motor:  n/a   telehealth  Speech/Language:   Normal Rate  Affect:  n/a   telehealth  Mood:  anxious and depressed  Thought process:  normal  Thought content:    WNL  Sensory/Perceptual disturbances:    WNL  Orientation:  oriented to person, place, time/date, situation, day of week, month of year and year  Attention:  Good  Concentration:  Good  Memory:  some forgetfulness "under stress"  Fund of knowledge:   Good  Insight:    Good  Judgment:   Good  Impulse Control:  Good / Fair   Risk Assessment: Danger to Self:  No Self-injurious Behavior:  No Danger to Others: No Duty to Warn:no Physical Aggression / Violence:No  Access to Firearms a concern: No  Gang Involvement:No   Subjective:  Patient today is experiencing heightened anxiety and some depression.  Issues within family and she has found it difficult in making some much needed behavioral changes discussed in sessions.  Interventions: Cognitive Behavioral Therapy and Solution-Oriented/Positive Psychology  Diagnosis:   ICD-10-CM   1. Generalized anxiety disorder  F41.1     Plan: Patient not signing tx plan on computer screen due to COVID.  Treatment Goals: Goals may remain on tx plan as patient works to achieve her goals. Progress will be documented each session in the "Progress" section of Plan.  Long Term goal: Reduce overall level, frequency, and intensity of the anxiety so that daily functioning is not impaired.   Short Term goal: Verbalize an understanding of the role that fearful thinking plays in creating fears, excessive worry, and persistent anxiety symptoms . Strategy:  Explore cognitive messages that mediate anxiety response and retrain in adaptive cognitions.  Develop behavioral and cognitive strategies to reduce of eliminate the irrational anxiety. Help client develop healthy self-talk as a means of handling the anxiety.  (Progressing) Patient is more anxious this session and also depressed ("but some better"). In following up on homework it's obvious patient is having difficulty making some changes.  She takes lots of notes but the follow through is difficult.  Today worked with her on developing behavioral and cognitive strategies to reduce the excessive anxiety, and which will also likely reduce depression more  also. Helped her change some of her self-talk (negative, and also not making excuses to postpone taking action) to be more positive, self-affirming self-talk and to also encourage herself in taking action steps rather than excusing lack of  action on things that can improve her mental/emotional/physical health. Focused on limiting her involvement on FB as that tends to feed her anxiety, and also to limit her time spent on her phone keeping up on websites etc that lead to increased anxiety.  Encouraged patient to work on developing more of a regular sleep pattern versus her current pattern of going to bed somewhere between 1:30am and 3:00am. Talked about a step-by-step approach to this, by moving bedtime up by 30 minutes several nights rather than trying to do a bigger chunk of time in one night and she agreed to try this.  Supported her in the fact these will not be easy changes for her, but can also help reduce her symptoms and offer better self-care. Goal review and progress noted.   Next appt within 2 weeks.   Kim Ace, LCSW

## 2019-10-30 ENCOUNTER — Ambulatory Visit: Payer: 59 | Admitting: Psychiatry

## 2019-11-01 ENCOUNTER — Ambulatory Visit (INDEPENDENT_AMBULATORY_CARE_PROVIDER_SITE_OTHER): Payer: 59 | Admitting: Psychiatry

## 2019-11-01 DIAGNOSIS — F411 Generalized anxiety disorder: Secondary | ICD-10-CM | POA: Diagnosis not present

## 2019-11-01 NOTE — Progress Notes (Signed)
[facility] Counselor/Therapist Progress Note  Patient ID: Kim Clements, MRN: [id number],    Date: 11/01/2019  Time Spent: 60 minutes  1:00pm to 2:00pm  Virtual Visit Note Connected with patient by a video enabled telemedicine/telehealth application or telephone, with their informed consent, and verified patient privacy and that I am speaking with the correct person using two identifiers. I discussed the limitations, risks, security and privacy concerns of performing psychotherapy and management service by telephone and the availability of in person appointments. I also discussed with the patient that there may be a patient responsible charge related to this service. The patient expressed understanding and agreed to proceed. I discussed the treatment planning with the patient. The patient was provided an opportunity to ask questions and all were answered. The patient agreed with the plan and demonstrated an understanding of the instructions. The patient was advised to call  our office if  symptoms worsen or feel they are in a crisis state and need immediate contact.   Therapist Location: [facility] Patient Location: home   Treatment Type: Individual Therapy  Reported Symptoms: anxiety, worry, depression, family concerns  Mental Status Exam:  Appearance:   n/a  telehealth     Behavior:  Appropriate and Sharing  Motor:  n/a  telehealth  Speech/Language:   Normal Rate  Affect:  n/a  telehealth  Mood:  anxious and depressed  Thought process:  goal directed  Thought content:    WNL  Sensory/Perceptual disturbances:    WNL  Orientation:  oriented to person, place, time/date, situation, day of week, month of year and year  Attention:  Good  Concentration:  Good / Fair  Memory:  some forgetfulness and PCP is aware  Fund of knowledge:   Good  Insight:    Good  Judgment:   Good  Impulse Control:  Good / Fair   Risk Assessment: Danger to Self:   No Self-injurious Behavior: No Danger to Others: No Duty to Warn:no Physical Aggression / Violence:No  Access to Firearms a concern: No  Gang Involvement:No   Subjective: Patient today experiencing anxiety, some depression, and worry. Ongoing concern re: pandemic, husband's mother, and some family concerns. Past week has been some better and "i've been trying to be more productive and get into a better sleep and getting up routine."   Interventions: Cognitive Behavioral Therapy and Solution-Oriented/Positive Psychology  Diagnosis:   ICD-10-CM   1. Generalized anxiety disorder  F41.1      Plan: Patient not signing tx plan on computer screen due to [place].  Treatment Goals: Goals may remain on tx plan as patient works to achieve her goals. Progress will be documented each session in the "Progress" section of Plan.  Long Term goal: Reduce overall level, frequency, and intensity of the anxiety so that daily functioning is not impaired.   Short Term goal: Verbalize an understanding of the role that fearful thinking plays in creating fears, excessive worry, and persistent anxiety symptoms . Strategy:  Explore cognitive messages that mediate anxiety response and retrain in adaptive cognitions.  Develop behavioral and cognitive strategies to reduce of eliminate the irrational anxiety. Help client develop healthy self-talk as a means of handling the anxiety.  (Progressing) Patient today reporting anxiety, stress, depression, and some family concerns. Difficulty stopping self-defeating behaviors which we focused on heavily today. Per her long and short term goals, we worked on her better understanding the role her anxious/fearful thoughts play especially in her excessive worry and persistent  anxiety symptoms.  Have discussed in sessions steps that needed to be taken to decrease her exposure to anxiety-provoking media and it has been very difficult for patient to stop the self-defeating  habits of consuming a lot of social medial and online contacts that are heavily filled with disturbing and fear-inducing news. It does seem that on 1 occasion this past week she stopped midway through a time when she had been consuming a lot of unhelpful and fear inducing information.  Patient tends to believe that watching such information "helps me be prepared in case something awful happens."  Worked on this misinformation and the worry that it creates does nothing to help her be prepared, and if anything, it wears her down and she feels more tired, irritable, and "anxiously forgets things more." Also encouraged better sleep habits of going to be by and getting up (for her) by 9:00am.  Goal review and progress/obstacles noted with patient.   Next appt within 2 weeks.   Mathis Fare, LCSW

## 2019-11-07 ENCOUNTER — Other Ambulatory Visit: Payer: Self-pay | Admitting: Physician Assistant

## 2019-11-08 ENCOUNTER — Ambulatory Visit (INDEPENDENT_AMBULATORY_CARE_PROVIDER_SITE_OTHER): Payer: 59 | Admitting: Psychiatry

## 2019-11-08 ENCOUNTER — Other Ambulatory Visit: Payer: Self-pay | Admitting: Physician Assistant

## 2019-11-08 DIAGNOSIS — F411 Generalized anxiety disorder: Secondary | ICD-10-CM

## 2019-11-08 NOTE — Progress Notes (Signed)
[facility] Counselor/Therapist Progress Note  Patient ID: Kim Clements, MRN: [id number],    Date: 11/08/2019  Time Spent: 60 minutes 1:00pm to 2:00pm  Virtual Visit Note Connected with patient by a video enabled telemedicine/telehealth application or telephone, with their informed consent, and verified patient privacy and that I am speaking with the correct person using two identifiers. I discussed the limitations, risks, security and privacy concerns of performing psychotherapy and management service by telephone and the availability of in person appointments. I also discussed with the patient that there may be a patient responsible charge related to this service. The patient expressed understanding and agreed to proceed. I discussed the treatment planning with the patient. The patient was provided an opportunity to ask questions and all were answered. The patient agreed with the plan and demonstrated an understanding of the instructions. The patient was advised to call  our office if  symptoms worsen or feel they are in a crisis state and need immediate contact.   Therapist Location: [facility] Patient Location: home  Treatment Type: Individual Therapy  Reported Symptoms:  Anxiety, depression (decreased)  Mental Status Exam:  Appearance:   n/a  telehealth     Behavior:  Appropriate and Sharing  Motor:  N/a  telehealth  Speech/Language:   Normal Rate  Affect:  n/a  telehealth  Mood:  anxious  Thought process:  goal directed  Thought content:    WNL  Sensory/Perceptual disturbances:    WNL  Orientation:  oriented to person, place, time/date, situation, day of week, month of year and year  Attention:  Good  Concentration:  Good  Memory:  some forgetfullness  Fund of knowledge:   Good  Insight:    Good  Judgment:   Good  Impulse Control:  Good   Risk Assessment: Danger to Self:  No Self-injurious Behavior: No Danger to Others: No Duty to  Warn:no Physical Aggression / Violence:No  Access to Firearms a concern: No  Gang Involvement:No   Subjective: Anxious, depression but decreased.  Working to reduce her anxiety overall.  This is difficult for her and sticking with difficult tasks in order to achieve change is challenging for her, but is making efforts.  Interventions: Cognitive Behavioral Therapy and Solution-Oriented/Positive Psychology  Diagnosis:   ICD-10-CM   1. Generalized anxiety disorder  F41.1      Plan: Patient not signing tx plan on computer screen due to [place].  Treatment Goals: Goals may remain on tx plan as patient works to achieve her goals. Progress will be documented each session in the "Progress" section of Plan.  Long Term goal: Reduce overall level, frequency, and intensity of the anxiety so that daily functioning is not impaired.   Short Term goal: Verbalize an understanding of the role that fearful thinking plays in creating fears, excessive worry, and persistent anxiety symptoms . Strategy:  Explore cognitive messages that mediate anxiety response and retrain in adaptive cognitions.  Develop behavioral and cognitive strategies to reduce of eliminate the irrational anxiety. Help client develop healthy self-talk as a means of handling the anxiety.  (Progressing) Patient reports some decrease in anxiety this past week, and is trying to confront her anxious thoughts more.  Some difficulty with catching her anxious thoughts more quickly. Also hard for her to pull away from "bad news" online or TV, as that feeds her anxieties, which she admits can be almost addictive at times. Discussed specific behavioral strategies and self-talk for her to use in "pulling  herself" away from things that work against her and her goals, and practiced these in session. Discussed how she feels when she is exposed to things that she has trouble pulling away from even though she knows it is not healthy for her, and  the difficulty she has pulling herself away.  Does report that she is still trying to walk some and is having to work at being motivated although does want to lose weight and be healthier. Some of her prior family concerns have diminished some and that is helping. Continues work on self defeating behaviors with some success. Supporting her in her efforts and in continued perseverance of better managing anxiety and being increasingly proactive in self-care (emotional and physical.)  Goal review and progress/challenges noted with patient.  Next appt within 2 weeks.   Mathis Fare, LCSW

## 2019-11-14 ENCOUNTER — Ambulatory Visit: Payer: 59 | Admitting: Psychiatry

## 2019-11-21 ENCOUNTER — Ambulatory Visit (INDEPENDENT_AMBULATORY_CARE_PROVIDER_SITE_OTHER): Payer: 59 | Admitting: Psychiatry

## 2019-11-21 ENCOUNTER — Other Ambulatory Visit: Payer: Self-pay

## 2019-11-21 DIAGNOSIS — F411 Generalized anxiety disorder: Secondary | ICD-10-CM | POA: Diagnosis not present

## 2019-11-21 NOTE — Progress Notes (Signed)
      [facility] Counselor/Therapist Progress Note  Patient ID: Kim Clements, MRN: [id number],    Date: 11/21/2019  Time Spent: 60 minutes  1:00pm to 2:00pm   Treatment Type: Individual Therapy  Reported Symptoms:  Anxiety, some residual depression "but mostly anxiety"  Mental Status Exam:  Appearance:   Casual     Behavior:  Appropriate and Sharing  Motor:  Normal  Speech/Language:   Normal Rate  Affect:  anxious, some depression  Mood:  anxious and depressed  Thought process:  goal directed  Thought content:    WNL  Sensory/Perceptual disturbances:    WNL  Orientation:  oriented to person, place, time/date, situation, day of week, month of year and year  Attention:  Good  Concentration:  Good  Memory:  WNL  Fund of knowledge:   Good  Insight:    Good  Judgment:   Good  Impulse Control:  Good   Risk Assessment: Danger to Self:  No Self-injurious Behavior: No Danger to Others: No Duty to Warn:no Physical Aggression / Violence:No  Access to Firearms a concern: No  Gang Involvement:No   Subjective: Patient reports the past 2 weeks "have been very hard" and have felt more stressed and frustrated, and anxiety was heightened.  Feels she did not manage things well.  Missed being able to go away on short beach trip.   Interventions: Cognitive Behavioral Therapy and Solution-Oriented/Positive Psychology  Diagnosis:   ICD-10-CM   1. Generalized anxiety disorder  F41.1      Plan: Patient not signing tx plan on computer screen due to COVID.  Treatment Goals: Goals may remain on tx plan as patient works to achieve her goals. Progress will be documented each session in the "Progress" section of Plan.  Long Term goal: Reduce overall level, frequency, and intensity of the anxiety so that daily functioning is not impaired.   Short Term goal: Verbalize an understanding of the role that fearful thinking plays in creating fears, excessive worry, and  persistent anxiety symptoms . Strategy:  Explore cognitive messages that mediate anxiety response and retrain in adaptive cognitions.  Develop behavioral and cognitive strategies to reduce of eliminate the irrational anxiety. Help client develop healthy self-talk as a means of handling the anxiety.  (Progressing) Patient shared that the past couple weeks have been difficult for her due to issues with family member's illness, communication and frustrations within extended family.  Frustrated and mad with herself that she's reacting and feeling the way she is. Processed these feelings more with a focus on better self-acceptance and self-forgiveness especially when she feels she's taking out her stress on others. Still stressed with pandemic even though she does see that some things seem to be getting better.  Has had more "suspected IBS"recently and did not go on a planned trip which she really missed going.  Anxiety has been much "higher" recently about family concerns that create difficulties in relationships.  Did work more today on her anxious thoughts, trying to replace them with more positive, reality-based, and empowering thoughts that do not support anxiety.  Also discussed some helpful points in communicating better with husband in sensitive and high-stress times that she can use more between sessions. Goal review and progress noted with patient.  Next appt within 1-2 weeks.  Mathis Fare, LCSW

## 2019-11-26 ENCOUNTER — Encounter: Payer: 59 | Admitting: Family Medicine

## 2019-11-28 ENCOUNTER — Ambulatory Visit (INDEPENDENT_AMBULATORY_CARE_PROVIDER_SITE_OTHER): Payer: 59 | Admitting: Psychiatry

## 2019-11-28 ENCOUNTER — Other Ambulatory Visit: Payer: Self-pay

## 2019-11-28 DIAGNOSIS — F411 Generalized anxiety disorder: Secondary | ICD-10-CM | POA: Diagnosis not present

## 2019-11-28 NOTE — Progress Notes (Signed)
      [facility] Counselor/Therapist Progress Note  Patient ID: Sosie Gato, MRN: [id number],    Date: 11/28/2019  Time Spent: 60 minutes    4:00pm to 5:00pm  Treatment Type: Individual Therapy  Reported Symptoms: anxiety, stress, frustration   Mental Status Exam:  Appearance:   Casual     Behavior:  Appropriate and Sharing  Motor:  Normal  Speech/Language:   Normal  Affect:  anxious  Mood:  anxious  Thought process:  normal  Thought content:    WNL  Sensory/Perceptual disturbances:    WNL  Orientation:  oriented to person, place, time/date, situation, day of week, month of year and year  Attention:  Good/Fair  Concentration:  Goof/Fair  Memory:  some forgetfulness  Fund of knowledge:   Good  Insight:    Good  Judgment:   Good  Impulse Control:  Good   Risk Assessment: Danger to Self:  No Self-injurious Behavior: No Danger to Others: No Duty to Warn:no Physical Aggression / Violence:No  Access to Firearms a concern: No  Gang Involvement:No   Subjective: Patient reports stress and anxiety over family medical issues and more generalized.  Able to manage some stress a little better.  Still working to limit social media and negative/ stress inducing news on TV or online.   Interventions: Cognitive Behavioral Therapy and Solution-Oriented/Positive Psychology  Diagnosis:   ICD-10-CM   1. Generalized anxiety disorder  F41.1     Plan: Patient not signing tx plan on computer screen due to COVID.  Treatment Goals: Goals may remain on tx plan as patient works to achieve her goals. Progress will be documented each session in the "Progress" section of Plan.  Long Term goal: Reduce overall level, frequency, and intensity of the anxiety so that daily functioning is not impaired.   Short Term goal: Verbalize an understanding of the role that fearful thinking plays in creating fears, excessive worry, and persistent anxiety symptoms . Strategy:   Explore cognitive messages that mediate anxiety response and retrain in adaptive cognitions.  Develop behavioral and cognitive strategies to reduce of eliminate the irrational anxiety. Help client develop healthy self-talk as a means of handling the anxiety.  (Progressing) Patient in today feels she has progressed  in some ways, especially not quite as over-all anxious as she has been for a long time.  Progress has been very gradual and she feels talking through her anxieties helps, staying off Facebook more has helped, and not checking  the national and local news headlines as much on TV has helped, in addition to getting out more and doing some deep breathing exercises helps. Encouraged her to use the deep breathing more regularly rather than "at the last minute when feeling increased anxiety."  Also to pay attention to her speech as when she is really anxious her speech is more run together, and to slow it down to stop at end of each sentence. She reports not always noticing that this worsens with anxiety.  To continue focus on  intercepting her more anxious thoughts, then replacing them with more positive, reality-based, hopeful, and empowering thought patterns that do not support anxiety.                      Goal review and some progress noted with patient.  Next session within 2 weeks.   Mathis Fare, LCSW

## 2019-11-30 ENCOUNTER — Other Ambulatory Visit: Payer: Self-pay

## 2019-11-30 ENCOUNTER — Encounter: Payer: Self-pay | Admitting: Family Medicine

## 2019-11-30 ENCOUNTER — Ambulatory Visit (INDEPENDENT_AMBULATORY_CARE_PROVIDER_SITE_OTHER): Payer: 59 | Admitting: Family Medicine

## 2019-11-30 VITALS — BP 110/82 | HR 85 | Temp 98.1°F | Ht 61.5 in | Wt 167.6 lb

## 2019-11-30 DIAGNOSIS — E782 Mixed hyperlipidemia: Secondary | ICD-10-CM

## 2019-11-30 DIAGNOSIS — Z0000 Encounter for general adult medical examination without abnormal findings: Secondary | ICD-10-CM | POA: Diagnosis not present

## 2019-11-30 DIAGNOSIS — F411 Generalized anxiety disorder: Secondary | ICD-10-CM | POA: Diagnosis not present

## 2019-11-30 DIAGNOSIS — F339 Major depressive disorder, recurrent, unspecified: Secondary | ICD-10-CM

## 2019-11-30 LAB — CBC WITH DIFFERENTIAL/PLATELET
Basophils Absolute: 0 K/uL (ref 0.0–0.1)
Basophils Relative: 0.6 % (ref 0.0–3.0)
Eosinophils Absolute: 0.1 K/uL (ref 0.0–0.7)
Eosinophils Relative: 1.7 % (ref 0.0–5.0)
HCT: 41.6 % (ref 36.0–46.0)
Hemoglobin: 14 g/dL (ref 12.0–15.0)
Lymphocytes Relative: 25.1 % (ref 12.0–46.0)
Lymphs Abs: 1.7 K/uL (ref 0.7–4.0)
MCHC: 33.8 g/dL (ref 30.0–36.0)
MCV: 89.1 fl (ref 78.0–100.0)
Monocytes Absolute: 0.4 K/uL (ref 0.1–1.0)
Monocytes Relative: 6.7 % (ref 3.0–12.0)
Neutro Abs: 4.3 K/uL (ref 1.4–7.7)
Neutrophils Relative %: 65.9 % (ref 43.0–77.0)
Platelets: 253 K/uL (ref 150.0–400.0)
RBC: 4.67 Mil/uL (ref 3.87–5.11)
RDW: 13.3 % (ref 11.5–15.5)
WBC: 6.6 K/uL (ref 4.0–10.5)

## 2019-11-30 LAB — VITAMIN D 25 HYDROXY (VIT D DEFICIENCY, FRACTURES): VITD: 38.98 ng/mL (ref 30.00–100.00)

## 2019-11-30 LAB — LIPID PANEL
Cholesterol: 231 mg/dL — ABNORMAL HIGH (ref 0–200)
HDL: 52.1 mg/dL
LDL Cholesterol: 155 mg/dL — ABNORMAL HIGH (ref 0–99)
NonHDL: 179.13
Total CHOL/HDL Ratio: 4
Triglycerides: 122 mg/dL (ref 0.0–149.0)
VLDL: 24.4 mg/dL (ref 0.0–40.0)

## 2019-11-30 LAB — COMPREHENSIVE METABOLIC PANEL WITH GFR
ALT: 29 U/L (ref 0–35)
AST: 17 U/L (ref 0–37)
Albumin: 4.3 g/dL (ref 3.5–5.2)
Alkaline Phosphatase: 68 U/L (ref 39–117)
BUN: 16 mg/dL (ref 6–23)
CO2: 28 meq/L (ref 19–32)
Calcium: 9.4 mg/dL (ref 8.4–10.5)
Chloride: 104 meq/L (ref 96–112)
Creatinine, Ser: 0.87 mg/dL (ref 0.40–1.20)
GFR: 67.79 mL/min
Glucose, Bld: 95 mg/dL (ref 70–99)
Potassium: 4.4 meq/L (ref 3.5–5.1)
Sodium: 138 meq/L (ref 135–145)
Total Bilirubin: 0.6 mg/dL (ref 0.2–1.2)
Total Protein: 6.6 g/dL (ref 6.0–8.3)

## 2019-11-30 LAB — TSH: TSH: 2.21 u[IU]/mL (ref 0.35–4.50)

## 2019-11-30 NOTE — Progress Notes (Signed)
Patient: Kim Clements MRN: [id number] DOB: May 18, 1966 PCP: Briscoe Deutscher, DO     Subjective:  Chief Complaint  Patient presents with  . Annual Exam  . Depression  . gad  . Hyperlipidemia    HPI: The patient is a 54 y.o. female who presents today for annual exam. She denies any changes to past medical history. There have been no recent hospitalizations. They are following a well balanced diet and exercise plan when she can. (walking).  She has planned to walk a couple times a week. Weight has been decreasing steadily. No complaints today.    Depression and anxiety: she thinks she was diagnosed as an adult, but feels like she struggled with this as a child. She has family hx in her mother. She has no hx of hospitalizations. 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 She is currently on zoloft 100mg  and elavil 10mg  at night. She is happy with medication. She is also in counseling that she does on a regular basis. She is trying to work on exercise.   Hyperlipidemia: on no medication. ASCVD score of 1.9% on last lab draw. Fh of MI in her mother at age 43 years. She does not smoke, have HTN or diabetes.   Due for tetanus booster, but just had her covid shot.   Immunization History  Administered Date(s) Administered  . Influenza Inj Mdck Quad Pf 07/01/2017  . Influenza, Seasonal, Injecte, Preservative Fre 07/01/2017  . Influenza,inj,Quad PF,6+ Mos 06/25/2018  . Influenza,inj,quad, With Preservative 06/09/2016  . PFIZER SARS-COV-2 Vaccination 11/05/2019, 11/26/2019  . Tdap 12/12/2006   Colonoscopy: 02/12/2016. F/u in 10 years.  Mammogram: 03/2019 Pap smear: 03/07/2017  covid shots done.   Review of Systems  Constitutional: Negative for appetite change, chills, fatigue and fever.  HENT: Negative for congestion, dental problem, ear pain, hearing loss, sinus pressure, sinus pain and trouble swallowing.   Eyes: Negative for visual disturbance.  Respiratory:  Negative for cough, chest tightness and shortness of breath.   Cardiovascular: Negative for chest pain, palpitations and leg swelling.  Gastrointestinal: Negative for abdominal pain, blood in stool, diarrhea, nausea and vomiting.  Endocrine: Negative for cold intolerance, polydipsia, polyphagia and polyuria.  Genitourinary: Negative for dysuria, frequency, hematuria and pelvic pain.  Musculoskeletal: Negative for arthralgias.  Skin: Negative for rash.  Neurological: Negative for dizziness, light-headedness and headaches.  Psychiatric/Behavioral: Negative for dysphoric mood and sleep disturbance. The patient is not nervous/anxious.     Allergies Patient is allergic to erythromycin; penicillins; and sulfa antibiotics.  Past Medical History Patient  has a past medical history of Anal fissure, Anxiety, Arthritis, Asthma, Colon polyps, Diverticulosis, GERD (gastroesophageal reflux disease), HLD (hyperlipidemia), and IBS (irritable bowel syndrome).  Surgical History Patient  has a past surgical history that includes Abdominal hysterectomy (2007); Tonsillectomy (1971); and laparoscopy (1988).  Family History Pateint's family history includes Breast cancer in her paternal aunt and paternal grandmother; Colon polyps in her father and mother; Heart disease in her maternal grandfather, maternal grandmother, mother, and paternal grandfather; Osteoarthritis in her mother; Prostate cancer in her paternal grandfather; Rheum arthritis in her mother; Stroke in her father.  Social History Patient  reports that she has never smoked. She has never used smokeless tobacco. She reports that she does not drink alcohol or use drugs.    Objective: Vitals:   11/30/19 1044 11/30/19 1057  BP:  110/82  Pulse:  85  Temp:  98.1 F (36.7 C)  TempSrc:  Temporal  SpO2:  96%  Weight: 167 lb 9.6 oz (76 kg) 167 lb 9.6 oz (76 kg)  Height:  5' 1.5" (1.562 m)    Body mass index is 31.15 kg/m.  Physical Exam Vitals  reviewed.  Constitutional:      Appearance: Normal appearance. She is well-developed. She is obese.  HENT:     Head: Normocephalic and atraumatic.     Right Ear: Tympanic membrane, ear canal and external ear normal.     Left Ear: Tympanic membrane, ear canal and external ear normal.     Nose: Nose normal.     Mouth/Throat:     Mouth: Mucous membranes are moist.  Eyes:     Extraocular Movements: Extraocular movements intact.     Conjunctiva/sclera: Conjunctivae normal.     Pupils: Pupils are equal, round, and reactive to light.  Neck:     Thyroid: No thyromegaly.  Cardiovascular:     Rate and Rhythm: Normal rate and regular rhythm.     Pulses: Normal pulses.     Heart sounds: Normal heart sounds. No murmur.  Pulmonary:     Effort: Pulmonary effort is normal.     Breath sounds: Normal breath sounds.  Abdominal:     General: Bowel sounds are normal. There is no distension.     Palpations: Abdomen is soft.     Tenderness: There is no abdominal tenderness.  Musculoskeletal:     Cervical back: Normal range of motion and neck supple.  Lymphadenopathy:     Cervical: No cervical adenopathy.  Skin:    General: Skin is warm and dry.     Capillary Refill: Capillary refill takes less than 2 seconds.     Findings: No rash.     Comments: SK over back   Neurological:     General: No focal deficit present.     Mental Status: She is alert and oriented to person, place, and time.     Cranial Nerves: No cranial nerve deficit.     Coordination: Coordination normal.     Deep Tendon Reflexes: Reflexes normal.  Psychiatric:        Mood and Affect: Mood normal.        Behavior: Behavior normal.          Office Visit from 11/30/2019 in [facility]  PHQ-9 Total Score  4     GAD 7 : Generalized Anxiety Score 11/30/2019  Nervous, Anxious, on Edge 1  Control/stop worrying 0  Worry too much - different things 1  Trouble relaxing 1  Restless 0  Easily annoyed or  irritable 0  Afraid - awful might happen 0  Total GAD 7 Score 3  Anxiety Difficulty Somewhat difficult     Assessment/plan: 1. Annual physical exam Routine fasting labs today. Chart reviewed and HM reviewed. Needs tdap, but just had covid shot. Have requested records. She has had a hard time getting records sent. Encouraged exercise, working on this. F/u in one year or as needed.  Patient counseling [x]    Nutrition: Stressed importance of moderation in sodium/caffeine intake, saturated fat and cholesterol, caloric balance, sufficient intake of fresh fruits, vegetables, fiber, calcium, iron, and 1 mg of folate supplement per day (for females capable of pregnancy).  [x]    Stressed the importance of regular exercise.   []    Substance Abuse: Discussed cessation/primary prevention of tobacco, alcohol, or other drug use; driving or other dangerous activities under the influence; availability of treatment for abuse.   [x]   Injury prevention: Discussed safety belts, safety helmets, smoke detector, smoking near bedding or upholstery.   [x]    Sexuality: Discussed sexually transmitted diseases, partner selection, use of condoms, avoidance of unintended pregnancy  and contraceptive alternatives.  [x]    Dental health: Discussed importance of regular tooth brushing, flossing, and dental visits.  [x]    Health maintenance and immunizations reviewed. Please refer to Health maintenance section.    - CBC with Differential/Platelet - Comprehensive metabolic panel - Lipid panel - TSH - VITAMIN D 25 Hydroxy (Vit-D Deficiency, Fractures)  2. Depression, recurrent (HCC) phq9 very well controlled. Happy on current medication. No desire to change and no refills needed at this time. F/u in 6 months. Continue counseling and encouraged exercise.   3. GAD (generalized anxiety disorder) gad7 very well controlled. Continue current therapy and counseling. Encouraged exercise. F/u in 6 months .  4. Mixed  hyperlipidemia Fasting labs today. ascvd risk is low and likely does not meet guidelines for medication. F/u on labs. Encouraged lifestyle changes/diet/exericse.  The 10-year ASCVD risk score DC ., et al., 2013) is: 1.9%   This visit occurred during the SARS-CoV-2 public health emergency.  Safety protocols were in place, including screening questions prior to the visit, additional usage of staff PPE, and extensive cleaning of exam room while observing appropriate contact time as indicated for disinfecting solutions.    Return in about 6 months (around 06/01/2020) for depression/anxiety .  Montez Hageman, MD [facility]  11/30/2019

## 2019-11-30 NOTE — Patient Instructions (Signed)
Doing great! Labs today!  So good to see you.  Dr. Rogers Blocker   Preventive Care 79-55 Years Old, Female Preventive care refers to visits with your health care provider and lifestyle choices that can promote health and wellness. This includes:  A yearly physical exam. This may also be called an annual well check.  Regular dental visits and eye exams.  Immunizations.  Screening for certain conditions.  Healthy lifestyle choices, such as eating a healthy diet, getting regular exercise, not using drugs or products that contain nicotine and tobacco, and limiting alcohol use. What can I expect for my preventive care visit? Physical exam Your health care provider will check your:  Height and weight. This may be used to calculate body mass index (BMI), which tells if you are at a healthy weight.  Heart rate and blood pressure.  Skin for abnormal spots. Counseling Your health care provider may ask you questions about your:  Alcohol, tobacco, and drug use.  Emotional well-being.  Home and relationship well-being.  Sexual activity.  Eating habits.  Work and work Statistician.  Method of birth control.  Menstrual cycle.  Pregnancy history. What immunizations do I need?  Influenza (flu) vaccine  This is recommended every year. Tetanus, diphtheria, and pertussis (Tdap) vaccine  You may need a Td booster every 10 years. Varicella (chickenpox) vaccine  You may need this if you have not been vaccinated. Zoster (shingles) vaccine  You may need this after age 26. Measles, mumps, and rubella (MMR) vaccine  You may need at least one dose of MMR if you were born in 1957 or later. You may also need a second dose. Pneumococcal conjugate (PCV13) vaccine  You may need this if you have certain conditions and were not previously vaccinated. Pneumococcal polysaccharide (PPSV23) vaccine  You may need one or two doses if you smoke cigarettes or if you have certain  conditions. Meningococcal conjugate (MenACWY) vaccine  You may need this if you have certain conditions. Hepatitis A vaccine  You may need this if you have certain conditions or if you travel or work in places where you may be exposed to hepatitis A. Hepatitis B vaccine  You may need this if you have certain conditions or if you travel or work in places where you may be exposed to hepatitis B. Haemophilus influenzae type b (Hib) vaccine  You may need this if you have certain conditions. Human papillomavirus (HPV) vaccine  If recommended by your health care provider, you may need three doses over 6 months. You may receive vaccines as individual doses or as more than one vaccine together in one shot (combination vaccines). Talk with your health care provider about the risks and benefits of combination vaccines. What tests do I need? Blood tests  Lipid and cholesterol levels. These may be checked every 5 years, or more frequently if you are over 76 years old.  Hepatitis C test.  Hepatitis B test. Screening  Lung cancer screening. You may have this screening every year starting at age 55 if you have a 30-pack-year history of smoking and currently smoke or have quit within the past 15 years.  Colorectal cancer screening. All adults should have this screening starting at age 55 and continuing until age 18. Your health care provider may recommend screening at age 19 if you are at increased risk. You will have tests every 1-10 years, depending on your results and the type of screening test.  Diabetes screening. This is done by checking your blood  sugar (glucose) after you have not eaten for a while (fasting). You may have this done every 1-3 years.  Mammogram. This may be done every 1-2 years. Talk with your health care provider about when you should start having regular mammograms. This may depend on whether you have a family history of breast cancer.  BRCA-related cancer screening. This  may be done if you have a family history of breast, ovarian, tubal, or peritoneal cancers.  Pelvic exam and Pap test. This may be done every 3 years starting at age 55. Starting at age 37, this may be done every 5 years if you have a Pap test in combination with an HPV test. Other tests  Sexually transmitted disease (STD) testing.  Bone density scan. This is done to screen for osteoporosis. You may have this scan if you are at high risk for osteoporosis. Follow these instructions at home: Eating and drinking  Eat a diet that includes fresh fruits and vegetables, whole grains, lean protein, and low-fat dairy.  Take vitamin and mineral supplements as recommended by your health care provider.  Do not drink alcohol if: ? Your health care provider tells you not to drink. ? You are pregnant, may be pregnant, or are planning to become pregnant.  If you drink alcohol: ? Limit how much you have to 0-1 drink a day. ? Be aware of how much alcohol is in your drink. In the [place] one drink equals one 12 oz bottle of beer (355 mL), one 5 oz glass of wine (148 mL), or one 1 oz glass of hard liquor (44 mL). Lifestyle  Take daily care of your teeth and gums.  Stay active. Exercise for at least 30 minutes on 5 or more days each week.  Do not use any products that contain nicotine or tobacco, such as cigarettes, e-cigarettes, and chewing tobacco. If you need help quitting, ask your health care provider.  If you are sexually active, practice safe sex. Use a condom or other form of birth control (contraception) in order to prevent pregnancy and STIs (sexually transmitted infections).  If told by your health care provider, take low-dose aspirin daily starting at age 39. What's next?  Visit your health care provider once a year for a well check visit.  Ask your health care provider how often you should have your eyes and teeth checked.  Stay up to date on all vaccines. This information is not  intended to replace advice given to you by your health care provider. Make sure you discuss any questions you have with your health care provider. Document Revised: 05/04/2018 Document Reviewed: 05/04/2018 Elsevier Patient Education  2020 [employer].

## 2019-12-05 ENCOUNTER — Ambulatory Visit (INDEPENDENT_AMBULATORY_CARE_PROVIDER_SITE_OTHER): Payer: 59 | Admitting: Psychiatry

## 2019-12-05 ENCOUNTER — Other Ambulatory Visit: Payer: Self-pay

## 2019-12-05 DIAGNOSIS — F411 Generalized anxiety disorder: Secondary | ICD-10-CM

## 2019-12-05 NOTE — Progress Notes (Signed)
      [facility] Counselor/Therapist Progress Note  Patient ID: Kim Clements, MRN: [id number],    Date: 12/05/2019  Time Spent: 60 minutes  4:00pm to 5:00pm  Treatment Type: Individual Therapy  Reported Symptoms: anxiety, frustration, difficulty making changes  Mental Status Exam:  Appearance:   Casual     Behavior:  Appropriate and Sharing  Motor:  Normal  Speech/Language:   Normal Rate  Affect:  anxious  Mood:  anxious  Thought process:  normal  Thought content:    some obsessive thoughts   Sensory/Perceptual disturbances:    WNL  Orientation:  oriented to person, place, time/date, situation, day of week, month of year and year  Attention:  Good  Concentration:  Good  Memory:  Some forgetfulness, worse under stress  Fund of knowledge:   Good  Insight:    Good  Judgment:   Good  Impulse Control:  Good   Risk Assessment: Danger to Self:  No Self-injurious Behavior: No Danger to Others: No Duty to Warn:no Physical Aggression / Violence:No  Access to Firearms a concern: No  Gang Involvement:No   Subjective: Patient in today reporting frustration and anxiety.  Rates herself as a "7" on  1-10 anxiety scale.  Most anxious about Covid, family,    Interventions: Cognitive Behavioral Therapy and Ego-Supportive  Diagnosis:   ICD-10-CM   1. Generalized anxiety disorder  F41.1      Plan: Patient not signing tx plan on computer screen due to COVID.  Treatment Goals: Goals may remain on tx plan as patient works to achieve her goals. Progress will be documented each session in the "Progress" section of Plan.  Long Term goal: Reduce overall level, frequency, and intensity of the anxiety so that daily functioning is not impaired.   Short Term goal: Verbalize an understanding of the role that fearful thinking plays in creating fears, excessive worry, and persistent anxiety symptoms . Strategy:  Explore cognitive messages that mediate anxiety response  and retrain in adaptive cognitions.  Develop behavioral and cognitive strategies to reduce of eliminate the irrational anxiety. Help client develop healthy self-talk as a means of handling the anxiety.  (Progressing) Continued progress on her anxiety reduction but very gradual.  Staying off facebook helps, better weather helps, deep breathing helps.  Still working on modulating her speech and stopping at end of every sentence, which she says correlates with her anxiety level. Talked about family relationships within family that are changing some and daughter heading into college, and along with this comes some anxiety, apprehension, and mixed feelings.  Patient talked about some of her anxiety and apprehension. Also worked on "not assuming the worst, not thinking about what might go wrong versus right, and needing to stay in the present rather than bringing up the past or jumping too far ahead into the future. Another focus is to look at what she can control and let go of what she can't. To get back on self-monitoring her thoughts and interrupting the anxious ones specifically and replace them with more positive, reality-based,  And empowering thoughts that do not support anxiety/stress/negativity/fears. Will follow up on this between sessions. Goal review and progress noted with patient.  Next session within 2 weeks.   Mathis Fare, LCSW

## 2019-12-11 ENCOUNTER — Other Ambulatory Visit: Payer: Self-pay

## 2019-12-11 ENCOUNTER — Ambulatory Visit (INDEPENDENT_AMBULATORY_CARE_PROVIDER_SITE_OTHER): Payer: 59 | Admitting: Psychiatry

## 2019-12-11 DIAGNOSIS — F411 Generalized anxiety disorder: Secondary | ICD-10-CM | POA: Diagnosis not present

## 2019-12-11 NOTE — Progress Notes (Signed)
      [facility] Counselor/Therapist Progress Note  Patient ID: Kim Clements, MRN: [id number],    Date: 12/11/2019  Time Spent: 60 minutes   1:00pm to 2:00pm  Treatment Type: Individual Therapy  Reported Symptoms: anxiety   Mental Status Exam:  Appearance:   Casual     Behavior:  Appropriate and Sharing  Motor:  Normal  Speech/Language:   Clear and Coherent  Affect:  anxious  Mood:  anxious  Thought process:  goal directed  Thought content:    WNL  Sensory/Perceptual disturbances:    WNL  Orientation:  oriented to person, place, time/date, situation, day of week, month of year and year  Attention:  Good/Fair  Concentration:  Good/Fair  Memory:  some forgetfulness  Fund of knowledge:   Good  Insight:    Good  Judgment:   Good  Impulse Control:  Good   Risk Assessment: Danger to Self:  No Self-injurious Behavior: No Danger to Others: No Duty to Warn:no Physical Aggression / Violence:No  Access to Firearms a concern: No  Gang Involvement:No   Subjective: Patient in today with anxiety and reports she is working harder to not let her anxiety control her and lessen her fears/anxious thoughts.  Did get out for church this Sunday, outside service.  Interventions: Cognitive Behavioral Therapy and Solution-Oriented/Positive Psychology  Diagnosis:   ICD-10-CM   1. Generalized anxiety disorder  F41.1     Plan: Patient not signing tx plan on computer screen due to COVID.  Treatment Goals: Goals may remain on tx plan as patient works to achieve her goals. Progress will be documented each session in the "Progress" section of Plan.  Long Term goal: Reduce overall level, frequency, and intensity of the anxiety so that daily functioning is not impaired.   Short Term goal: Verbalize an understanding of the role that fearful thinking plays in creating fears, excessive worry, and persistent anxiety symptoms . Strategy:  Explore cognitive messages that  mediate anxiety response and retrain in adaptive cognitions.  Develop behavioral and cognitive strategies to reduce of eliminate the irrational anxiety. Help client develop healthy self-talk as a means of handling the anxiety.  (Progressing) Patient today reports continues anxiety and knows she needs to be more consistent with her efforts to decrease her anxiety. Feels that with some improvement in Covid situation has helped her with her anxiety,  But realizing exposure to FB recently, how addicting it can be.  Plans to set limits of 15 minutes, 1-2 times daily rather that being off it completely at this point. Patient shared several examples of recent anxious thoughts and wants to be able to interrupt them.  We worked with several of the thoughts today and challenged them to show patient how pervasive her anxious thoughts have been and how none of the anxious/fearful thoughts have come true. Patient seemed to connect with this, and was surprised when she compared her anxious thoughts/fears versus what actually happens. Processed this some and she is to practice challenging anxious thoughts as they arise before next appt.  Goal review and progress noted.   Next appt within 2 weeks.   Mathis Fare, LCSW

## 2019-12-18 ENCOUNTER — Other Ambulatory Visit: Payer: Self-pay | Admitting: Family Medicine

## 2019-12-18 DIAGNOSIS — G8929 Other chronic pain: Secondary | ICD-10-CM

## 2019-12-18 DIAGNOSIS — M25572 Pain in left ankle and joints of left foot: Secondary | ICD-10-CM

## 2019-12-21 ENCOUNTER — Ambulatory Visit (INDEPENDENT_AMBULATORY_CARE_PROVIDER_SITE_OTHER): Payer: 59 | Admitting: Psychiatry

## 2019-12-21 ENCOUNTER — Other Ambulatory Visit: Payer: Self-pay

## 2019-12-21 DIAGNOSIS — F411 Generalized anxiety disorder: Secondary | ICD-10-CM

## 2019-12-21 NOTE — Progress Notes (Signed)
[facility] Counselor/Therapist Progress Note  Patient ID: Kim Clements, MRN: [id number],    Date: 12/21/2019  Time Spent:  60 minutes 12:00noon to 1:00pm  Treatment Type: Individual Therapy  Reported Symptoms: anxiety, frustration, mild depression, some obsessivness  Mental Status Exam:  Appearance:   Casual     Behavior:  Appropriate, Sharing and Motivated  Motor:  Normal  Speech/Language:   Normal Rate most of session, sometimes gets faster when anxiety heightens  Affect:  anxious, some depression  Mood:  anxious  Thought process:  normal  Thought content:    some obsessiveness  Sensory/Perceptual disturbances:    WNL  Orientation:  oriented to person, place, time/date, situation, day of week, month of year and year  Attention:  Good  Concentration:  Good  Memory:  some forgetfulness  Fund of knowledge:   Good  Insight:    Good/Fair  Judgment:   Good/Fair  Impulse Control:  Good/Fair   Risk Assessment: Danger to Self:  No Self-injurious Behavior: No Danger to Others: No Duty to Warn:no Physical Aggression / Violence:No  Access to Firearms a concern: No  Gang Involvement:No   Subjective:  Patient in today with anxiety, frustratiion, and mild depression, some obsessiveness. Some progress in her anxiety on specific issues such as Covid but is "a work in progress".  Interventions: Cognitive Behavioral Therapy and Solution-Oriented/Positive Psychology  Diagnosis:   ICD-10-CM   1. Generalized anxiety disorder  F41.1     Plan: Patient not signing tx plan on computer screen due to COVID.  Treatment Goals: Goals may remain on tx plan as patient works to achieve her goals. Progress will be documented each session in the "Progress" section of Plan.  Long Term goal: Reduce overall level, frequency, and intensity of the anxiety so that daily functioning is not impaired.   Short Term goal: Verbalize an understanding of the role that fearful  thinking plays in creating fears, excessive worry, and persistent anxiety symptoms . Strategy:  Explore cognitive messages that mediate anxiety response and retrain in adaptive cognitions.  Develop behavioral and cognitive strategies to reduce of eliminate the irrational anxiety. Help client develop healthy self-talk as a means of handling the anxiety.  (Progressing) Patient today reports her anxiety to be a "7 or 8" on a scale of 1-10 for anxiety with 10 being the highest. Continues to work on her anxiety, setting some limits with exposing herself to things that she has realized feeds her anxiety. Feels she is some better as "I'm not freaking out quite as much about things that do aggravate my anxiety."  Shared examples of how she sees herself being some less anxious.  Reviewed with patient the role that fearful thinking plays in her escalated worry and anxiety. Patient  has reported some success in "talking back to the fearful thoughts" and encouraged her to give this more attention at this point, in addition to replacing anxious thoughts with more positive, reality-based, calming thoughts as practiced some before in previous session. Encouraged more consistency in her efforts.  Is back on facebook which has fed her anxiety in the past but she states she has limited the type of info she reads and is not looking at things that support anxiety and fears.  Reports no increase in her anxiety since getting back on facebook.  Encouraged good self-care including getting outside more, walking, being in contact with supportive family, improved sleep patterns, positive self-talk, and the limiting consumption of online/TV news and other  resources that support anxiety/tension/and fearful thinking.  Goal review and progress noted with patient, along with challenges  Next appt within 2 weeks.   Shanon Ace, LCSW

## 2019-12-25 ENCOUNTER — Other Ambulatory Visit: Payer: Self-pay | Admitting: Physician Assistant

## 2019-12-28 ENCOUNTER — Ambulatory Visit (INDEPENDENT_AMBULATORY_CARE_PROVIDER_SITE_OTHER): Payer: 59 | Admitting: Psychiatry

## 2019-12-28 ENCOUNTER — Other Ambulatory Visit: Payer: Self-pay

## 2019-12-28 DIAGNOSIS — F411 Generalized anxiety disorder: Secondary | ICD-10-CM

## 2019-12-28 NOTE — Progress Notes (Signed)
      [facility] Counselor/Therapist Progress Note  Patient ID: Regena Delucchi, MRN: [id number],    Date: 12/28/2019  Time Spent: 60 minutes   11:00am to 12:00noon   Treatment Type: Individual Therapy  Reported Symptoms: anxiety, fearful thoughts, overthinking, stressed  Mental Status Exam:  Appearance:   Casual     Behavior:  Appropriate and Sharing  Motor:  Normal  Speech/Language:   sometime faster when more anxious  Affect:  anxious  Mood:  anxious  Thought process:  goal directed  Thought content:    some obsessiveness at times  Sensory/Perceptual disturbances:    WNL  Orientation:  oriented to person, place, time/date, situation, day of week, month of year and year  Attention:  Good/Fair  Concentration:  Good/Fair  Memory:  some forgetfulness worse when stressed  Fund of knowledge:   Good  Insight:    Good/Fair  Judgment:   Good  Impulse Control:  Good/Fair   Risk Assessment: Danger to Self:  No Self-injurious Behavior: No Danger to Others: No Duty to Warn:no Physical Aggression / Violence:No  Access to Firearms a concern: No  Gang Involvement:No   Subjective: Patient in today with anxiety, fearful thoughts, overthinking.     Interventions: Cognitive Behavioral Therapy and Solution-Oriented/Positive Psychology  Diagnosis:   ICD-10-CM   1. Generalized anxiety disorder  F41.1     Plan: Patient not signing tx plan on computer screen due to COVID.  Treatment Goals: Goals may remain on tx plan as patient works to achieve her goals. Progress will be documented each session in the "Progress" section of Plan.  Long Term goal: Reduce overall level, frequency, and intensity of the anxiety so that daily functioning is not impaired.   Short Term goal: Verbalize an understanding of the role that fearful thinking plays in creating fears, excessive worry, and persistent anxiety symptoms . Strategy:  Explore cognitive messages that mediate anxiety  response and retrain in adaptive cognitions.  Develop behavioral and cognitive strategies to reduce of eliminate the irrational anxiety. Help client develop healthy self-talk as a means of handling the anxiety.  (Progressing) Patient in today with increased anxiety mostly focused on Covid, daughter graduating and indecision, and "bad/anxiety-provoking things that happen in our country."  Also very prone to watch world news on TV or online that is distressing and that adds to her anxiety level. With some of her anxiety I observed more today that there is a bit of an obsessive quality at times. Worked with patient to help her see and better understand the obsessive nature to some of her anxious/fearful thoughts. Challenged several of her more frequent anxious thoughts and worked collaboratively on changing them to be more positive, reality-based, less obsessive, and empowering thought patterns that do not support anxiety. Rates self as a "6-7" on 1-10 anxiety scale today. Worked with her on ways she can practice this more at home, and the need to be more regular in her practicing. Seemed to understand how her anxious thoughts at times have some obsessiveness to them, and was more grounded by session end.    Goal review and progress noted with patient.  Next appt within 2 weeks.   Mathis Fare, LCSW

## 2020-01-02 ENCOUNTER — Ambulatory Visit (INDEPENDENT_AMBULATORY_CARE_PROVIDER_SITE_OTHER): Payer: 59 | Admitting: Psychiatry

## 2020-01-02 ENCOUNTER — Other Ambulatory Visit: Payer: Self-pay

## 2020-01-02 DIAGNOSIS — F411 Generalized anxiety disorder: Secondary | ICD-10-CM | POA: Diagnosis not present

## 2020-01-02 NOTE — Progress Notes (Signed)
      [facility] Counselor/Therapist Progress Note  Patient ID: Kim Clements, MRN: [id number],    Date: 01/02/2020  Time Spent: 60 minutes   1:00pm to 2:00pm  Treatment Type: Individual Therapy  Reported Symptoms: anxiety, some depression,  Mental Status Exam:  Appearance:   Casual     Behavior:  Appropriate and Sharing  Motor:  Normal  Speech/Language:   Normal Rate, sometimes accelerates when anxious  Affect:  Anxious, some depression  Mood:  anxious and some depression  Thought process:  goal directed  Thought content:    some obsessiveness  Sensory/Perceptual disturbances:    WNL  Orientation:  oriented to person, place, time/date, situation, day of week, month of year and year  Attention:  Good/Fair  Concentration:  Good/Fair  Memory:  forgetfulness  Fund of knowledge:   Good  Insight:    Good  Judgment:   Good  Impulse Control:  Good   Risk Assessment: Danger to Self:  No Self-injurious Behavior: No Danger to Others: No Duty to Warn:no Physical Aggression / Violence:No  Access to Firearms a concern: No  Gang Involvement:No   Subjective: Patient in today with anxiety, depression, and some obsessiveness in certain thoughts.  Interventions: Cognitive Behavioral Therapy and Ego-Supportive  Diagnosis:   ICD-10-CM   1. Generalized anxiety disorder  F41.1      Plan: Patient not signing tx plan on computer screen due to COVID.  Treatment Goals: Goals may remain on tx plan as patient works to achieve her goals. Progress will be documented each session in the "Progress" section of Plan.  Long Term goal: Reduce overall level, frequency, and intensity of the anxiety so that daily functioning is not impaired.   Short Term goal: Verbalize an understanding of the role that fearful thinking plays in creating fears, excessive worry, and persistent anxiety symptoms . Strategy:  Explore cognitive messages that mediate anxiety response and retrain  in adaptive cognitions.  Develop behavioral and cognitive strategies to reduce of eliminate the irrational anxiety. Help client develop healthy self-talk as a means of handling the anxiety.  (Progressing) Patient in reporting anxiety and some depression. Questioned about some obsessiveness. Looked at some habits that have worsened during the pandemic include: "checking the news online/TV, facebook news, more socially isolated from other people, and her tendency to look for what might go wrong versus right, and to talk a lot and be tuned in to negative things." I find myself thinking sometimes overthinking and "I need to get out of my head."  Observed how these tend to involve anxious thoughts and worked on the thought replacement process in converting the anxious thoughts into more reality-based, positive, and empowering thoughts that do not lead to anxiety. Practiced this with several of her more frequent anxious/fearful thoughts with success. Also reviewed the behavior changes that need to happen in order to reduce some parts of her anxiety and feeling more "stressed", including planning ahead and leaving earlier in order to arrive at destinations on time for meetings, appts, etc. Made some good entries in her therapy notebook today and is to review them daily, as they also accentuate her positives.  Goal review and progress noted with patient.  Next appt within 2 weeks.   Mathis Fare, LCSW

## 2020-01-04 ENCOUNTER — Other Ambulatory Visit: Payer: Self-pay | Admitting: Physician Assistant

## 2020-01-04 NOTE — Telephone Encounter (Signed)
Patient needs office visit.  

## 2020-01-11 ENCOUNTER — Telehealth: Payer: Self-pay | Admitting: Physician Assistant

## 2020-01-11 NOTE — Telephone Encounter (Signed)
Pt wants to know how many lactaid pills she can have with one meal.

## 2020-01-14 NOTE — Telephone Encounter (Signed)
I left her a message advising she follow the package directions or avoided the dairy if she knows if upsets her stomach.  She has follow up with you at the end of June.

## 2020-01-14 NOTE — Telephone Encounter (Signed)
Ok thanks 

## 2020-01-15 ENCOUNTER — Ambulatory Visit (INDEPENDENT_AMBULATORY_CARE_PROVIDER_SITE_OTHER): Payer: 59 | Admitting: Psychiatry

## 2020-01-15 ENCOUNTER — Other Ambulatory Visit: Payer: Self-pay

## 2020-01-15 DIAGNOSIS — F411 Generalized anxiety disorder: Secondary | ICD-10-CM

## 2020-01-15 NOTE — Progress Notes (Signed)
[facility] Counselor/Therapist Progress Note  Patient ID: Kim Clements, MRN: [id number],    Date: 01/15/2020  Time Spent:  60 minutes  12:00noon to 1:00pm  Treatment Type: Individual Therapy  Reported Symptoms: anxiety, stress, some "occasional" depression, concerned about her mom and some memory issues  Mental Status Exam:  Appearance:   Casual     Behavior:  Appropriate and Sharing  Motor:  Normal  Speech/Language:   Normal Rate  Affect:  anxious  Mood:  anxious and some depression  Thought process:  goal directed  Thought content:    WNL  Sensory/Perceptual disturbances:    WNL  Orientation:  oriented to person, place, time/date, situation, day of week, month of year and year  Attention:  Good  Concentration:  Good  Memory:  some forgetfulness  Fund of knowledge:   Good  Insight:    Good  Judgment:   Good  Impulse Control:  Good   Risk Assessment: Danger to Self:  No Self-injurious Behavior: No Danger to Others: No Duty to Warn:no Physical Aggression / Violence:No  Access to Firearms a concern: No  Gang Involvement:No   Subjective: Patient in today with anxiety, sadness, and some depression.  Worries about her elderly mom and other family circumstances.  More stressed recently mom's health concerns and some memory issues.   Interventions: Cognitive Behavioral Therapy and Ego-Supportive  Diagnosis:   ICD-10-CM   1. Generalized anxiety disorder  F41.1     Plan: Patient not signing tx plan on computer screen due to COVID.  Treatment Goals: Goals may remain on tx plan as patient works to achieve her goals. Progress will be documented each session in the "Progress" section of Plan.  Long Term goal: Reduce overall level, frequency, and intensity of the anxiety so that daily functioning is not impaired.   Short Term goal: Verbalize an understanding of the role that fearful thinking plays in creating fears, excessive worry, and persistent  anxiety symptoms . Strategy:  Explore cognitive messages that mediate anxiety response and retrain in adaptive cognitions.  Develop behavioral and cognitive strategies to reduce of eliminate the irrational anxiety. Help client develop healthy self-talk as a means of handling the anxiety.  (Progressing) Patient in today with anxiety, stress, some depression, and concerns about her mom's health decline.  "Very sad to see her in pain."  Processed her sadness at length today. Tending to assume the worst and looking for what may go wrong versus fight. Hard to accept that she can't control some things and to focus on what she can control.  Very hard to stay in the present and not jump ahead and adding to her worry.  Trying to still limit her consumption of online "distressing" news.  Admits she is still working to "get out of my head as I know I sometimes overthink." Talked more about her commitment needed in order to make positive changes, understanding that this involves changing some things that have been habitual for her and it won't be easy, but can be life-changing for her and well worth the effort needed.  Worked more today on the changing of her anxious thoughts and also the strategy above of creating more positive and adaptive cognitions.  Will continue this next session and she is to focus on this and her commitment to change.    Goal review and progress/challenges noted with patient.  Next appt within 2 weeks.    Mathis Fare, LCSW

## 2020-01-30 ENCOUNTER — Ambulatory Visit (INDEPENDENT_AMBULATORY_CARE_PROVIDER_SITE_OTHER): Payer: 59 | Admitting: Psychiatry

## 2020-01-30 ENCOUNTER — Other Ambulatory Visit: Payer: Self-pay

## 2020-01-30 DIAGNOSIS — F411 Generalized anxiety disorder: Secondary | ICD-10-CM

## 2020-01-30 NOTE — Progress Notes (Signed)
[facility] Counselor/Therapist Progress Note  Patient ID: Kim Clements, MRN: [id number],    Date: 01/30/2020  Time Spent: 60 minutes  12:00noon to 1:00pm  Treatment Type: Individual Therapy  Reported Symptoms: anxiety, "worries", some bittersweetness with daughter graduating  Mental Status Exam:  Appearance:   Casual     Behavior:  Appropriate and Sharing  Motor:  Normal  Speech/Language:   Normal Rate and at times talking faster  Affect:  anxious  Mood:  anxious  Thought process:  goal directed  Thought content:    WNL  Sensory/Perceptual disturbances:    WNL  Orientation:  oriented to person, place, time/date, situation, day of week, month of year and year  Attention:  Good  Concentration:  Good  Memory:  some forgetfulness and worse under stress  Fund of knowledge:   Good  Insight:    Good and Fair  Judgment:   Good  Impulse Control:  Good and Fair   Risk Assessment: Danger to Self:  No Self-injurious Behavior: No Danger to Others: No Duty to Warn:no Physical Aggression / Violence:No  Access to Firearms a concern: No  Gang Involvement:No   Subjective: Patient today reporting "worries, anxiety and bittersweet feelings with daugther graduating." Relieved that all worked out ok during the graduation time.  Looking forward to a family vacation within a couple weeks.  Interventions: Solution-Oriented/Positive Psychology and Ego-Supportive  Diagnosis:   ICD-10-CM   1. Generalized anxiety disorder  F41.1      Plan: Patient not signing tx plan on computer screen due to COVID.  Treatment Goals: Goals may remain on tx plan as patient works to achieve her goals. Progress will be documented each session in the "Progress" section of Plan.  Long Term goal: Reduce overall level, frequency, and intensity of the anxiety so that daily functioning is not impaired.   Short Term goal: Verbalize an understanding of the role that fearful thinking  plays in creating fears, excessive worry, and persistent anxiety symptoms . Strategy:  Explore cognitive messages that mediate anxiety response and retrain in adaptive cognitions.  Develop behavioral and cognitive strategies to reduce of eliminate the irrational anxiety. Help client develop healthy self-talk as a means of handling the anxiety.  (Progressing) Patient in today reporting some improvement in her anxiety although stressed recently with daughter graduating.  Also admits she still needs to work on her anxiety, and work on "dealing with issues with her aging mother".  Tends to minimize her anxiety at times. Struggles with not being able to control things more and worked with her some today on this, as several of her concerns are totally out of her control, and stressing over them makes it work for her and depletes her energy. Still encouraging her to limit her watching of TV and online news that is distressing and feeds patient's anxiety. Discussed her overthinking and ways to interrupt it as the overthinking tends to spiral with patient often leading to inaccurate assumptions or "fearing the worst".  Acknowledges we are working and trying to help her change long term destructive behaviors. Processed some of her thoughts and feelings about how she has felt in past these behaviors have actually helped her, when in reality they haven't been helpful in her coping. Encouraged her commitment to making tough changes that are difficult but can really help her in moving forward.  Goal review and progress noted with patient.  Next appt within 2 weeks.   Mathis Fare, LCSW

## 2020-02-03 ENCOUNTER — Other Ambulatory Visit: Payer: Self-pay | Admitting: Family Medicine

## 2020-02-03 ENCOUNTER — Other Ambulatory Visit: Payer: Self-pay | Admitting: Physician Assistant

## 2020-02-03 DIAGNOSIS — G8929 Other chronic pain: Secondary | ICD-10-CM

## 2020-02-03 DIAGNOSIS — F4323 Adjustment disorder with mixed anxiety and depressed mood: Secondary | ICD-10-CM

## 2020-02-03 DIAGNOSIS — M25572 Pain in left ankle and joints of left foot: Secondary | ICD-10-CM

## 2020-02-05 ENCOUNTER — Telehealth: Payer: Self-pay | Admitting: Physician Assistant

## 2020-02-05 MED ORDER — VIBERZI 75 MG PO TABS: 1.0000 | ORAL_TABLET | Freq: Two times a day (BID) | ORAL | 0 refills | Status: DC

## 2020-02-05 NOTE — Telephone Encounter (Signed)
Faxed refill to pharmacy

## 2020-02-06 NOTE — Telephone Encounter (Signed)
Spoke with patient and informed her prescription was sent into pharmacy again yesterday at 4:55 pm. She will call [facility] to confirm they received it.

## 2020-02-07 ENCOUNTER — Ambulatory Visit: Payer: 59 | Admitting: Psychiatry

## 2020-02-21 ENCOUNTER — Ambulatory Visit (INDEPENDENT_AMBULATORY_CARE_PROVIDER_SITE_OTHER): Payer: 59 | Admitting: Psychiatry

## 2020-02-21 ENCOUNTER — Other Ambulatory Visit: Payer: Self-pay

## 2020-02-21 DIAGNOSIS — F411 Generalized anxiety disorder: Secondary | ICD-10-CM | POA: Diagnosis not present

## 2020-02-21 NOTE — Progress Notes (Signed)
      [facility] Counselor/Therapist Progress Note  Patient ID: Kim Clements, MRN: [id number],    Date: 02/21/2020  Time Spent:  60 minutes    4:00pm to 5:00pm  Treatment Type: Individual Therapy  Reported Symptoms: anxiety, some obsessiveness, lack of motivation  Mental Status Exam:  Appearance:   Casual     Behavior:  Appropriate and Sharing  Motor:  Normal  Speech/Language:   Normal Rate  Affect:  anxious  Mood:  anxious  Thought process:  normal  Thought content:    some obsessiveness  Sensory/Perceptual disturbances:    WNL  Orientation:  oriented to person, place, time/date, situation, day of week, month of year and year  Attention:  Good  Concentration:  Good and Fair  Memory:  some forgetfulness  Fund of knowledge:   Good  Insight:    Good and Fair  Judgment:   Good and Fair  Impulse Control:  Good and Fair   Risk Assessment: Danger to Self:  No Self-injurious Behavior: No Danger to Others: No Duty to Warn:no Physical Aggression / Violence:No  Access to Firearms a concern: No  Gang Involvement:No   Subjective: Patient today experiencing anxiety and some obsessiveness. Had family trip away and enjoyed it but still stressed and anxious.   Interventions: Cognitive Behavioral Therapy, Solution-Oriented/Positive Psychology and Ego-Supportive  Diagnosis:   ICD-10-CM   1. Generalized anxiety disorder  F41.1     Plan:  Patient not signing tx plan on computer screen due to COVID.  Treatment Goals: Goals may remain on tx plan as patient works to achieve her goals. Progress will be documented each session in the "Progress" section of Plan.  Long Term goal: Reduce overall level, frequency, and intensity of the anxiety so that daily functioning is not impaired.   Short Term goal: Verbalize an understanding of the role that fearful thinking plays in creating fears, excessive worry, and persistent anxiety symptoms . Strategy:  Explore cognitive  messages that mediate anxiety response and retrain in adaptive cognitions.  Develop behavioral and cognitive strategies to reduce of eliminate the irrational anxiety. Help client develop healthy self-talk as a means of handling the anxiety.  (Progressing) Patient in today reporting anxiety "but no worse".  Still some obsessing when anxiety heightens.  Still working on Manufacturing systems engineer, listening and talking, especially when anxious of angry. Notices that when she "re-hashes" things that irritate her or anger her, her anxiety escalates. Encouraged her to still be working on strategy above of  Developing behavioral and cognitive strategies to reduce the irrational anxiety she experiences.  Is recognizing it more but very hard to change as she has "been this way a long time."  Explored the "positives" for her in working on these strategies and reviewed CBT model with her usiing CBT poster in office, as it provides patients with a good "visual" and improves patient  Understanding. On 1-10 scale today, rates anxiety a "6" which represents a lower, more improved number for patient.  Goal review and progress noted with patient.  Next appt within 2 weeks.   Mathis Fare, LCSW

## 2020-02-27 ENCOUNTER — Ambulatory Visit: Payer: 59 | Admitting: Gastroenterology

## 2020-03-05 ENCOUNTER — Ambulatory Visit: Payer: 59 | Admitting: Gastroenterology

## 2020-03-06 ENCOUNTER — Ambulatory Visit: Payer: 59 | Admitting: Psychiatry

## 2020-03-20 ENCOUNTER — Other Ambulatory Visit: Payer: Self-pay

## 2020-03-20 ENCOUNTER — Ambulatory Visit (INDEPENDENT_AMBULATORY_CARE_PROVIDER_SITE_OTHER): Payer: 59 | Admitting: Psychiatry

## 2020-03-20 DIAGNOSIS — F411 Generalized anxiety disorder: Secondary | ICD-10-CM | POA: Diagnosis not present

## 2020-03-20 NOTE — Progress Notes (Signed)
      [facility] Counselor/Therapist Progress Note  Patient ID: Kim Clements, MRN: [id number],    Date: 03/20/2020  Time Spent:  60 minutes  11:00am to 12:00noon  Treatment Type: Individual Therapy  Reported Symptoms: anxiety, overthinking, some obsessiveness mostly when anxious  Mental Status Exam:  Appearance:   Casual     Behavior:  Appropriate, Sharing and a little motivated and more than usual  Motor:  Normal  Speech/Language:   Clear and Coherent  Affect:  anxious  Mood:  anxious  Thought process:  normal  Thought content:    some obsessiveness  Sensory/Perceptual disturbances:    WNL  Orientation:  oriented to person, place, time/date, situation, day of week, month of year and year  Attention:  Good  Concentration:  Good and Fair  Memory:  some forgetfulness  Fund of knowledge:   Good  Insight:    Good and Fair  Judgment:   Good  Impulse Control:  Good   Risk Assessment: Danger to Self:  No Self-injurious Behavior: No Danger to Others: No Duty to Warn:no Physical Aggression / Violence:No  Access to Firearms a concern: No  Gang Involvement:No   Subjective: Patient today reporting anxiety, overthinking, and obsessiveness when anxious.  Interventions: Cognitive Behavioral Therapy and Solution-Oriented/Positive Psychology  Diagnosis:   ICD-10-CM   1. Generalized anxiety disorder  F41.1     Plan:  Patient not signing tx plan on computer screen due to COVID.  Treatment Goals: Goals may remain on tx plan as patient works to achieve her goals. Progress will be documented each session in the "Progress" section of Plan.  Long Term goal: Reduce overall level, frequency, and intensity of the anxiety so that daily functioning is not impaired.   Short Term goal: Verbalize an understanding of the role that fearful thinking plays in creating fears, excessive worry, and persistent anxiety symptoms . Strategy:  Explore cognitive messages that  mediate anxiety response and retrain in adaptive cognitions.  Develop behavioral and cognitive strategies to reduce of eliminate the irrational anxiety. Help client develop healthy self-talk as a means of handling the anxiety.  (Progressing) Patient in today reporting anxiety, overthinking, and some obsessiveness when she is more anxious. On anxiety scale 1-10, she rates herself as a "5 or 6". Still some anxiety about the virus but trying not to overthink and we reviewed / practiced previously discussed strategies to decrease overthinking, especially being able to catch herself and interrupt the overthinking.  Processed more of her irrational/anxious thoughts and she feels she is some better. Needing to practice "unplugging" more from "bad or disturbing news" on TV or online and that feeds her anxiety, but pulling away is very difficult for her.  Still encouraging her and offering suggestions/strategies that can help.  Sometimes she feels she may try to change too much at once so her goal between sessions now is to work specifically on unplugging more from the above-mentioned "bad, disturbing news" and her phone.  To follow up on this at next session.  Goal review and progress/challenges.  Next appt within 2-3 weeks.   Mathis Fare, LCSW

## 2020-04-03 ENCOUNTER — Ambulatory Visit (INDEPENDENT_AMBULATORY_CARE_PROVIDER_SITE_OTHER): Payer: 59 | Admitting: Psychiatry

## 2020-04-03 ENCOUNTER — Other Ambulatory Visit: Payer: Self-pay

## 2020-04-03 DIAGNOSIS — F411 Generalized anxiety disorder: Secondary | ICD-10-CM

## 2020-04-03 NOTE — Progress Notes (Signed)
      [facility] Counselor/Therapist Progress Note  Patient ID: Kim Clements, MRN: [id number],    Date: 04/03/2020  Time Spent: 60 minutes  11:00am to 12:00noon  Treatment Type: Individual Therapy  Reported Symptoms: anxiety, stressed, frustration  Mental Status Exam:  Appearance:   Casual     Behavior:  Appropriate and Sharing  Motor:  Normal  Speech/Language:   Normal Rate  Affect:  anxious  Mood:  anxious  Thought process:  goal directed  Thought content:    WNL  Sensory/Perceptual disturbances:    WNL  Orientation:  oriented to person, place, time/date, situation, day of week, month of year and year  Attention:  Fair  Concentration:  Fair  Memory:  some forgetfulness worse under stress  Fund of knowledge:   Good  Insight:    Good and Fair  Judgment:   Good  Impulse Control:  Good and Fair   Risk Assessment: Danger to Self:  No Self-injurious Behavior: No Danger to Others: No Duty to Warn:no Physical Aggression / Violence:No  Access to Firearms a concern: No  Gang Involvement:No   Subjective: Patient today reporting anxeity, frustrations, and stressed.  Interventions: Cognitive Behavioral Therapy and Solution-Oriented/Positive Psychology  Diagnosis:   ICD-10-CM   1. Generalized anxiety disorder  F41.1      Plan: Patient not signing tx plan on computer screen due to COVID.  Treatment Goals: Goals may remain on tx plan as patient works to achieve her goals. Progress will be documented each session in the "Progress" section of Plan.  Long Term goal: Reduce overall level, frequency, and intensity of the anxiety so that daily functioning is not impaired.   Short Term goal: Verbalize an understanding of the role that fearful thinking plays in creating fears, excessive worry, and persistent anxiety symptoms . Strategy:  Explore cognitive messages that mediate anxiety response and retrain in adaptive cognitions.  Develop behavioral and  cognitive strategies to reduce of eliminate the irrational anxiety. Help client develop healthy self-talk as a means of handling the anxiety.  (Progressing) Patient in today and anxiety is escalated, a lot of which is due to the increase again in Covid #s. Today puts herself on 1-10 anxiety scale as a "9".  Discussed her irrational fears and challenged them, working to encourage her to replace them with more rational, reality-based, and empowering thoughts that do not support anxiety nor irrational fears. We worked together on this process using some of her frequent irrational thoughts, as patient has difficulty alone following through on this, but did improve after working on several examples.  Also focused on decreasing her overthinking on negative/fearful things or situations, and instead, being able to balance her thoughts to include more positives. Still needing to unplug more from her devices and not stay so connected to "the news online and on TV", which she finds it difficult to step back from, even though it feeds her anxiety and fearfulness.  Did agree to try harder on this and let family (in a helpful way) prompt her some, as it is very much a habit for patient. Will follow up on this next session.  Goal review and progress//challenges noted with patient.   Next appt within 2-3 weeks.    Mathis Fare, LCSW

## 2020-04-14 ENCOUNTER — Ambulatory Visit (INDEPENDENT_AMBULATORY_CARE_PROVIDER_SITE_OTHER): Payer: 59 | Admitting: Family Medicine

## 2020-04-14 ENCOUNTER — Encounter: Payer: Self-pay | Admitting: Family Medicine

## 2020-04-14 ENCOUNTER — Other Ambulatory Visit: Payer: Self-pay

## 2020-04-14 VITALS — BP 110/72 | HR 73 | Temp 98.0°F | Ht 61.5 in | Wt 174.0 lb

## 2020-04-14 DIAGNOSIS — R1032 Left lower quadrant pain: Secondary | ICD-10-CM

## 2020-04-14 DIAGNOSIS — R1084 Generalized abdominal pain: Secondary | ICD-10-CM

## 2020-04-14 DIAGNOSIS — R109 Unspecified abdominal pain: Secondary | ICD-10-CM

## 2020-04-14 MED ORDER — CIPROFLOXACIN HCL 500 MG PO TABS: 500.0000 mg | ORAL_TABLET | Freq: Two times a day (BID) | ORAL | 0 refills | Status: AC

## 2020-04-14 MED ORDER — METRONIDAZOLE 500 MG PO TABS: 500.0000 mg | ORAL_TABLET | Freq: Three times a day (TID) | ORAL | 0 refills | Status: AC

## 2020-04-14 NOTE — Patient Instructions (Addendum)
Stat CT scan- sit tight in here while we get this scheduled.  Team will likely need to give you contrast from our office.   I sent in antibiotics for potential diverticulitis. Lets try to hold off until we get scan results but I certainly understand if you take it if worsening symptoms.    Diverticulitis  Diverticulitis is when small pockets in your large intestine (colon) get infected or swollen. This causes stomach pain and watery poop (diarrhea). These pouches are called diverticula. They form in people who have a condition called diverticulosis. Follow these instructions at home: Medicines  Take over-the-counter and prescription medicines only as told by your doctor. These include: ? Antibiotics. ? Pain medicines. ? Fiber pills. ? Probiotics. ? Stool softeners.  Do not drive or use heavy machinery while taking prescription pain medicine.  If you were prescribed an antibiotic, take it as told. Do not stop taking it even if you feel better. General instructions   Follow a diet as told by your doctor.  When you feel better, your doctor may tell you to change your diet. You may need to eat a lot of fiber. Fiber makes it easier to poop (have bowel movements). Healthy foods with fiber include: ? Berries. ? Beans. ? Lentils. ? Green vegetables.  Exercise 3 or more times a week. Aim for 30 minutes each time. Exercise enough to sweat and make your heart beat faster.  Keep all follow-up visits as told. This is important. You may need to have an exam of the large intestine. This is called a colonoscopy. Contact a doctor if:  Your pain does not get better.  You have a hard time eating or drinking.  You are not pooping like normal. Get help right away if:  Your pain gets worse.  Your problems do not get better.  Your problems get worse very fast.  You have a fever.  You throw up (vomit) more than one time.  You have poop that  is: ? Bloody. ? Black. ? Tarry. Summary  Diverticulitis is when small pockets in your large intestine (colon) get infected or swollen.  Take medicines only as told by your doctor.  Follow a diet as told by your doctor. This information is not intended to replace advice given to you by your health care provider. Make sure you discuss any questions you have with your health care provider. Document Revised: 08/05/2017 Document Reviewed: 09/09/2016 Elsevier Patient Education  2020 [employer].

## 2020-04-14 NOTE — Progress Notes (Signed)
Phone [phone] In person visit   Subjective:   Kim Clements is a 54 y.o. year old very pleasant female patient who presents for/with See problem oriented charting Chief Complaint  Patient presents with  . Follow-up  . diverticulitis   This visit occurred during the SARS-CoV-2 public health emergency.  Safety protocols were in place, including screening questions prior to the visit, additional usage of staff PPE, and extensive cleaning of exam room while observing appropriate contact time as indicated for disinfecting solutions.   Past Medical History-  Patient Active Problem List   Diagnosis Date Noted  . At high risk for breast cancer 07/02/2019  . Allergic rhinitis due to pollen 12/27/2018  . Allergic rhinitis due to animal (cat) (dog) hair and dander 12/27/2018  . Chronic pain of left ankle 07/03/2017  . GAD (generalized anxiety disorder) 12/10/2016  . Obesity (BMI 30.0-34.9) 12/10/2016  . Depression, recurrent (HCC) 12/10/2016  . IBS (irritable bowel syndrome), followed by [facility]   . HLD (hyperlipidemia)   . GERD (gastroesophageal reflux disease)   . Asthma   . Arthritis     Medications- reviewed and updated Current Outpatient Medications  Medication Sig Dispense Refill  . albuterol (PROVENTIL HFA;VENTOLIN HFA) 108 (90 Base) MCG/ACT inhaler Inhale into the lungs every 6 (six) hours as needed for wheezing or shortness of breath.    . AMBULATORY NON FORMULARY MEDICATION Allergy Shots 2 shots per week    . amitriptyline (ELAVIL) 10 MG tablet TAKE 1 TABLET BY MOUTH EVERY DAY AT NIGHT 90 tablet 1  . bismuth subsalicylate (PEPTO BISMOL) 262 MG/15ML suspension Take 30 mLs by mouth as needed.    . calcium carbonate (TUMS - DOSED IN MG ELEMENTAL CALCIUM) 500 MG chewable tablet Chew 1 tablet by mouth daily.    . Cholecalciferol (VITAMIN D3) 25 MCG (1000 UT) CAPS Take 1 capsule by mouth daily.    . clidinium-chlordiazePOXIDE (LIBRAX) 5-2.5 MG capsule TAKE 1  CAPSULE BY MOUTH 3 TIMES A DAY 1/2 HOUR BEFORE MEALS 90 capsule 2  . colestipol (COLESTID) 1 g tablet TAKE 1-2 TABLETS (GREENSTONE BRAND) BY MOUTH DAILY. 180 tablet 0  . Eluxadoline (VIBERZI) 75 MG TABS Take 1 tablet by mouth 2 (two) times daily. 180 tablet 0  . fexofenadine (ALLEGRA) 180 MG tablet Take 180 mg by mouth daily.    . hyoscyamine (LEVSIN SL) 0.125 MG SL tablet Place 1 tablet (0.125 mg total) under the tongue every 6 (six) hours as needed. 30 tablet 2  . Lactase (LACTAID PO) Take 1 tablet by mouth as needed.    . Loperamide HCl (IMODIUM A-D PO) Take by mouth as needed.    . sertraline (ZOLOFT) 100 MG tablet TAKE 1 TABLET BY MOUTH EVERY DAY 90 tablet 1  . Simethicone (GAS-X PO) Take by mouth as needed.     No current facility-administered medications for this visit.     Objective:  BP 110/72   Pulse 73   Temp 98 F (36.7 C)   Ht 5' 1.5" (1.562 m)   Wt 174 lb (78.9 kg)   SpO2 97%   BMI 32.34 kg/m  Gen: NAD, resting comfortably in chair- able to get on table without difficulty CV: RRR no murmurs rubs or gallops Lungs: CTAB no crackles, wheeze, rhonchi Abdomen: soft/patient very tender to palpation LLQ- otherwise normal exam without pain/nondistended/normal bowel sounds. No rebound or guarding.  Ext: no edema Skin: warm, dry    Assessment and Plan  Diverticulitis S: Patient with  diverticulitis several years. Previously seen by Dr. Abigail Miyamoto for many years. Went on trip right afterwards but luckily did ok with oral antibiotics. She had left lower quadrant pain at that time. I reviewed Ct scan from that time and "1. There is no evidence for acute diverticulitis.  2. A small, nonspecific wisp of fat stranding is identified  adjacent to the distal descending colon. The adjacent colon  appears to be normal without wall thickening or evidence of  diverticulitis. This is of questionable clinical significance.  Potential differential considerations include very mild epiploic    appendagitis. "  Has had a few episodes since that time and she did well with antibiotics again. She is not sure if she has been reimaged since that time. Diverticulosis noted on 2017 colonoscopy with Dr. Lavon Paganini.   Mother was in ER with mother helping her with diverticulitis. Then was having some stomach upset and thought could be related to IBS. 2 days ago noted abdominal pain in lower left side. Worsened yesterday. Pain perhaps 2/10 and having some bloating.Feels like prior episodes. No nausea yet. Has had some mild indigestion.  Bowel movements are normal-no blood in the stool was noted. A/P: I suspect diverticulitis-patient with history of sigmoid diverticulosis on last colonoscopy.  With that being said-I do not have any records on file confirming diverticulitis in the past-see imaging results above which were listed as appendagitis.  I think we should proceed forward with urgent imaging to confirm the diagnosis before starting antibiotics.  Patient has multiple allergies to medications including hives with penicillin so Augmentin is not a great choice.  Therefore would need ciprofloxacin/metronidazole and due to risks of fluoroquinolones-would like to avoid if we can.  I did send in antibiotics in case positive for diverticulitis or if she has worsening symptoms while we await imaging As she does have a history of IBS but her symptoms tend to be more diarrhea related-typically does not have pain like this with IBS and has not had relief with bowel movements  Thankfully she has follow-up with GI in 9 days-I will be a great check in point   Recommended follow up: Depends on CT scan-sooner if worsening symptoms Future Appointments  Date Time Provider Department Center  04/17/2020 11:00 AM Mathis Fare, LCSW [facility] None  04/23/2020  3:30 PM Zehr, Princella Pellegrini, PA-C [facility]  05/01/2020  3:00 PM Mathis Fare, LCSW [facility] None  05/21/2020 11:00 AM Mathis Fare, LCSW [facility] None  06/04/2020  11:00 AM Mathis Fare, LCSW [facility] None  06/18/2020 11:00 AM Mathis Fare, LCSW [facility] None  07/03/2020 11:00 AM Mathis Fare, LCSW [facility] None    Lab/Order associations:   ICD-10-CM   1. LLQ pain  R10.32 CT Abdomen Pelvis W Contrast    CANCELED: CT Abdomen Pelvis W Contrast    CANCELED: CT Abdomen Pelvis W Contrast    CANCELED: CT Abdomen Pelvis W Contrast  2. Generalized abdominal pain  R10.84 CT Abdomen Pelvis W Contrast    CANCELED: CT Abdomen Pelvis W Contrast  3. Abdominal pain, unspecified abdominal location  R10.9 CT Abdomen Pelvis W Contrast    Meds ordered this encounter  Medications  . ciprofloxacin (CIPRO) 500 MG tablet    Sig: Take 1 tablet (500 mg total) by mouth 2 (two) times daily for 10 days.    Dispense:  20 tablet    Refill:  0  . metroNIDAZOLE (FLAGYL) 500 MG tablet    Sig: Take 1 tablet (500 mg total) by mouth 3 (  three) times daily for 10 days.    Dispense:  30 tablet    Refill:  0   Time Spent: 30 minutes of total time (2:20 PM- 2:43 PM, 3:07-3:14 PM) was spent on the date of the encounter performing the following actions: chart review prior to seeing the patient, obtaining history, performing a medically necessary exam, counseling on the treatment plan, placing orders, and documenting in our EHR.    Return precautions advised.  Tana Conch, MD

## 2020-04-15 ENCOUNTER — Encounter: Payer: Self-pay | Admitting: Family Medicine

## 2020-04-15 ENCOUNTER — Ambulatory Visit (HOSPITAL_COMMUNITY)
Admission: RE | Admit: 2020-04-15 | Discharge: 2020-04-15 | Disposition: A | Discharge status: Home / Self Care | Payer: 59 | Source: Ambulatory Visit | Attending: Family Medicine | Admitting: Family Medicine

## 2020-04-15 ENCOUNTER — Telehealth: Payer: Self-pay | Admitting: Family Medicine

## 2020-04-15 DIAGNOSIS — R109 Unspecified abdominal pain: Secondary | ICD-10-CM | POA: Diagnosis not present

## 2020-04-15 MED ORDER — SODIUM CHLORIDE (PF) 0.9 % IJ SOLN
INTRAMUSCULAR | Status: AC
  Filled 2020-04-15: qty 50

## 2020-04-15 MED ORDER — IOHEXOL 300 MG/ML  SOLN
100.0000 mL | Freq: Once | INTRAMUSCULAR | Status: AC | PRN
  Administered 2020-04-15: 100 mL via INTRAVENOUS

## 2020-04-15 NOTE — Telephone Encounter (Signed)
banda from [facility] requested an order for the CT abdomen pelvis with contrast faxed to [phone]

## 2020-04-16 NOTE — Telephone Encounter (Signed)
I did order this-has already been completed.  I assume they do not need fax at this point.  Team if you to call back-please provide fax

## 2020-04-16 NOTE — Telephone Encounter (Signed)
Not sure if you ordered???  aw

## 2020-04-17 ENCOUNTER — Other Ambulatory Visit: Payer: Self-pay

## 2020-04-17 ENCOUNTER — Ambulatory Visit (INDEPENDENT_AMBULATORY_CARE_PROVIDER_SITE_OTHER): Payer: 59 | Admitting: Psychiatry

## 2020-04-17 DIAGNOSIS — F411 Generalized anxiety disorder: Secondary | ICD-10-CM | POA: Diagnosis not present

## 2020-04-17 NOTE — Progress Notes (Signed)
[facility] Counselor/Therapist Progress Note  Patient ID: Kim Clements, MRN: [id number],    Date: 04/17/2020  Time Spent: 60 minutes   11:00am to 12:00noon  Treatment Type: Individual Therapy  Reported Symptoms: anxiety, frustration  Mental Status Exam:  Appearance:   Casual     Behavior:  Appropriate and Sharing  Motor:  Normal  Speech/Language:   Clear and Coherent  Affect:  anxiety  Mood:  anxious  Thought process:  goal directed  Thought content:    some obsessiveness and spiraling  Sensory/Perceptual disturbances:    WNL  Orientation:  oriented to person, place, time/date, situation, day of week, month of year and year  Attention:  Good  Concentration:  Fair  Memory:  some forgetfulness  Fund of knowledge:   Good  Insight:    Good and Fair  Judgment:   Good and Fair  Impulse Control:  Good and Fair   Risk Assessment: Danger to Self:  No Self-injurious Behavior: No Danger to Others: No Duty to Warn:no Physical Aggression / Violence:No  Access to Firearms a concern: No  Gang Involvement:No   Subjective: Patient today reports some diverticulitis issues and anxiety is higher as Covid surging again, and other stressors within family.  Interventions: Cognitive Behavioral Therapy, Solution-Oriented/Positive Psychology and Ego-Supportive  Diagnosis:   ICD-10-CM   1. Generalized anxiety disorder  F41.1      Plan: Patient not signing tx plan on computer screen due to COVID.  Treatment Goals: Goals may remain on tx plan as patient works to achieve her goals. Progress will be documented each session in the "Progress" section of Plan.  Long Term goal: Reduce overall level, frequency, and intensity of the anxiety so that daily functioning is not impaired.   Short Term goal: Verbalize an understanding of the role that fearful thinking plays in creating fears, excessive worry, and persistent anxiety symptoms . Strategy:  Explore cognitive  messages that mediate anxiety response and retrain in adaptive cognitions.  Develop behavioral and cognitive strategies to reduce of eliminate the irrational anxiety. Help client develop healthy self-talk as a means of handling the anxiety.  (Progressing) Patient in today reporting some increased anxiety, especially with Covid surge and family/health situations. Processed a lot of her very anxious thoughts and feelings about the increased Covid surge and tried to help guide her in looking at the anxious thoughts and how each of them were excessively anxious and not very likely to really happen.  She hesitantly agreed that might be correct but very hard to let go of her anxious thoughts.  The more we talked about them however, she did not hold on quite as tightly to them.  Is making some progress in accepting that chronic anxiety can over time sometimes, lead to other health issues including other emotional problems and cognitive issues.  She is aware of this and does show some concern.  Admits that she does sometimes stay home as that is "more of my safe place and she states this is been true during the COVID crisis.  It does seem that her anxious thoughts are often escalated by continuing to absorb a lot of on line news that tends to be disturbing and creates more fearfulness.  Have urged her today to decrease her online time to a suggested amount of twice a day, no more than 10 minutes each time.  She agrees to try that and also engage herself in some other activities such as reading, exercising, chores, and  talking with family and friends.  Encouraged more improved self-care including healthy nutrition, exercise, positive self talk, better sleep patterns, decrease overthinking and "what if" thinking, and looking for more positives than negatives.  Goal review progress/challenges noted with patient.  Next appt within 3 weeks.   Mathis Fare, LCSW

## 2020-04-23 ENCOUNTER — Ambulatory Visit (INDEPENDENT_AMBULATORY_CARE_PROVIDER_SITE_OTHER): Payer: 59 | Admitting: Gastroenterology

## 2020-04-23 ENCOUNTER — Encounter: Payer: Self-pay | Admitting: Gastroenterology

## 2020-04-23 VITALS — BP 110/62 | HR 87 | Ht 61.0 in | Wt 173.1 lb

## 2020-04-23 DIAGNOSIS — K580 Irritable bowel syndrome with diarrhea: Secondary | ICD-10-CM | POA: Diagnosis not present

## 2020-04-23 MED ORDER — VIBERZI 75 MG PO TABS: 1.0000 | ORAL_TABLET | Freq: Two times a day (BID) | ORAL | 3 refills | Status: DC

## 2020-04-23 MED ORDER — COLESTIPOL HCL 1 G PO TABS: 1.0000 g | ORAL_TABLET | Freq: Two times a day (BID) | ORAL | 3 refills | Status: DC

## 2020-04-23 MED ORDER — HYOSCYAMINE SULFATE 0.125 MG SL SUBL: 0.1250 mg | SUBLINGUAL_TABLET | Freq: Four times a day (QID) | SUBLINGUAL | 3 refills | Status: DC | PRN

## 2020-04-23 NOTE — Patient Instructions (Addendum)
If you are age 55 or older, your body mass index should be between 23-30. Your Body mass index is 32.71 kg/m. If this is out of the aforementioned range listed, please consider follow up with your Primary Care Provider.  If you are age 44 or younger, your body mass index should be between 19-25. Your Body mass index is 32.71 kg/m. If this is out of the aformentioned range listed, please consider follow up with your Primary Care Provider.   We have sent the following medications to your pharmacy for you to pick up at your convenience: Viberzi, Levsin, Colestid.

## 2020-04-23 NOTE — Progress Notes (Signed)
04/23/2020 Tarin Johndrow [id number] 14-Dec-1965   HISTORY OF PRESENT ILLNESS: This is a pleasant 54 year old female who is a patient of Dr. Elana Alm who has diagnosis of IBS-D, GERD, diverticulosis, and anxiety/depression.  She is here today for routine follow-up and refills on her IBS medications.  She is currently on Colestid 1 to 2 tablets daily, hyoscyamine every 6 hours as needed which she has not really taken much, and Viberzi 75 mg twice daily.  She is also likely lactose intolerant and overall has avoided dairy for quite some time now.  Uses Lactaid pills when needed.  Overall she has been doing well.  She recently had an episode of left lower quadrant abdominal pain that felt like her classic diverticulitis that she has experienced in the past.  She had a CT scan of the abdomen and pelvis with contrast that showed diverticulosis, but no diverticulitis.  Her PCP had prescribed Cipro and Flagyl, but she did not start the medications and since the CT scan was negative she did not end up taking them at all.  The pain completely resolved within a couple of days.  Otherwise she has been about status quo in the setting of IBS D with anxiety and depression in the middle of the pandemic.  Her last colonoscopy was in June 2017 with findings of sigmoid diverticulosis and internal hemorrhoids, no polyps.   Past Medical History:  Diagnosis Date  . Anal fissure   . Anxiety   . Arthritis   . Asthma   . Colon polyps   . Diverticulosis   . GERD (gastroesophageal reflux disease)   . HLD (hyperlipidemia)   . IBS (irritable bowel syndrome)    Past Surgical History:  Procedure Laterality Date  . ABDOMINAL HYSTERECTOMY  2007  . LAPAROSCOPY  1988   Laser. Indication: Endometriosis.  . TONSILLECTOMY  1971    reports that she has never smoked. She has never used smokeless tobacco. She reports that she does not drink alcohol and does not use drugs. family history includes Breast  cancer in her paternal aunt and paternal grandmother; Colon polyps in her father and mother; Heart disease in her maternal grandfather, maternal grandmother, mother, and paternal grandfather; Osteoarthritis in her mother; Prostate cancer in her paternal grandfather; Rheum arthritis in her mother; Stroke in her father. Allergies  Allergen Reactions  . Erythromycin Rash  . Penicillins Hives  . Sulfa Antibiotics Rash      Outpatient Encounter Medications as of 04/23/2020  Medication Sig  . albuterol (PROVENTIL HFA;VENTOLIN HFA) 108 (90 Base) MCG/ACT inhaler Inhale into the lungs every 6 (six) hours as needed for wheezing or shortness of breath.  . AMBULATORY NON FORMULARY MEDICATION Allergy Shots 2 shots per week  . amitriptyline (ELAVIL) 10 MG tablet TAKE 1 TABLET BY MOUTH EVERY DAY AT NIGHT  . bismuth subsalicylate (PEPTO BISMOL) 262 MG/15ML suspension Take 30 mLs by mouth as needed.  . calcium carbonate (TUMS - DOSED IN MG ELEMENTAL CALCIUM) 500 MG chewable tablet Chew 1 tablet by mouth daily.  . Cholecalciferol (VITAMIN D3) 25 MCG (1000 UT) CAPS Take 1 capsule by mouth daily.  . clidinium-chlordiazePOXIDE (LIBRAX) 5-2.5 MG capsule TAKE 1 CAPSULE BY MOUTH 3 TIMES A DAY 1/2 HOUR BEFORE MEALS  . colestipol (COLESTID) 1 g tablet TAKE 1-2 TABLETS (GREENSTONE BRAND) BY MOUTH DAILY.  Marland Kitchen Eluxadoline (VIBERZI) 75 MG TABS Take 1 tablet by mouth 2 (two) times daily.  . fexofenadine (ALLEGRA) 180 MG tablet Take 180 mg  by mouth daily.  . hyoscyamine (LEVSIN SL) 0.125 MG SL tablet Place 1 tablet (0.125 mg total) under the tongue every 6 (six) hours as needed.  . Lactase (LACTAID PO) Take 1 tablet by mouth as needed.  . Loperamide HCl (IMODIUM A-D PO) Take by mouth as needed.  . sertraline (ZOLOFT) 100 MG tablet TAKE 1 TABLET BY MOUTH EVERY DAY  . Simethicone (GAS-X PO) Take by mouth as needed.  . ciprofloxacin (CIPRO) 500 MG tablet Take 1 tablet (500 mg total) by mouth 2 (two) times daily for 10 days.  (Patient not taking: Reported on 04/23/2020)  . metroNIDAZOLE (FLAGYL) 500 MG tablet Take 1 tablet (500 mg total) by mouth 3 (three) times daily for 10 days. (Patient not taking: Reported on 04/23/2020)   No facility-administered encounter medications on file as of 04/23/2020.    REVIEW OF SYSTEMS  : All other systems reviewed and negative except where noted in the History of Present Illness.   PHYSICAL EXAM: BP 110/62 (BP Location: Right Arm, Patient Position: Sitting, Cuff Size: Normal)   Pulse 87   Ht 5\' 1"  (1.549 m)   Wt 173 lb 2 oz (78.5 kg)   BMI 32.71 kg/m  General: Well developed white female in no acute distress Head: Normocephalic and atraumatic Eyes:  Sclerae anicteric, conjunctiva pink. Ears: Normal auditory acuity Lungs: Clear throughout to auscultation; no increased WOB. Heart: Regular rate and rhythm; no M/R/G. Abdomen: Soft, non-distended.  BS present.  Non-tender. Musculoskeletal: Symmetrical with no gross deformities  Skin: No lesions on visible extremities Extremities: No edema  Neurological: Alert oriented x 4, grossly non-focal Psychological:  Alert and cooperative. Normal mood and affect  ASSESSMENT AND PLAN: #50 54 year old white female with history of IBS D has been on a regimen of the Viberzi, levsin, and Colestid.  Doing well overall.  Recently had abdominal pain in LL[place], but CT negative for diverticulitis and it resolved.    #2 question lactose intolerance:  Remains lactose free for the most part and uses lactaid pills prn.  #3 history of anxiety/depression  #4 diverticulosis   CC:  39, MD

## 2020-05-01 ENCOUNTER — Ambulatory Visit (INDEPENDENT_AMBULATORY_CARE_PROVIDER_SITE_OTHER): Payer: 59 | Admitting: Psychiatry

## 2020-05-01 ENCOUNTER — Other Ambulatory Visit: Payer: Self-pay

## 2020-05-01 DIAGNOSIS — F411 Generalized anxiety disorder: Secondary | ICD-10-CM

## 2020-05-01 NOTE — Progress Notes (Signed)
[facility] Counselor/Therapist Progress Note  Patient ID: Kim Clements, MRN: [id number],    Date: 05/01/2020  Time Spent: 60 minutes   3:00pm to 4:00pm  Treatment Type: Individual Therapy  Reported Symptoms: anxiety, some lack of motivation  Mental Status Exam:  Appearance:   Casual     Behavior:  Appropriate and Sharing  Motor:  Normal  Speech/Language:   Clear and Coherent  Affect:  anxious  Mood:  anxious and on-edge at times  Thought process:  goal directed  Thought content:    some obsessiveness and some runimation at times  Sensory/Perceptual disturbances:    WNL  Orientation:  oriented to person, place, time/date, situation, day of week, month of year and year  Attention:  Fair  Concentration:  Fair  Memory:  some forgetfulness  Fund of knowledge:   Good and Fair  Insight:    Good and Fair  Judgment:   Good  Impulse Control:  Good   Risk Assessment: Danger to Self:  No Self-injurious Behavior: No Danger to Others: No Duty to Warn:no Physical Aggression / Violence:No  Access to Firearms a concern: No  Gang Involvement:No   Subjective: Patient reports anxiety and feeling on-edge at times, with COVID . 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 Generalized anxiety disorder  F41.1      Plan: Patient not signing tx plan on computer screen due to COVID.  Treatment Goals: Goals may remain on tx plan as patient works to achieve her goals. Progress will be documented each session in the "Progress" section of Plan.  Long Term goal: Reduce overall level, frequency, and intensity of the anxiety so that daily functioning is not impaired.   Short Term goal: Verbalize an understanding of the role that fearful thinking plays in creating fears, excessive worry, and persistent anxiety symptoms . Strategy:  Explore cognitive  messages that mediate anxiety response and retrain in adaptive cognitions.  Develop behavioral and cognitive strategies to reduce of eliminate the irrational anxiety. Help client develop healthy self-talk as a means of handling the anxiety.  (Progressing) Patient today reports anxiety and feeling on-edge at times, mostly about Coviid surge and family concerns. Was quite anxious when she arrived today and processed a lot of her anxious thoughts Still struggling with cutting back on watching the news as she feels she "needs to be prepared." Discussed this today, including her feelings about needing to stay in close contact with the news, especially news about Covid and other disturbing or fear-inducing news. Later states she is going to try harder and we looked at what would feel like realistic goals for her and working more intentionally on her anxiety.  Did discuss in detail today about how continuous watching of news that creates a lot of fears and anxiety does not help her "be more prepared", but rather it depletes her emotional energy and keeps her on edge, as demonstrated by her behavior.  Patient acknowledges this to be true and commits to working harder between sessions on limiting her online time to watch news or read articles to twice per day but she did not give a timeframe.  We will see how she does over the next couple weeks and discuss a more specific timeframe in terms of strategies for reducing her anxiety. Encouraged patient and some positive self-care including some exercise, healthy nutrition, better sleep patterns, looking for more positives than  negatives, decrease her "what if" thinking and overthinking, and more positive self talk.  Goal review and progress/challenges noted with patient.  Next appt within 2 weeks.   Mathis Fare, LCSW

## 2020-05-02 NOTE — Progress Notes (Signed)
Reviewed and agree with documentation and assessment and plan. K. Veena Deya Bigos , MD   

## 2020-05-09 ENCOUNTER — Other Ambulatory Visit: Payer: Self-pay

## 2020-05-09 ENCOUNTER — Encounter: Payer: Self-pay | Admitting: Family Medicine

## 2020-05-09 ENCOUNTER — Ambulatory Visit: Payer: 59 | Admitting: Family Medicine

## 2020-05-09 VITALS — BP 114/76 | HR 83 | Temp 98.7°F | Ht 61.0 in | Wt 173.8 lb

## 2020-05-09 DIAGNOSIS — F411 Generalized anxiety disorder: Secondary | ICD-10-CM | POA: Diagnosis not present

## 2020-05-09 DIAGNOSIS — I700 Atherosclerosis of aorta: Secondary | ICD-10-CM | POA: Diagnosis not present

## 2020-05-09 MED ORDER — ROSUVASTATIN CALCIUM 10 MG PO TABS: 10.0000 mg | ORAL_TABLET | Freq: Every day | ORAL | 3 refills | Status: DC

## 2020-05-09 MED ORDER — SERTRALINE HCL 25 MG PO TABS: 25.0000 mg | ORAL_TABLET | Freq: Every day | ORAL | 1 refills | Status: DC

## 2020-05-09 NOTE — Patient Instructions (Addendum)
Exercise Recommend 150 minutes/week getting heart rate up where you can't talk.  Diet I like is mediterranean and plant based with some meat.   For anxiety -we are going to increase zoloft to 125mg /day. I have to send in a separate 25mg  pill with the 100mg .  -really try to increase exercise -continue counseling -let me know if any issues   For the cholesterol im going to send in a drug called crestor. With finding on imaging and family history I think it's worth starting this. Let me know if any side effects such as muscle cramping, dark urine, joint pain.   Want to see you back in 3 months to see how anxiety is going.  Hang in there! Dr. 

## 2020-05-09 NOTE — Progress Notes (Signed)
Patient: Kim Clements MRN: [id number] DOB: 02-Feb-1966 PCP: Orland Mustard, MD     Subjective:  Chief Complaint  Patient presents with  . Results    CT  . Anxiety  . Depression    HPI: The patient is a 54 y.o. female who presents today for Anxiety and Depression. She is also here to discuss CT results as well as follow up for depression and anxiety.   CT she had done 04/15/20 that had incidental finding aortic atherosclerosis. She does have a strong family history of CAD with MI in her mother. She has never smoked and does not have diabetes. She does not really exercise much.    Anxiety She is on zoloft 100mg  daily and is in counseling. She states her anxiety is worse during the pandemic and may be affecting others around her. She has a good supportive family. She is not exercising. Gets set off if she sees people not wearing a mask.   The 10-year ASCVD risk score DC Jr., et al., 2013) is: 1.8%   Review of Systems  Respiratory: Negative for chest tightness and shortness of breath.   Cardiovascular: Negative for chest pain and palpitations.  Gastrointestinal: Negative for abdominal pain, nausea and vomiting.  Neurological: Negative for tremors and numbness.    Allergies Patient is allergic to erythromycin, penicillins, and sulfa antibiotics.  Past Medical History Patient  has a past medical history of Anal fissure, Anxiety, Arthritis, Asthma, Colon polyps, Diverticulosis, GERD (gastroesophageal reflux disease), HLD (hyperlipidemia), and IBS (irritable bowel syndrome).  Surgical History Patient  has a past surgical history that includes Abdominal hysterectomy (2007); Tonsillectomy (1971); and laparoscopy (1988).  Family History Pateint's family history includes Breast cancer in her paternal aunt and paternal grandmother; Colon polyps in her father and mother; Heart disease in her maternal grandfather, maternal grandmother, mother, and paternal grandfather;  Osteoarthritis in her mother; Prostate cancer in her paternal grandfather; Rheum arthritis in her mother; Stroke in her father.  Social History Patient  reports that she has never smoked. She has never used smokeless tobacco. She reports that she does not drink alcohol and does not use drugs.    Objective: Vitals:   05/09/20 1336  BP: 114/76  Pulse: 83  Temp: 98.7 F (37.1 C)  TempSrc: Temporal  SpO2: 99%  Weight: 173 lb 12.8 oz (78.8 kg)  Height: 5\' 1"  (1.549 m)    Body mass index is 32.84 kg/m.  Physical Exam Vitals reviewed.  Constitutional:      Appearance: Normal appearance. She is obese.  HENT:     Head: Normocephalic and atraumatic.  Neck:     Vascular: No carotid bruit.  Cardiovascular:     Rate and Rhythm: Normal rate and regular rhythm.     Heart sounds: Normal heart sounds.  Pulmonary:     Effort: Pulmonary effort is normal.     Breath sounds: Normal breath sounds.  Abdominal:     General: Bowel sounds are normal.     Palpations: Abdomen is soft.  Musculoskeletal:     Cervical back: Normal range of motion and neck supple.  Lymphadenopathy:     Cervical: No cervical adenopathy.  Skin:    General: Skin is warm.     Capillary Refill: Capillary refill takes less than 2 seconds.  Neurological:     General: No focal deficit present.     Mental Status: She is alert and oriented to person, place, and time.  Psychiatric:  Mood and Affect: Mood normal.        Behavior: Behavior normal.      Office Visit from 05/09/2020 in [facility]  PHQ-9 Total Score 4     GAD 7 : Generalized Anxiety Score 05/09/2020 11/30/2019  Nervous, Anxious, on Edge 2 1  Control/stop worrying 3 0  Worry too much - different things 1 1  Trouble relaxing 0 1  Restless 0 0  Easily annoyed or irritable 0 0  Afraid - awful might happen 0 0  Total GAD 7 Score 6 3  Anxiety Difficulty - Somewhat difficult        Assessment/plan: 1. Aortic atherosclerosis  (HCC) Reviewed scan and discussed risks/options. Starting statin. Side effects discussed and she is to let me know if any issues.   2. GAD (generalized anxiety disorder) GAD7 score is still mild at 6, but it's affecting her life/activities. Pandemic is trigger. We are going to increase her dose of zoloft to 125mg  and see her back in 3 months. Already in counseling and really encouraged her to start exercising.     This visit occurred during the SARS-CoV-2 public health emergency.  Safety protocols were in place, including screening questions prior to the visit, additional usage of staff PPE, and extensive cleaning of exam room while observing appropriate contact time as indicated for disinfecting solutions.     Return in about 3 months (around 08/08/2020), or if symptoms worsen or fail to improve, for anxiety .   14/11/2019, MD [place] [facility]   05/09/2020

## 2020-05-21 ENCOUNTER — Ambulatory Visit: Payer: 59 | Admitting: Psychiatry

## 2020-05-21 ENCOUNTER — Ambulatory Visit (INDEPENDENT_AMBULATORY_CARE_PROVIDER_SITE_OTHER): Payer: 59 | Admitting: Psychiatry

## 2020-05-21 ENCOUNTER — Other Ambulatory Visit: Payer: Self-pay

## 2020-05-21 DIAGNOSIS — F411 Generalized anxiety disorder: Secondary | ICD-10-CM | POA: Diagnosis not present

## 2020-05-21 NOTE — Progress Notes (Signed)
[facility] Counselor/Therapist Progress Note  Patient ID: Kim Clements, MRN: [id number],    Date: 05/21/2020  Time Spent: 60 minutes   12:00noon to 1:00pm  Treatment Type: Individual Therapy  Reported Symptoms: anxiety, fears, anger, low motivation  Mental Status Exam:  Appearance:   Casual     Behavior:  Appropriate and Sharing  Motor:  Normal  Speech/Language:   Clear and Coherent  Affect:  anxious  Mood:  anxious  Thought process:  some tangentiality at times  Thought content:    some obsessiveness  Sensory/Perceptual disturbances:    WNL  Orientation:  oriented to person, place, time/date, situation, day of week, month of year and year  Attention:  Good  Concentration:  Good and Fair  Memory:  some forgetfulness worse with  stress  Fund of knowledge:   Good  Insight:    Good and Fair  Judgment:   Good and Fair  Impulse Control:  Good and Fair   Risk Assessment: Danger to Self:  No Self-injurious Behavior: No Danger to Others: No Duty to Warn:no Physical Aggression / Violence:No  Access to Firearms a concern: No  Gang Involvement:No   Subjective: Patient today reports frustration "lots of it" about "the whole Covid thing." Family concerns also reported.  Interventions: Cognitive Behavioral Therapy and Solution-Oriented/Positive Psychology  Diagnosis:   ICD-10-CM   1. Generalized anxiety disorder  F41.1     Plan: Patient not signing tx plan on computer screen due to COVID.  Treatment Goals: Goals may remain on tx plan as patient works to achieve her goals. Progress will be documented each session in the "Progress" section of Plan.  Long Term goal: Reduce overall level, frequency, and intensity of the anxiety so that daily functioning is not impaired.   Short Term goal: Verbalize an understanding of the role that fearful thinking plays in creating fears, excessive worry, and persistent anxiety symptoms . Strategy:  Explore  cognitive messages that mediate anxiety response and retrain in adaptive cognitions.  Develop behavioral and cognitive strategies to reduce of eliminate the irrational anxiety. Help client develop healthy self-talk as a means of handling the anxiety.  (Progressing) Patient in today reporting anxiety and it has escalated more recently with surge of Covid in our area. We focused on this in session today working with specific examples of her fears and anxieties that have strengthened more recently.  Patient finds it difficult to pull away from watching news online about Covid.  She gets obsessive at times with staying on "top of the latest data about the virus."  So far nobody in her family has been sick. Does see that it is not helping her to stay so focused on fears and anxieties and doesn't help her stay any more safe. Did discuss today the dangers of staying so overly focused on disturbing news and the negative effects it can have.  Mutually agreed on some specific activities for her to work on to be healthier:  1) some type of exercise daily such as walking even if she starts with a smaller time frame to get    started and work towards increasing the time 2) limit online or TV consumption of Covid news to once in a.m. for 15 minutes and once in p.m.      For 15 minutes 3) Focus more time on getting tasks completed at home and make note of what she             accomplishes  4) Be in contact with friends who are supportive, talk more about things not virus-related and not     Negative/stressful 5) Let her faith be a comfort. Have some quiet time daily to reflect and to pray (aloud) including     praying for herself.   6) Get outside of the house daily. Use journaling as a tool and make it positive vs negative. 7) Practice daily "looking for the positives vs negatives." 8) Practice staying in the present versus in the past nor jumping into the future and "worrying    about what might happen" 9)  Decrease overthinking.  Will follow up on these next visit.  Goal review and progress/challenges noted with patient.  Next appt within 3 weeks.   Mathis Fare, LCSW

## 2020-06-04 ENCOUNTER — Ambulatory Visit: Payer: 59 | Admitting: Psychiatry

## 2020-06-05 ENCOUNTER — Ambulatory Visit (INDEPENDENT_AMBULATORY_CARE_PROVIDER_SITE_OTHER): Payer: 59 | Admitting: Psychiatry

## 2020-06-05 ENCOUNTER — Other Ambulatory Visit: Payer: Self-pay

## 2020-06-05 DIAGNOSIS — F411 Generalized anxiety disorder: Secondary | ICD-10-CM

## 2020-06-05 NOTE — Progress Notes (Signed)
[facility] Counselor/Therapist Progress Note  Patient ID: Kim Clements, MRN: [id number],    Date: 06/05/2020  Time Spent:  60 minutes   1:00pm to 2:00pm  Treatment Type: Individual Therapy  Reported Symptoms: anxiety escalated re: Covid, general anxiety, irritability   Mental Status Exam:  Appearance:   Casual     Behavior:  Appropriate and Sharing  Motor:  Normal  Speech/Language:   Clear and Coherent  Affect:  anxious  Mood:  anxious and some irritability  Thought process:  normal  Thought content:    WNL and Obsessions (some obsessiveness)  Sensory/Perceptual disturbances:    WNL  Orientation:  oriented to person, place, time/date, situation, day of week, month of year and year  Attention:  Fair  Concentration:  Fair  Memory:  some forgetfulness worse with stress  Fund of knowledge:   Good  Insight:    Fair  Judgment:   Good and Fair  Impulse Control:  Good and Fair   Risk Assessment: Danger to Self:  No Self-injurious Behavior: No Danger to Others: No Duty to Warn:no Physical Aggression / Violence:No  Access to Firearms a concern: No  Gang Involvement:No   Subjective: Patient today reports escalated anxiety due to Covid increase recently and with kids. Leading to some family concerns.   Interventions: Cognitive Behavioral Therapy, Solution-Oriented/Positive Psychology and Ego-Supportive  Diagnosis:   ICD-10-CM   1. Generalized anxiety disorder  F41.1     Plan: Patient not signing tx plan on computer screen due to COVID.  Treatment Goals: Goals may remain on tx plan as patient works to achieve her goals. Progress will be documented each session in the "Progress" section of Plan.  Long Term goal: Reduce overall level, frequency, and intensity of the anxiety so that daily functioning is not impaired.   Short Term goal: Verbalize an understanding of the role that fearful thinking plays in creating fears, excessive worry, and  persistent anxiety symptoms . Strategy:  Explore cognitive messages that mediate anxiety response and retrain in adaptive cognitions.  Develop behavioral and cognitive strategies to reduce of eliminate the irrational anxiety. Help client develop healthy self-talk as a means of handling the anxiety.  (Progressing) Patient today reporting anxiety stronger re: Covid and is creating some family issues. Patient in today and we reviewed strategies from last session and patient is working some on them, some more than others, but this is still encouraging.  Her main concern today is the more recent COVID numbers and how that is impacting her and family and making certain decisions that they can agree upon and feels safe.  She states that she needs to discuss and get some clarity for herself and be able to move forward.  Patient has experienced more anxiety with COVID, especially when there have been surges in the number of cases.  This has impacted some of her family involvement in activities and patient is seeking help both to calm herself more and to feel like she is making good decisions and having family participate in a few more things and be a little more socially active and yet, practicing safe and healthy precautionary measures.  She vented a lot of her concerns in session today and we discussed each morning as well as some possible ways of being involved in some activities more but still practicing safe behaviors and using protective measures such as mask and social distancing.  Was much calmer by session and and also had her write down some  of the points made during session as she has stated before that this helps her to review between sessions and to be focused on strategies and behaviors that are proactive versus reactive, and that promote help versus excessive fear and anxiety.  Encouraged self-care including healthy nutrition, getting outside of the house every day, walking daily as able, being in  touch with supportive friends who do not feed her anxiety, staying in the present versus the past nor the future, work on decreasing her overthinking, letting her faith be a comfort for herself, intentionally looking for the positives versus negatives, limiting exposure to TV or online news that is distressing, and developing more positive self talk.  Goal review and progress/challenges noted with patient.  Next appt within 2-3 weeks.   Mathis Fare, LCSW

## 2020-06-13 ENCOUNTER — Telehealth: Payer: Self-pay

## 2020-06-13 NOTE — Telephone Encounter (Signed)
Pt states Dr. Artis Flock prescribed her Crestor. Pt did find out from pharmacist that there was lactose in the medicine. Pt asked if Dr. Artis Flock could prescribe an alternative that is lactose free. Please advise.

## 2020-06-16 NOTE — Telephone Encounter (Signed)
See below

## 2020-06-16 NOTE — Telephone Encounter (Signed)
Let her know I have no idea what has lactose in it. That must be part of the filler of the drug. Can pharmacist tell her if there is  a statin drug that is lactose free? That is very interesting..  Dr. Artis Flock

## 2020-06-16 NOTE — Telephone Encounter (Signed)
Lvm for pt to call the office back. 

## 2020-06-18 ENCOUNTER — Ambulatory Visit: Payer: 59 | Admitting: Psychiatry

## 2020-06-18 ENCOUNTER — Ambulatory Visit (INDEPENDENT_AMBULATORY_CARE_PROVIDER_SITE_OTHER): Payer: 59 | Admitting: Psychiatry

## 2020-06-18 DIAGNOSIS — F411 Generalized anxiety disorder: Secondary | ICD-10-CM | POA: Diagnosis not present

## 2020-06-18 NOTE — Progress Notes (Signed)
[facility] Counselor/Therapist Progress Note  Patient ID: Kim Clements, MRN: [id number],    Date: 06/18/2020  Time Spent: 60 minutes   12:00noon to 1:00pm  Treatment Type: Individual Therapy  Reported Symptoms: anxiety, frustration (re: Covid situations and family)   Mental Status Exam:  Appearance:   n/a  telehealth     Behavior:  Appropriate and Sharing  Motor:  Normal  Speech/Language:   Clear and Coherent  Affect:  n/a  telehealth  Mood:  anxious  Thought process:  goal directed  Thought content:    obsessiveness  Sensory/Perceptual disturbances:    WNL  Orientation:  oriented to person, place, time/date, situation, day of week, month of year and year  Attention:  Good  Concentration:  Fair  Memory:  forgetfulness at times and worse under stress  Fund of knowledge:   Good  Insight:    Good and Fair  Judgment:   Good and Fair  Impulse Control:  Good and Fair   Risk Assessment: Danger to Self:  No Self-injurious Behavior: No Danger to Others: No Duty to Warn:no Physical Aggression / Violence:No  Access to Firearms a concern: No  Gang Involvement:No   Subjective: Patient reports anxiety and frustration.  Frustration mostly about family and Covid related issues.  Interventions: Cognitive Behavioral Therapy and Solution-Oriented/Positive Psychology  Diagnosis:   ICD-10-CM   1. Generalized anxiety disorder  F41.1      Plan: Patient not signing tx plan on computer screen due to COVID.  Treatment Goals: Goals may remain on tx plan as patient works to achieve her goals. Progress will be documented each session in the "Progress" section of Plan.  Long Term goal: Reduce overall level, frequency, and intensity of the anxiety so that daily functioning is not impaired.   Short Term goal: Verbalize an understanding of the role that anxious and fearful thinking plays in creating fears, excessive worry, and persistent anxiety  symptoms. . Strategy:  Explore cognitive messages that mediate anxiety response and retrain in adaptive cognitions.  Develop behavioral and cognitive strategies to reduce of eliminate the irrational anxiety. Help client develop healthy self-talk as a means of handling the anxiety.  (Progressing) Patient today reporting anxiety and frustrations with some improvement noted most recently. Shared her progress and discussed ways of building upon it as she has a lengthy history of anxiety.  Does seem to be putting forth more effort and feeling some optimism on having a better couple weeks in terms of following through on homework between sessions re: being more mindful of her anxious thoughts and how to interrupt them and change them to more positive, realistic, and empowering thoughts and self-talk. States "I haven't done this as much as I should but have at least started doing it more past couple weeks."  Also worked on this and short term goal in tx plan above re: better understanding the role that anxious/fearful thinking plays in leading her to have more fears, excessive worry, and persistent anxiety symptoms.  She seems to have understood over these past couple weeks how consistently following up on recommended homework can really help her interrupt some of her persistent anxious/negative thoughts.  Also urged her to practice more consistently looking for what might go right versus wrong, staying in the present each day rather than going back to the past or jumping ahead to the future and worrying about "what is going to happen", and work on slowing her speech down when she is anxious which  has helped her be more calming on prior occasions.  Good work and discussion in session today and she responded well in ways that showed she felt more positive about herself which can be a motivating factor for her.  Encouraged patient to practice positive self-care through being more proactive versus reactive in her  goals, healthy nutrition, walking or some form of exercise, getting outside daily, limiting exposure to distressing news on TV or online, developing more positive self talk, letting her faith be a source of comfort and encouragement for herself, looking for more positives versus negatives, decreasing her overthinking, staying in the present versus the past or the future, and maintaining contact with healthy supportive people in her life.  Goal review and progress/challenges noted with patient.  Next appointment within 2 to 3 weeks.   Mathis Fare, LCSW

## 2020-06-19 NOTE — Telephone Encounter (Signed)
I called patient & relayed Dr. Blair Heys message. Pt will talk with pharmacist & let us know if there is a lactose free alternative could be prescribed.

## 2020-07-03 ENCOUNTER — Ambulatory Visit: Payer: 59 | Admitting: Psychiatry

## 2020-07-14 ENCOUNTER — Other Ambulatory Visit: Payer: Self-pay

## 2020-07-14 ENCOUNTER — Ambulatory Visit (INDEPENDENT_AMBULATORY_CARE_PROVIDER_SITE_OTHER): Payer: 59 | Admitting: Psychiatry

## 2020-07-14 DIAGNOSIS — F411 Generalized anxiety disorder: Secondary | ICD-10-CM | POA: Diagnosis not present

## 2020-07-14 NOTE — Progress Notes (Signed)
[facility] Counselor/Therapist Progress Note  Patient ID: Kim Clements, MRN: [id number],    Date: 07/14/2020  Time Spent: 50 minutes   3:10pm to 4:00pm  Treatment Type: Individual Therapy  Reported Symptoms: anxiety  Mental Status Exam:  Appearance:   Casual     Behavior:  Appropriate, Sharing and Motivated  Motor:  Normal  Speech/Language:   Clear and Coherent  Affect:  anxious  Mood:  anxious  Thought process:  goal directed  Thought content:    some obsessiveness  Sensory/Perceptual disturbances:    WNL  Orientation:  oriented to person, place, time/date, situation, day of week, month of year and year  Attention:  Good  Concentration:  Good  Memory:  some forgetting and worse with stress  Fund of knowledge:   Good and some forgetting but some better  Insight:    Good and Fair  Judgment:   Good and Fair  Impulse Control:  Good   Risk Assessment: Danger to Self:  No Self-injurious Behavior: No Danger to Others: No Duty to Warn:no Physical Aggression / Violence:No  Access to Firearms a concern: No  Gang Involvement:No   Subjective: Patient today reports anxiety. Has been on vacation since last appt.  Better follow through on goal-directed behavior. Trying to be more positive.   Interventions: Cognitive Behavioral Therapy and Solution-Oriented/Positive Psychology  Diagnosis:   ICD-10-CM   1. Generalized anxiety disorder  F41.1     Plan: Patient not signing tx plan on computer screen due to COVID.  Treatment Goals: Goals may remain on tx plan as patient works to achieve her goals. Progress will be documented each session in the "Progress" section of Plan.  Long Term goal: Reduce overall level, frequency, and intensity of the anxiety so that daily functioning is not impaired.   Short Term goal: Verbalize an understanding of the role that anxious and fearful thinking plays in creating fears, excessive worry, and persistent anxiety  symptoms. . Strategy:  Explore cognitive messages that mediate anxiety response and retrain in adaptive cognitions.  Develop behavioral and cognitive strategies to reduce of eliminate the irrational anxiety. Help client develop healthy self-talk as a means of handling the anxiety.  (Progressing) Patient in today reporting anxiety and frustrations. Working on slowing down her talking, which is a challenge for her.  Communication issues within family and worked on this today with patient,  not "assuming the worst", and reducing her overthinking.  Seems more committed about getting better sleep habits and states she" wants to start going to bed earlier and closer to midnight" rather than between 1 and 2:00. Still struggling with anxious thoughts, and is to work more with her short term goal and strategy (both in tx plan above) to interrupt more quickly her anxious thoughts, challenge them and replace with more positive, reality-based, and empowering thoughts that do not support anxiety.  Good effort and patient showing some optimism in her efforts and in her homework and tracking progress.  In addition to working on her anxious/"worrying" thoughts and replacing them, patient is also to focus more on being able to calm herself when she feels more anxious including using deep breathing techniques, positive self talk, getting involved in certain tasks that can help, contradicting self negating when it happens, exercise, and maintaining contact with people that are supportive of her. To continue her limiting exposure to distressful news on TV or online as this seems to have helped some in recent weeks.  Encouraged her to  believe more in herself and her ability to make changes, and also in letting her faith be a source of emotional comfort and part of her healing.    Goal review and progress/challenges noted with patient.  Next appointment within 2 to 3 weeks.   Mathis Fare,  LCSW

## 2020-08-04 ENCOUNTER — Ambulatory Visit (INDEPENDENT_AMBULATORY_CARE_PROVIDER_SITE_OTHER): Payer: 59 | Admitting: Psychiatry

## 2020-08-04 ENCOUNTER — Other Ambulatory Visit: Payer: Self-pay

## 2020-08-04 DIAGNOSIS — F411 Generalized anxiety disorder: Secondary | ICD-10-CM | POA: Diagnosis not present

## 2020-08-04 NOTE — Progress Notes (Signed)
[facility] Counselor/Therapist Progress Note  Patient ID: Kim Clements, MRN: [id number],    Date: 08/04/2020  Time Spent: 60 minutes   3:00pm to 4:00pm  Treatment Type: Individual Therapy  Reported Symptoms: anxiety, stressed, fearful  Mental Status Exam:  Appearance:   Casual     Behavior:  Appropriate, Sharing and Motivated  Motor:  Normal  Speech/Language:   Clear and Coherent  Affect:  anxious  Mood:  anxious  Thought process:  goal directed  Thought content:    some obsessiveness  Sensory/Perceptual disturbances:    WNL  Orientation:  oriented to person, place, time/date, situation, day of week, month of year and year  Attention:  Good  Concentration:  Good  Memory:  some forgetfulness  Fund of knowledge:   Good  Insight:    Good and Fair  Judgment:   Good and Fair  Impulse Control:  Good and Fair   Risk Assessment: Danger to Self:  No Self-injurious Behavior: No Danger to Others: No Duty to Warn:no Physical Aggression / Violence:No  Access to Firearms a concern: No  Gang Involvement:No   Subjective: Patient today reports anxiety, some more stressed, and fearful at times.  Feels she is making some progress but recently have had more challenges with her anxiety.  Has watched some more news that she was watching and that does feed her anxiety, especially re: Covid variants.   Interventions: Cognitive Behavioral Therapy and Solution-Oriented/Positive Psychology  Diagnosis:   ICD-10-CM   1. Generalized anxiety disorder  F41.1     Plan: Patient not signing tx plan on computer screen due to COVID.  Treatment Goals: Goals may remain on tx plan as patient works to achieve her goals. Progress will be documented each session in the "Progress" section of Plan.  Long Term goal: Reduce overall level, frequency, and intensity of the anxiety so that daily functioning is not impaired.   Short Term goal: Verbalize an understanding of the role  that anxious andfearful thinking plays in creating fears, excessive worry, and persistent anxiety symptoms. . Strategy:  Explore cognitive messages that mediate anxiety response and retrain in adaptive cognitions.  Develop behavioral and cognitive strategies to reduce of eliminate the irrational anxiety. Help client develop healthy self-talk as a means of handling the anxiety.  (Progressing)  Patient today reports anxiety as "main symptom", and focused today on: Anxiety- difficulty avoiding things that feed her anxiety such as TV and online news. Communication within family has reportedly improved some but still needs some work. Has made some efforts to have better sleep habits, but difficulty being consistent. Reviewed anxiety reduction techniques. Also, encouraged her to resume tracking her progress again as that seemed to be a motivator.  Positives/Goal-directed behaviors to work on / keep working on: Trying to more seriously limit her exposure to TV and [employer].  States she's working on slowing down her speech some and stopping at sentence end.  To work harder on not assuming the worst in situations, and looking for positives more than negatives. Reduce overthinking. Letting her faith be a support to her and her emotional struggles and emotional healing. Working with her anxious thoughts to interrupt/replace them with more realistic and positive thoughts. Working to improve her self talk and make it more positive and affirming. Be aware and contradict her self-negating. Improve health status with more conscientious nutrition and exercise. Keep frequent contact with people who are supportive of her and encouraged her. Continue to encourage patient to  believe more in herself and her ability to make and keep changes.  Goal review, along with info above, and progress/challenges noted with patient.  Next appointment within 2 to 3 weeks.   Mathis Fare,  LCSW

## 2020-08-07 ENCOUNTER — Other Ambulatory Visit: Payer: Self-pay

## 2020-08-07 ENCOUNTER — Ambulatory Visit: Payer: 59 | Admitting: Family Medicine

## 2020-08-07 ENCOUNTER — Encounter: Payer: Self-pay | Admitting: Family Medicine

## 2020-08-07 DIAGNOSIS — F4323 Adjustment disorder with mixed anxiety and depressed mood: Secondary | ICD-10-CM | POA: Diagnosis not present

## 2020-08-07 DIAGNOSIS — I700 Atherosclerosis of aorta: Secondary | ICD-10-CM

## 2020-08-07 MED ORDER — SERTRALINE HCL 50 MG PO TABS: 50.0000 mg | ORAL_TABLET | Freq: Every day | ORAL | 1 refills | Status: DC

## 2020-08-07 MED ORDER — SERTRALINE HCL 100 MG PO TABS: 100.0000 mg | ORAL_TABLET | Freq: Every day | ORAL | 1 refills | Status: DC

## 2020-08-07 NOTE — Patient Instructions (Addendum)
-  ashwagandha 300mg  about 30 min. Before bed.  -sleep hygiene No Tv/screen time 30 minutes before bed.  Bed for sex and sleep only No caffeine after 2pm and no naps after 2pm Try to reset your daily routine. Go to bed at 10pm and then get up around 8am.  Exercise will help a lot too.    Lets increase your zoloft to 150mg . email me if you feel like it is messing with your belly.   Lets follow up in 3 months or sooner if needed.  Start exercising!  Merry christmas! Dr. 

## 2020-08-07 NOTE — Progress Notes (Signed)
Patient: Kim Clements MRN: [id number] DOB: 1966-05-21 PCP: Orland Mustard, MD     Subjective:  Chief Complaint  Patient presents with  . Anxiety    HPI: The patient is a 54 y.o. female who presents today for Anxiety. We increased her zoloft to 125mg /day. She has been in counseling and the pandemic has been a big trigger for her. Also encouraged her to start exercising. She states she can't really tell a difference with the increased dosage. Her anxiety waxes and wanes. Also having a hard time with sleep. Her sleep pattern is odd where she stays up late and then sleeps in. She does wake up during middle of the night. Not following sleep hygiene.   I started her on crestor for aortic arthrosclerosis. She is having no issues doing this.   Review of Systems  Respiratory: Negative for shortness of breath and wheezing.   Cardiovascular: Negative for chest pain and palpitations.  Neurological: Negative for dizziness and headaches.    Allergies Patient is allergic to erythromycin, penicillins, and sulfa antibiotics.  Past Medical History Patient  has a past medical history of Anal fissure, Anxiety, Arthritis, Asthma, Colon polyps, Diverticulosis, GERD (gastroesophageal reflux disease), HLD (hyperlipidemia), and IBS (irritable bowel syndrome).  Surgical History Patient  has a past surgical history that includes Abdominal hysterectomy (2007); Tonsillectomy (1971); and laparoscopy (1988).  Family History Pateint's family history includes Breast cancer in her paternal aunt and paternal grandmother; Colon polyps in her father and mother; Heart disease in her maternal grandfather, maternal grandmother, mother, and paternal grandfather; Osteoarthritis in her mother; Prostate cancer in her paternal grandfather; Rheum arthritis in her mother; Stroke in her father.  Social History Patient  reports that she has never smoked. She has never used smokeless tobacco. She reports that she  does not drink alcohol and does not use drugs.    Objective: Vitals:   08/07/20 1408  BP: 113/75  Pulse: 86  Temp: 98.2 F (36.8 C)  TempSrc: Temporal  SpO2: 99%  Weight: 175 lb (79.4 kg)  Height: 5\' 1"  (1.549 m)    Body mass index is 33.07 kg/m.  Physical Exam Vitals reviewed.  Constitutional:      Appearance: Normal appearance. She is obese.  HENT:     Head: Normocephalic and atraumatic.  Cardiovascular:     Rate and Rhythm: Normal rate and regular rhythm.     Heart sounds: Normal heart sounds.  Pulmonary:     Effort: Pulmonary effort is normal.     Breath sounds: Normal breath sounds.  Abdominal:     General: Bowel sounds are normal.     Palpations: Abdomen is soft.  Neurological:     General: No focal deficit present.     Mental Status: She is alert and oriented to person, place, and time.  Psychiatric:        Mood and Affect: Mood normal.        Behavior: Behavior normal.    GAD 7 : Generalized Anxiety Score 08/07/2020 05/09/2020 11/30/2019  Nervous, Anxious, on Edge 3 2 1   Control/stop worrying 2 3 0  Worry too much - different things 1 1 1   Trouble relaxing 0 0 1  Restless 0 0 0  Easily annoyed or irritable 0 0 0  Afraid - awful might happen 0 0 0  Total GAD 7 Score 6 6 3   Anxiety Difficulty Somewhat difficult - Somewhat difficult        Assessment/plan: 1. Aortic atherosclerosis (HCC) Tolerating statin  well. Repeat labs at f/u.   2. Adjustment disorder with mixed anxiety and depressed mood gad7 score still mild and no improvement. Discussed options for treatment. We are going to increase her zoloft to 150mg /day. Continue counseling and really encouraged exercise. If no improvement consider augmentation with buspar.  - sertraline (ZOLOFT) 100 MG tablet; Take 1 tablet (100 mg total) by mouth daily.  Dispense: 90 tablet; Refill: 1  Trial of sleep hygiene and ashwagandha for sleep.   This visit occurred during the SARS-CoV-2 public health emergency.   Safety protocols were in place, including screening questions prior to the visit, additional usage of staff PPE, and extensive cleaning of exam room while observing appropriate contact time as indicated for disinfecting solutions.     Return in about 3 months (around 11/05/2020) for anxiety/sleep .   01/05/2021, MD [facility]   08/08/2020

## 2020-08-21 ENCOUNTER — Ambulatory Visit (INDEPENDENT_AMBULATORY_CARE_PROVIDER_SITE_OTHER): Payer: 59 | Admitting: Psychiatry

## 2020-08-21 ENCOUNTER — Other Ambulatory Visit: Payer: Self-pay

## 2020-08-21 DIAGNOSIS — F411 Generalized anxiety disorder: Secondary | ICD-10-CM

## 2020-08-21 NOTE — Progress Notes (Signed)
[facility] Counselor/Therapist Progress Note  Patient ID: Kim Clements, MRN: [id number],    Date: 08/21/2020  Time Spent: 60 minutes    2:00pm to 3:00pm  Treatment Type: Individual Therapy  Reported Symptoms: anxiety, worries  Mental Status Exam:  Appearance:   Casual     Behavior:  Appropriate and Sharing  Motor:  Normal  Speech/Language:   Negative  Affect:  anxiety, worries  Mood:  anxious  Thought process:  some tangentiality  Thought content:    some obsessiveness  Sensory/Perceptual disturbances:    WNL  Orientation:  oriented to person, place, time/date, situation, day of week, month of year and year  Attention:  Good  Concentration:  Fair  Memory:  some forgetfulness  Fund of knowledge:   Good  Insight:    Good and Fair  Judgment:   Good and Fair  Impulse Control:  Good and Fair   Risk Assessment: Danger to Self:  No Self-injurious Behavior: No Danger to Others: No Duty to Warn:no Physical Aggression / Violence:No  Access to Firearms a concern: No  Gang Involvement:No   Subjective: Patient today reports anxiety and worries regarding personal and family concerns.   Interventions: Cognitive Behavioral Therapy, Solution-Oriented/Positive Psychology and Ego-Supportive  Diagnosis:   ICD-10-CM   1. Generalized anxiety disorder  F41.1     Plan: Patient not signing tx plan on computer screen due to COVID.  Treatment Goals: Goals may remain on tx plan as patient works to achieve her goals. Progress will be documented each session in the "Progress" section of Plan.  Long Term goal: Reduce overall level, frequency, and intensity of the anxiety so that daily functioning is not impaired.   Short Term goal: Verbalize an understanding of the role that anxious andfearful thinking plays in creating fears, excessive worry, and persistent anxiety symptoms. . Strategy:  Explore cognitive messages that mediate anxiety response and retrain  in adaptive cognitions.  Develop behavioral and cognitive strategies to reduce of eliminate the irrational anxiety. Help client develop healthy self-talk as a means of handling the anxiety.  (Progressing)  Patient reporting anxiety as her main symptom and focus today was on: Anxiety re: increasing Covid numbers with newer varant.  Lacking motivation for doing tasks at home  Lacking motivation for exercise Guilt feelings in some family interactions Still needing to limit time on cell phone Needing to make better sleep a priority Has made some progress in saying no to things that feed her anxiety such as excessive cell phone use and online news. Plan made to get outside and walk at least twice weekly for starting.  To track her progress on this. Reminded of anxiety reduction techniques as discussed in earlier sessions.   Positives/goal-directed behaviors patient is to continue Has made some progress in limiting online news and TV.  *To also work on having time away from her phone. Overthinking has reduced some and needs to continue working on this. Continue working on slowing down her speech, stopping at ending of sentences. Maintain contact with people who are supportive of her and encourage her with healthy habits Do not assume the worst in situations.  Intentionally look for more positives than negatives each day. Use her faith as a source of support in her emotional struggles and creating new habits that are healthier. Stop self negating. Believe more in herself and the ability to make changes that can be lasting. Stay on medications that doctors have recommended she take to prevent the development  of cardiac issues. Make self talk more affirming and positive. Interrupt anxious thoughts and replace with more positive and reality based thoughts. Make decisions that support improving her health status through more conscientious nutritional choices and exercise  Spent some time  discussing motivation for her goal-directed behaviors as well as patient documenting progress or lack of progress between sessions.  Goal review and progress/challenges noted with patient.  Next appointment within 2 weeks.   Mathis Fare, LCSW

## 2020-08-24 IMAGING — CT CT ABD-PELV W/ CM
2 of 5 series · 16 of 46 positions shown, 18 images · IV contrast (APPLIED)
Comparison: 05/04/2004

CLINICAL DATA: Left lower quadrant pain, diarrhea worsening since
04/12/2020.

EXAM:
CT ABDOMEN AND PELVIS WITH CONTRAST
TECHNIQUE: Multidetector CT imaging of the abdomen and pelvis was performed
using the standard protocol following bolus administration of
intravenous contrast.
CONTRAST:  100mL OMNIPAQUE IOHEXOL 300 MG/ML  SOLN

[Series 2: axial st · axial · 0.90mm/px · z∈[-833,-453]mm · 13 of 88 slices shown, 15 images]
[im 6/88  soft-tissue]
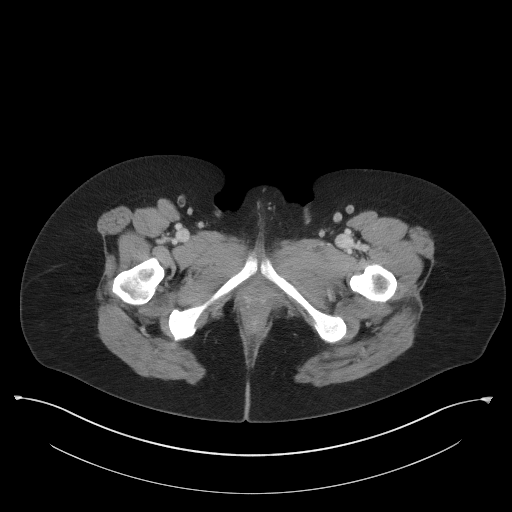
[im 6/88  bone]
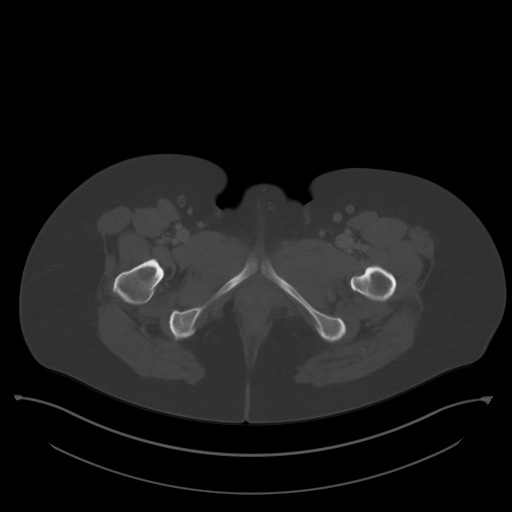
[im 11/88  soft-tissue]
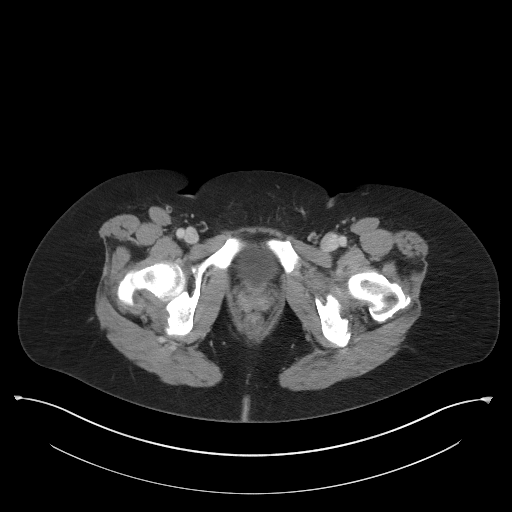
[im 17/88  soft-tissue]
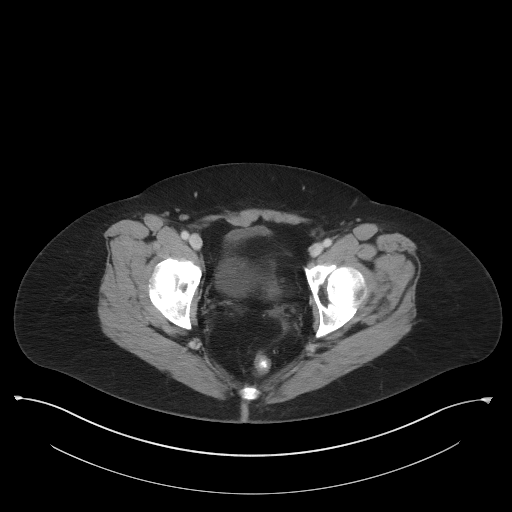
[im 28/88  soft-tissue]
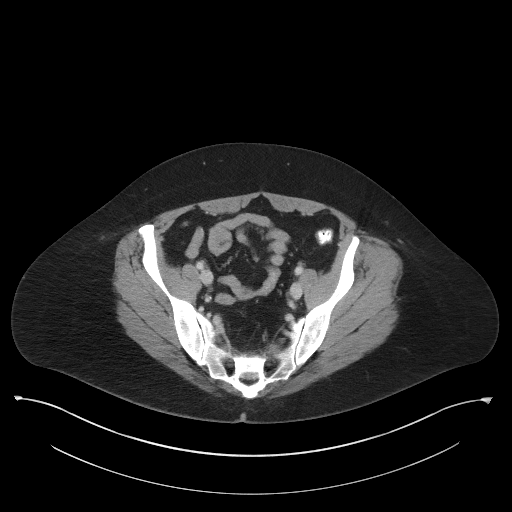
[im 33/88  soft-tissue]
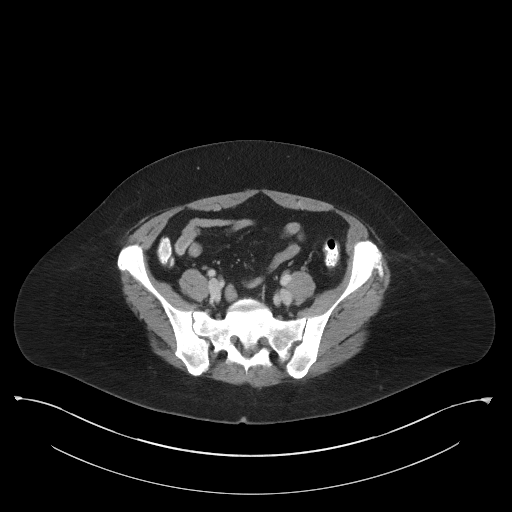
[im 39/88  soft-tissue]
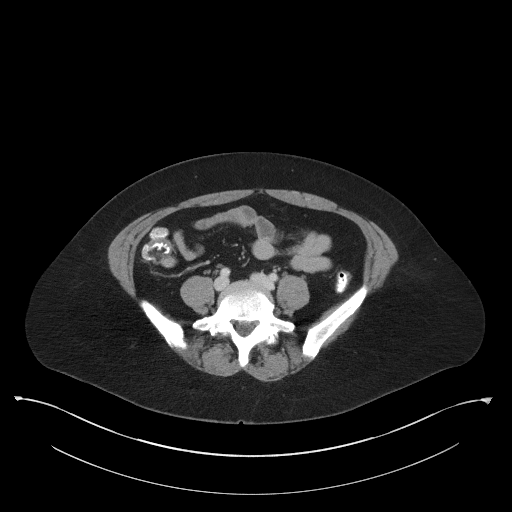
[im 44/88  soft-tissue]
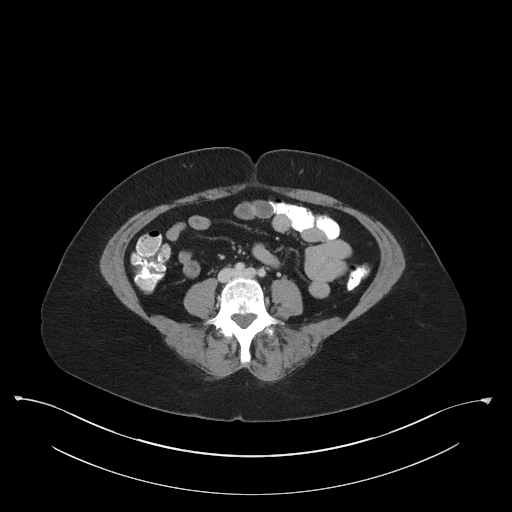
[im 49/88  soft-tissue]
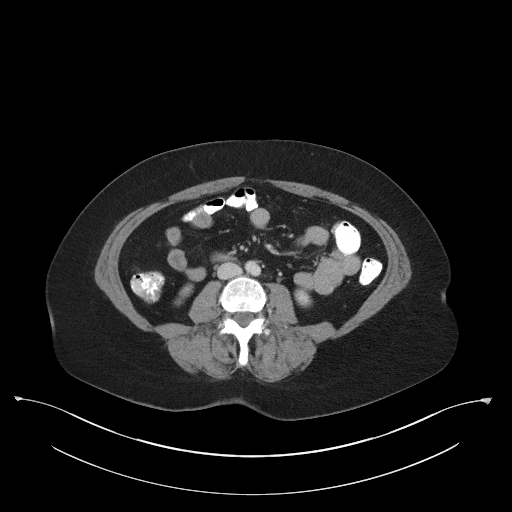
[im 55/88  soft-tissue]
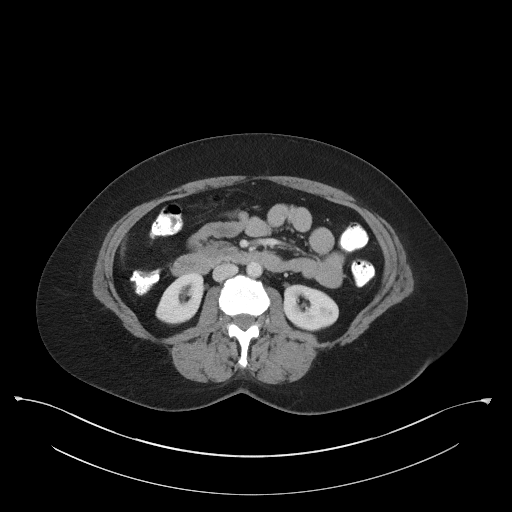
[im 55/88  bone]
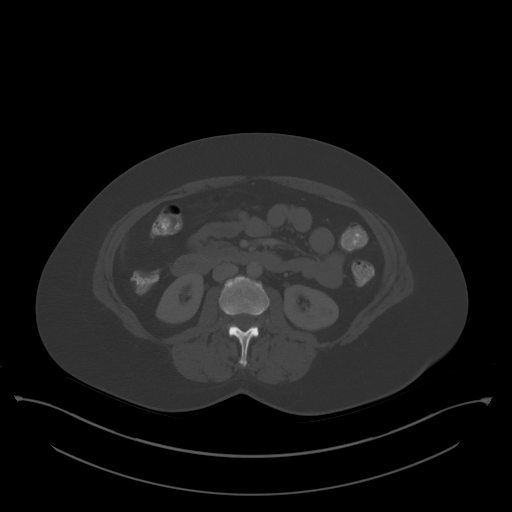
[im 60/88  soft-tissue]
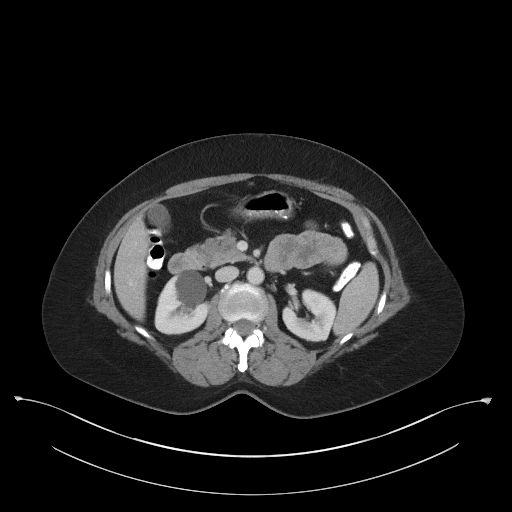
[im 71/88  soft-tissue]
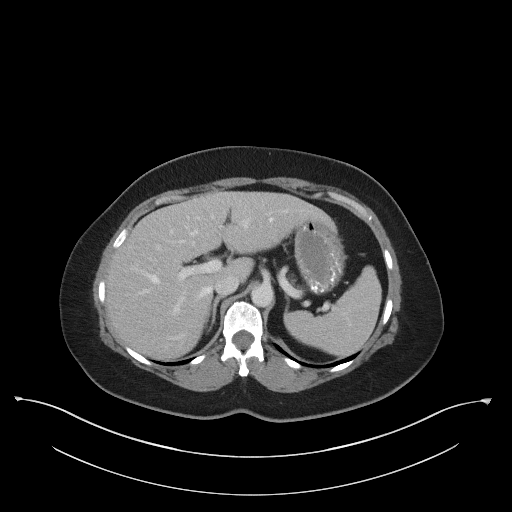
[im 77/88  soft-tissue]
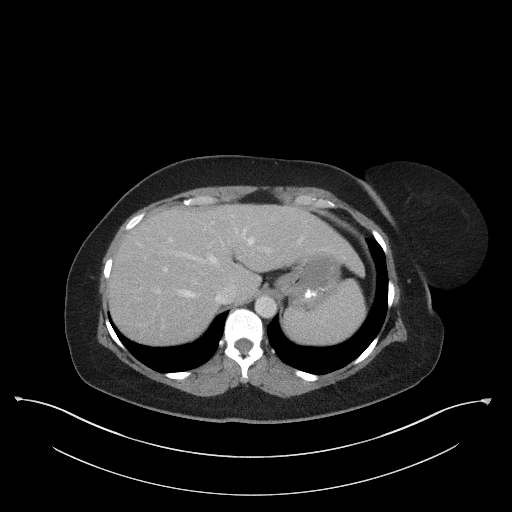
[im 82/88  soft-tissue]
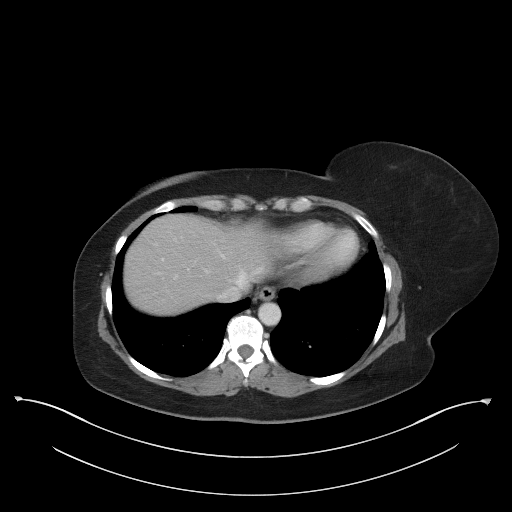

[Series 4: coronal st · coronal · 0.68mm/px · 3 of 88 slices shown]
[im 30/88  soft-tissue]
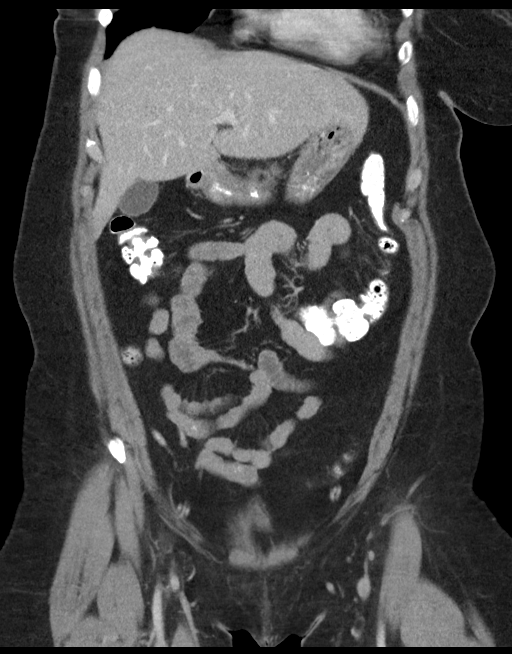
[im 39/88  soft-tissue]
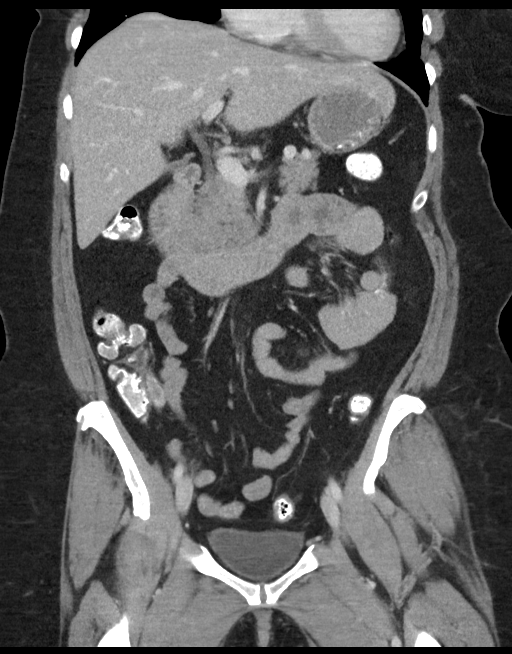
[im 49/88  soft-tissue]
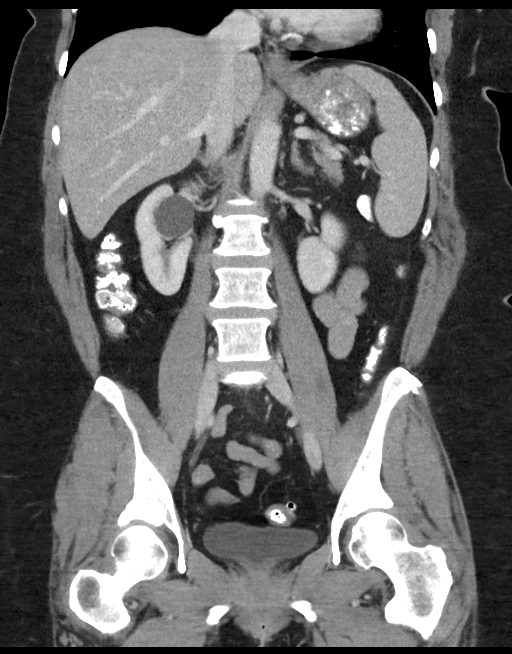

[16 of 46 positions shown; findings below may reference images not displayed]

FINDINGS: Lower chest: No acute abnormality.

Hepatobiliary: No focal liver abnormality is seen. No gallstones,
gallbladder wall thickening, or biliary dilatation.

Pancreas: Unremarkable. No pancreatic ductal dilatation or
surrounding inflammatory changes.

Spleen: Normal in size without focal abnormality.

Adrenals/Urinary Tract: Normal adrenal glands. No urolithiasis or
obstructive uropathy. 2.6 x 3.2 cm hypodense, fluid attenuating
right renal mass most consistent with a cyst.

Stomach/Bowel: Stomach is within normal limits. Appendix appears
normal. No evidence of bowel wall thickening, distention, or
inflammatory changes. Diverticulosis without evidence of
diverticulitis.

Vascular/Lymphatic: Normal caliber abdominal aorta with mild
atherosclerosis. No lymphadenopathy.

Reproductive: Status post hysterectomy. No adnexal masses.

Other: No abdominal wall hernia or abnormality. No abdominopelvic
ascites.

Musculoskeletal: No acute osseous abnormality. No aggressive osseous
lesion. Bilateral facet arthropathy at L4-5 and L5-S1.
IMPRESSION: 1. No acute abdominal or pelvic pathology.
2. Diverticulosis without evidence of diverticulitis.
3. Aortic Atherosclerosis ([id number]).

## 2020-08-28 ENCOUNTER — Telehealth: Payer: Self-pay

## 2020-08-28 NOTE — Telephone Encounter (Signed)
Prior Authorization has been started for Librax.

## 2020-09-02 NOTE — Telephone Encounter (Signed)
Patient's Librax has been denied. She has to try and fail formulary alternative dicyclomine.

## 2020-09-04 MED ORDER — DICYCLOMINE HCL 10 MG PO CAPS: 10.0000 mg | ORAL_CAPSULE | Freq: Three times a day (TID) | ORAL | 4 refills | Status: DC | PRN

## 2020-09-04 NOTE — Telephone Encounter (Signed)
Ok to send RX for bentyl 10 mg po TID prn IBS/ abdominal  cramping /diarrhea  # 90 /4

## 2020-09-04 NOTE — Telephone Encounter (Signed)
Patient has been contacted about the denial of her Librax and that we will be changing it to dicyclomine. We discussed that if her symptoms worsen to call to see if we can try to get approved.

## 2020-09-16 ENCOUNTER — Ambulatory Visit (INDEPENDENT_AMBULATORY_CARE_PROVIDER_SITE_OTHER): Payer: 59 | Admitting: Psychiatry

## 2020-09-16 DIAGNOSIS — F411 Generalized anxiety disorder: Secondary | ICD-10-CM

## 2020-09-16 NOTE — Progress Notes (Signed)
[facility] Counselor/Therapist Progress Note  Patient ID: Kim Clements, MRN: [id number],    Date: 09/16/2020  Time Spent: 60 minutes     2:55pm to 3:55pm   Virtual Visit Note via MyChart Connected with patient by a video enabled telemedicine/telehealth application or telephone, with their informed consent, and verified patient privacy and that I am speaking with the correct person using two identifiers. I discussed the limitations, risks, security and privacy concerns of performing psychotherapy and management service by telephone and the availability of in person appointments. I also discussed with the patient that there may be a patient responsible charge related to this service. The patient expressed understanding and agreed to proceed. I discussed the treatment planning with the patient. The patient was provided an opportunity to ask questions and all were answered. The patient agreed with the plan and demonstrated an understanding of the instructions. The patient was advised to call  our office if  symptoms worsen or feel they are in a crisis state and need immediate contact.   Therapist Location: [facility] Patient Location: home   Treatment Type: Individual Therapy  Reported Symptoms: anxiety    Mental Status Exam:  Appearance:   Casual     Behavior:  Appropriate, Sharing and Motivated  Motor:  Normal  Speech/Language:   Clear and Coherent  Affect:  anxious  Mood:  anxious  Thought process:  goal directed  Thought content:    WNL  Sensory/Perceptual disturbances:    WNL  Orientation:  oriented to person, place, time/date, situation, day of week, month of year and year  Attention:  Good  Concentration:  Good and Fair  Memory:  WNL  Fund of knowledge:   Good  Insight:    Good and Fair  Judgment:   Good and Fair  Impulse Control:  Good   Risk Assessment: Danger to Self:  No Self-injurious Behavior: No Danger to Others: No Duty to  Warn:no Physical Aggression / Violence:No  Access to Firearms a concern: No  Gang Involvement:No   Subjective: Today reports anxiety "but I do feel more stressed and anxious recently". Frustrated and gets angry at others not taking precautions with Covid.  Interventions: Cognitive Behavioral Therapy and Solution-Oriented/Positive Psychology  Diagnosis:   ICD-10-CM   1. Generalized anxiety disorder  F41.1     Plan: Patient not signing tx plan on computer screen due to COVID.  Treatment Goals: Goals may remain on tx plan as patient works to achieve her goals. Progress will be documented each session in the "Progress" section of Plan.  Long Term goal: Reduce overall level, frequency, and intensity of the anxiety so that daily functioning is not impaired.   Short Term goal: Verbalize an understanding of the role that anxious andfearful thinking plays in creating fears, excessive worry, and persistent anxiety symptoms. . Strategy:  Explore cognitive messages that mediate anxiety response and retrain in adaptive cognitions.  Develop behavioral and cognitive strategies to reduce of eliminate the irrational anxiety. Help client develop healthy self-talk as a means of handling the anxiety.  (Progressing) Anxiety has spiked some due mostly to more recent Covid surge. Patient states she is trying not to overly obsess about the Covid details but "I do check the news twice a day sometimes more and that can help and hurt, helps to know info but it can also be scary."  Is also trying to stay involved in other activities with family, watching movies, playing games inside, staying in touch  with relatives, and" trying to structure things so we're not in crowds due to Covid."  Needing to be more proactive versus reactive to Covid fears and realize more that all the precautions she is taking is helping protect her and family so she doesn't "need to worry" so much.  See below for more specific steps  and strategies patient is working on especially in regards to goals and strategy of her treatment goal plan above.  Patient today reporting anxiety as her main symptom and focus of session today was on: Increase motivation--willing to try something different by using "get up now< or count" method. Work to limit time on cell phone. Exercise/walk x 3 weekly. Need better sleep routine and this needs to be a priority. Reviewed anxiety reduction techniques as discussed in earlier sessions. Needing to limit phone and online news that feeds her anxiety.  Positives/goal-directed behaviors patient is to continue working on between sessions: To continue working on limiting of a news and TV as it feeds her anxiety. Take at least 3 self on breaks throughout the day each day. Let her faith be a source of support in her emotional struggles. Stop self negating. Continue work on her overthinking and overanalyzing. Intentionally slow down her speech especially when she gets anxious, stop at sentence end. Believe more in herself and the ability to make changes that may be difficult but lasting. Avoid assuming the worst in situations.  Look for more positives daily. Stay in contact with others who are supportive of her and her need to have healthier habits. Practice positive self talk. Interrupt anxious thoughts and replace with more reality based and empowering thoughts. Remain on medications as prescribed regarding preventing the development of cardiac issues. Work to make healthier decisions that support improved overall health physical and emotional.   Goal review and progress/challenges noted with patient.  Next appointment within 3 weeks.   Mathis Fare, LCSW

## 2020-10-08 ENCOUNTER — Ambulatory Visit (INDEPENDENT_AMBULATORY_CARE_PROVIDER_SITE_OTHER): Payer: 59 | Admitting: Psychiatry

## 2020-10-08 DIAGNOSIS — F411 Generalized anxiety disorder: Secondary | ICD-10-CM

## 2020-10-08 NOTE — Progress Notes (Signed)
[facility] Counselor/Therapist Progress Note  Patient ID: Kim Clements, MRN: [id number],    Date: 10/08/2020  Time Spent: 60 minutes    3:00pm to 4:00pm  Virtual Visit Note via Copywriter, advertising with patient by a video enabled telemedicine/telehealth application or telephone, with their informed consent, and verified patient privacy and that I am speaking with the correct person using two identifiers. I discussed the limitations, risks, security and privacy concerns of performing psychotherapy and management service by telephone and the availability of in person appointments. I also discussed with the patient that there may be a patient responsible charge related to this service. The patient expressed understanding and agreed to proceed. I discussed the treatment planning with the patient. The patient was provided an opportunity to ask questions and all were answered. The patient agreed with the plan and demonstrated an understanding of the instructions. The patient was advised to call  our office if  symptoms worsen or feel they are in a crisis state and need immediate contact.   Therapist Location: [facility] Patient Location: home  Treatment Type: Individual Therapy  Reported Symptoms: anxiety, worried about her sleep pattern but has not been successful in following through with strategies to make it healthier.  Mental Status Exam:  Appearance:   Neat     Behavior:  Appropriate, Sharing and Motivated  Motor:  Normal  Speech/Language:   Clear and Coherent  Affect:  anxious  Mood:  anxious  Thought process:  goal directed  Thought content:    some ruminating  Sensory/Perceptual disturbances:    WNL  Orientation:  oriented to person, place, time/date, situation, day of week, month of year and year  Attention:  Good  Concentration:  Good and Fair  Memory:  some occasional forgetfulness  Fund of knowledge:   Good  Insight:    Good and Fair   Judgment:   Good  Impulse Control:  Good and Fair   Risk Assessment: Danger to Self:  No Self-injurious Behavior: No Danger to Others: No Duty to Warn:no Physical Aggression / Violence:No  Access to Firearms a concern: No  Gang Involvement:No   Subjective: Patient  In today reporting anxiety as main symptom.   Interventions: Cognitive Behavioral Therapy, Solution-Oriented/Positive Psychology and Ego-Supportive  Diagnosis:   ICD-10-CM   1. Generalized anxiety disorder  F41.1     Plan: Patient not signing tx plan on computer screen due to COVID.  Treatment Goals: Goals may remain on tx plan as patient works to achieve her goals. Progress will be documented each session in the "Progress" section of Plan.  Long Term goal: Reduce overall level, frequency, and intensity of the anxiety so that daily functioning is not impaired.   Short Term goal: Verbalize an understanding of the role that anxious andfearful thinking plays in creating fears, excessive worry, and persistent anxiety symptoms. . Strategy:  Explore cognitive messages that mediate anxiety response and retrain in adaptive cognitions.  Develop behavioral and cognitive strategies to reduce of eliminate the irrational anxiety. Help client develop healthy self-talk as a means of handling the anxiety.  (Progressing) Patient today reporting anxiety as main symptom and is mostly related family and Covid. Is more committed at this point of working on her sleep issues.  Have encouraged this before with her, but she states she is now motivated and her plan is to go to bed 1/2 hr earlier, hoping to progress and then bump her bedtime up even more til she gets  to 12:00MN/12:30am. States she really wants to stick with it as she knows it would be healthier.  Also talked at length about her continued fears about COVID.  Her fears are not as bad as they have been however they are still pretty significant and I have encouraged her  not following it so much on the line and in the news as she can get pretty stuck on it and be difficult to move forward.  A lot of "what if's" and feeling that the more she follows information on it the better prepared she will be however it spills over into her quality of life in being able to be outside the home and yet still be safe by wearing mask etc.  Discussed this in more detail today with her as well as how heightened anxiety over time is able to negatively impact our social/emotional functioning and potentially our physical health.  Encouraged some healthy distance from overly focusing on COVID information, activities within the family, practicing looking for positives versus negatives, working on her plan to have better sleep schedule which will be a challenge for her, letting her faith be a source of support in her emotional struggles, getting some exercise each day and focus on healthy nutrition, practice positive self talk and stop self negating, believe more in herself and her ability to make changes that can be difficult but lasting, and stay in contact with others who are supportive of her and her need to have healthier habits.  Goal review and progress/challenges noted with patient.  Next appointment within 3 weeks.   Mathis Fare, LCSW

## 2020-10-11 ENCOUNTER — Other Ambulatory Visit: Payer: Self-pay | Admitting: Family Medicine

## 2020-10-11 DIAGNOSIS — G8929 Other chronic pain: Secondary | ICD-10-CM

## 2020-10-11 DIAGNOSIS — M25572 Pain in left ankle and joints of left foot: Secondary | ICD-10-CM

## 2020-10-13 ENCOUNTER — Ambulatory Visit: Payer: 59 | Admitting: Psychiatry

## 2020-10-28 ENCOUNTER — Ambulatory Visit (INDEPENDENT_AMBULATORY_CARE_PROVIDER_SITE_OTHER): Payer: 59 | Admitting: Psychiatry

## 2020-10-28 ENCOUNTER — Other Ambulatory Visit: Payer: Self-pay

## 2020-10-28 DIAGNOSIS — F411 Generalized anxiety disorder: Secondary | ICD-10-CM

## 2020-10-28 NOTE — Progress Notes (Signed)
[facility] Counselor/Therapist Progress Note  Patient ID: Kim Clements, MRN: [id number],    Date: 10/28/2020  Time Spent: 60 minutes   3:00pm to 4:00pm  Treatment Type: Individual Therapy  Reported Symptoms: Anxiety, frustrations  Mental Status Exam:  Appearance:   Casual     Behavior:  Appropriate, Sharing and Motivated  Motor:  Normal  Speech/Language:   Clear and Coherent  Affect:  anxiety  Mood:  anxious  Thought process:  some tangentiality  Thought content:    some obsessiveness  Sensory/Perceptual disturbances:    WNL  Orientation:  oriented to person, place, time/date, situation, day of week, month of year and year  Attention:  Good  Concentration:  Good  Memory:  forgetting worse under stress  Fund of knowledge:   Good  Insight:    Good  Judgment:   Good  Impulse Control:  Good and Fair   Risk Assessment: Danger to Self:  No Self-injurious Behavior: No Danger to Others: No Duty to Warn:no Physical Aggression / Violence:No  Access to Firearms a concern: No  Gang Involvement:No   Subjective: Patient today reporting anxiety related to Covid, family, 1 yr old daughter not in school.   Interventions: Cognitive Behavioral Therapy and Solution-Oriented/Positive Psychology  Diagnosis:   ICD-10-CM   1. Generalized anxiety disorder  F41.1     Plan: Patient not signing tx plan on computer screen due to COVID.  Treatment Goals: Goals may remain on tx plan as patient works to achieve her goals. Progress will be documented each session in the "Progress" section of Plan.  Long Term goal: Reduce overall level, frequency, and intensity of the anxiety so that daily functioning is not impaired.   Short Term goal: Verbalize an understanding of the role that anxious andfearful thinking plays in creating fears, excessive worry, and persistent anxiety symptoms. . Strategy:  Explore cognitive messages that mediate anxiety response and retrain  in adaptive cognitions.  Develop behavioral and cognitive strategies to reduce of eliminate the irrational anxiety. Help client develop healthy self-talk as a means of handling the anxiety.  (Progressing) Patient in today reporting anxiety related to her family, Covid, and 83 yr old daughter not being in school. Has worked well on her sleep improvement and getting to bed 1/2 hr earlier than before and at the end of a month, is going to push it up another 1/2 hr. Needing to work more on her anxiety and limiting exposure to disturbing, anxiety-provoking news and other sources that feed her anxiety.  Is feeling some better with the Covid situation improving more in our area.  Working with patient today more specifically with her short-term goal and strategy in her treatment plan above, focusing on automatic thoughts that feed her anxiety.  She does seem to be more motivated at this point which is a big help, and has contributed to more recent progress.  Encouraged patients getting out more amongst other people including church friends, attending a couple of programs that she is interested in and is meeting in her community and her church, spending some quality time with her 74 year old daughter in activities of mutual interest, practicing positive self talk, stop self negating, believe more in herself and her ability to make changes although difficult, staying in contact with people who are supportive of her and her need to be healthier, getting some exercise through walking, and letting her faith be a support for her and her emotional health.   Goal review and progress/challenges  noted with patient.  Next appointment within 2 to 3 weeks.   Mathis Fare, LCSW

## 2020-11-05 ENCOUNTER — Ambulatory Visit: Payer: 59 | Admitting: Family Medicine

## 2020-11-09 ENCOUNTER — Other Ambulatory Visit: Payer: Self-pay | Admitting: Gastroenterology

## 2020-11-10 ENCOUNTER — Other Ambulatory Visit: Payer: Self-pay | Admitting: Gastroenterology

## 2020-11-10 NOTE — Telephone Encounter (Signed)
Inbound call from [facility] stating they received a denial for script and patient is requesting refills.  Please advise.

## 2020-11-20 ENCOUNTER — Telehealth: Payer: Self-pay

## 2020-11-20 NOTE — Telephone Encounter (Signed)
Pt is requesting a phone call in regards to her Crestor medication

## 2020-11-21 NOTE — Telephone Encounter (Signed)
I returned pt's call, lvm for pt to call the office back.

## 2020-11-24 NOTE — Telephone Encounter (Signed)
I spoke with the pt to discuss concerns about medication below. Pt says that she has been having muscle aches and soreness, she stopped it on Thursday and took it again yesterday. She says that she still has some mild soreness. I advised her to follow up in office if symptoms began to worsen.   Please Advise.

## 2020-11-25 NOTE — Telephone Encounter (Signed)
Have her stop it for 2 weeks and see if soreness stops..  Thanks! Dr. Artis Flock

## 2020-11-25 NOTE — Telephone Encounter (Signed)
Notified patient ,she agreed to stop she will follow up with symptoms in 2 weeks

## 2020-11-27 ENCOUNTER — Ambulatory Visit (INDEPENDENT_AMBULATORY_CARE_PROVIDER_SITE_OTHER): Payer: 59 | Admitting: Psychiatry

## 2020-11-27 ENCOUNTER — Other Ambulatory Visit: Payer: Self-pay

## 2020-11-27 DIAGNOSIS — F411 Generalized anxiety disorder: Secondary | ICD-10-CM

## 2020-11-27 NOTE — Progress Notes (Signed)
[facility] Counselor/Therapist Progress Note  Patient ID: Kim Clements, MRN: [id number],    Date: 11/27/2020  Time Spent: 60 minutes    11:00am to 12:00noon  Treatment Type: Individual Therapy  Reported Symptoms: anxiety  Mental Status Exam:  Appearance:   Casual     Behavior:  Appropriate, Sharing and Motivated  Motor:  Normal  Speech/Language:   Clear and Coherent  Affect:  anxious  Mood:  anxious  Thought process:  goal directed  Thought content:    some rumination  Sensory/Perceptual disturbances:    WNL  Orientation:  oriented to person, place, time/date, situation, day of week, month of year and year  Attention:  Good  Concentration:  Good and Fair  Memory:  some occasional forgetting  Fund of knowledge:   Good  Insight:    Good and Fair  Judgment:   Good and Fair  Impulse Control:  Good and Fair   Risk Assessment: Danger to Self:  No Self-injurious Behavior: No Danger to Others: No Duty to Warn:no Physical Aggression / Violence:No  Access to Firearms a concern: No  Gang Involvement:No   Subjective: Patient today reports anxiety "but is making progress some". Is not as frantic and fearful about Covid.  (See Progress note below.)   Interventions: Cognitive Behavioral Therapy and Solution-Oriented/Positive Psychology  Diagnosis:   ICD-10-CM   1. Generalized anxiety disorder  F41.1      Plan: Patient not signing tx plan on computer screen due to COVID.  Treatment Goals: Goals may remain on tx plan as patient works to achieve her goals. Progress will be documented each session in the "Progress" section of Plan.  Long Term goal: Reduce overall level, frequency, and intensity of the anxiety so that daily functioning is not impaired.   Short Term goal: Verbalize an understanding of the role that anxious andfearful thinking plays in creating fears, excessive worry, and persistent anxiety symptoms. . Strategy:  Explore cognitive  messages that mediate anxiety response and retrain in adaptive cognitions.  Develop behavioral and cognitive strategies to reduce of eliminate the irrational anxiety. Help client develop healthy self-talk as a means of handling the anxiety.  (Progressing) Patient in today reporting improvement in her anxiety, "not checking disturbing news every day online or TV".  Is starting to connect more with other people, expanding her activities more. Less preoccupation with "very stressful, scary, potentially threatening" information.  Sleep improvement stopped as she "got busy and was away some but wanting to start it back; to start tonight and we reviewed the schedule we has worked out before and she states.  Used session to work more on the interruption of anxious thoughts, challenging them, and replacing them with more reality-based and encouraging thoughts.  Used specific examples for her and working on the strategy, which seemed to be helpful to her.  Also processed an issue within the family that is currently happening and creating some stress for her as well as her wondering how she can set some boundaries.  Issue relates to some medical issues with mother-in-law and expectations of patient and her family.  Discussed ways of being helpful, and still setting boundaries that she could feel comfortable with and understand that even within her family, healthy boundaries are important and are a positive thing versus seeing it as a negative for viewing it as restrictive.  Overall, patient has been better about not being so tuned into sources and resources that feed her anxiety and I encouraged her  to continue this.  Also encouraged her to get outside some every day, to be in touch with people who are supportive of her, walking, to follow through on some church activities that she felt would be helpful for her, spending quality time within her family, stop self negating, practice positive self talk, believing herself  more as she tries to make changes, letting her faith be a support for her emotionally and spiritually, and realize the strength she is showing and working on her goals and trying to move forward in a more positive direction.  Goal review and progress/challenges noted with patient.  Next appointment within 3 to 4 weeks.   Mathis Fare, LCSW

## 2020-11-30 ENCOUNTER — Telehealth: Payer: Self-pay | Admitting: Family Medicine

## 2020-12-05 NOTE — Telephone Encounter (Signed)
Patient states the medication was denied and is wondering why when she has an appointment later this month for her annual physical.

## 2020-12-06 NOTE — Telephone Encounter (Signed)
I think there is some confusion on dosage. In December we refilled a 100mg  nad a 50mg . This is for 25mg ... can she clarify the dosage please?  Dr. 

## 2020-12-08 MED ORDER — SERTRALINE HCL 25 MG PO TABS: 25.0000 mg | ORAL_TABLET | Freq: Every day | ORAL | 1 refills | Status: DC

## 2020-12-08 NOTE — Telephone Encounter (Signed)
I spoke with the pt to clarify dosage. Pt says that it was discussed to increase to 100 mg and 50 mg tab. But she says that she would like to stay at 125 mg.

## 2020-12-08 NOTE — Addendum Note (Signed)
Addended by: Orland Mustard on: 12/08/2020 02:52 PM   Modules accepted: Orders

## 2020-12-25 ENCOUNTER — Ambulatory Visit (INDEPENDENT_AMBULATORY_CARE_PROVIDER_SITE_OTHER): Payer: 59 | Admitting: Family Medicine

## 2020-12-25 ENCOUNTER — Other Ambulatory Visit: Payer: Self-pay

## 2020-12-25 ENCOUNTER — Ambulatory Visit (INDEPENDENT_AMBULATORY_CARE_PROVIDER_SITE_OTHER): Payer: 59 | Admitting: Psychiatry

## 2020-12-25 ENCOUNTER — Encounter: Payer: Self-pay | Admitting: Family Medicine

## 2020-12-25 VITALS — BP 106/74 | HR 87 | Temp 97.7°F | Ht 61.0 in | Wt 177.4 lb

## 2020-12-25 DIAGNOSIS — Z0000 Encounter for general adult medical examination without abnormal findings: Secondary | ICD-10-CM

## 2020-12-25 DIAGNOSIS — Z1159 Encounter for screening for other viral diseases: Secondary | ICD-10-CM

## 2020-12-25 DIAGNOSIS — E782 Mixed hyperlipidemia: Secondary | ICD-10-CM | POA: Diagnosis not present

## 2020-12-25 DIAGNOSIS — F339 Major depressive disorder, recurrent, unspecified: Secondary | ICD-10-CM

## 2020-12-25 DIAGNOSIS — Z23 Encounter for immunization: Secondary | ICD-10-CM | POA: Diagnosis not present

## 2020-12-25 DIAGNOSIS — F411 Generalized anxiety disorder: Secondary | ICD-10-CM

## 2020-12-25 DIAGNOSIS — Z114 Encounter for screening for human immunodeficiency virus [HIV]: Secondary | ICD-10-CM

## 2020-12-25 LAB — COMPREHENSIVE METABOLIC PANEL
ALT: 26 U/L (ref 0–35)
AST: 17 U/L (ref 0–37)
Albumin: 4.2 g/dL (ref 3.5–5.2)
Alkaline Phosphatase: 65 U/L (ref 39–117)
BUN: 17 mg/dL (ref 6–23)
CO2: 27 mEq/L (ref 19–32)
Calcium: 9.6 mg/dL (ref 8.4–10.5)
Chloride: 104 mEq/L (ref 96–112)
Creatinine, Ser: 0.86 mg/dL (ref 0.40–1.20)
GFR: 76.11 mL/min (ref >60.00)
Glucose, Bld: 97 mg/dL (ref 70–99)
Potassium: 4.2 mEq/L (ref 3.5–5.1)
Sodium: 139 mEq/L (ref 135–145)
Total Bilirubin: 0.7 mg/dL (ref 0.2–1.2)
Total Protein: 6.5 g/dL (ref 6.0–8.3)

## 2020-12-25 LAB — CBC WITH DIFFERENTIAL/PLATELET
Basophils Absolute: 0 10*3/uL (ref 0.0–0.1)
Basophils Relative: 0.5 % (ref 0.0–3.0)
Eosinophils Absolute: 0.1 10*3/uL (ref 0.0–0.7)
Eosinophils Relative: 1.8 % (ref 0.0–5.0)
HCT: 41.9 % (ref 36.0–46.0)
Hemoglobin: 14.3 g/dL (ref 12.0–15.0)
Lymphocytes Relative: 23.3 % (ref 12.0–46.0)
Lymphs Abs: 1.4 10*3/uL (ref 0.7–4.0)
MCHC: 34.2 g/dL (ref 30.0–36.0)
MCV: 88.3 fl (ref 78.0–100.0)
Monocytes Absolute: 0.3 10*3/uL (ref 0.1–1.0)
Monocytes Relative: 5.2 % (ref 3.0–12.0)
Neutro Abs: 4.3 10*3/uL (ref 1.4–7.7)
Neutrophils Relative %: 69.2 % (ref 43.0–77.0)
Platelets: 260 10*3/uL (ref 150.0–400.0)
RBC: 4.74 Mil/uL (ref 3.87–5.11)
RDW: 13.1 % (ref 11.5–15.5)
WBC: 6.2 10*3/uL (ref 4.0–10.5)

## 2020-12-25 LAB — LIPID PANEL
Cholesterol: 236 mg/dL — ABNORMAL HIGH (ref 0–200)
HDL: 56.8 mg/dL (ref >39.00)
LDL Cholesterol: 155 mg/dL — ABNORMAL HIGH (ref 0–99)
NonHDL: 179.47
Total CHOL/HDL Ratio: 4
Triglycerides: 121 mg/dL (ref 0.0–149.0)
VLDL: 24.2 mg/dL (ref 0.0–40.0)

## 2020-12-25 LAB — TSH: TSH: 2.05 u[IU]/mL (ref 0.35–4.50)

## 2020-12-25 MED ORDER — ROSUVASTATIN CALCIUM 5 MG PO TABS
5.0000 mg | ORAL_TABLET | Freq: Every day | ORAL | 3 refills | Status: DC
Start: 2020-12-25 — End: 2021-06-30

## 2020-12-25 NOTE — Progress Notes (Addendum)
[facility] Counselor/Therapist Progress Note  Patient ID: Kim Clements, MRN: [id number],    Date: 12/25/2020  Time Spent: 60 minutes    2:00pm to 3:00pm   Virtual Visit Note via MyChart Video Connected with patient by a video enabled telemedicine/telehealth application or telephone, with their informed consent, and verified patient privacy and that I am speaking with the correct person using two identifiers. I discussed the limitations, risks, security and privacy concerns of performing psychotherapy and management service by telephone and the availability of in person appointments. I also discussed with the patient that there may be a patient responsible charge related to this service. The patient expressed understanding and agreed to proceed. I discussed the treatment planning with the patient. The patient was provided an opportunity to ask questions and all were answered. The patient agreed with the plan and demonstrated an understanding of the instructions. The patient was advised to call  our office if  symptoms worsen or feel they are in a crisis state and need immediate contact.   Therapist Location: [facility] Patient Location: home  Treatment Type: Individual Therapy  Reported Symptoms: anxiety, sadness re: death of friend's son  Mental Status Exam:  Appearance:   Casual     Behavior:  Appropriate, Sharing and Motivated  Motor:  Normal  Speech/Language:   Clear and Coherent  Affect:  anxious  Mood:  anxious  Thought process:  normal  Thought content:    WNL  Sensory/Perceptual disturbances:    WNL  Orientation:  oriented to person, place, time/date, situation, day of week, month of year and year  Attention:  Good  Concentration:  Good  Memory:  WNL  Fund of knowledge:   Good  Insight:    Good and Fair  Judgment:   Good and Fair  Impulse Control:  Good and Fair   Risk Assessment: Danger to Self:  No Self-injurious Behavior: No Danger to  Others: No Duty to Warn:no Physical Aggression / Violence:No  Access to Firearms a concern: No  Gang Involvement:No   Subjective: Patient reporting anxiety and some sadness re: death of friend's teenage son.    Interventions: Solution-Oriented/Positive Psychology and Ego-Supportive  Diagnosis:   ICD-10-CM   1. Generalized anxiety disorder  F41.1     Plan: Patient not signing tx plan on computer screen due to COVID.  Treatment Goals: Goals may remain on tx plan as patient works to achieve her goals. Progress will be documented each session in the "Progress" section of Plan.  Long Term goal: Reduce overall level, frequency, and intensity of the anxiety so that daily functioning is not impaired.   Short Term goal: Verbalize an understanding of the role that anxious andfearful thinking plays in creating fears, excessive worry, and persistent anxiety symptoms. . Strategy:  Explore cognitive messages that mediate anxiety response and retrain in adaptive cognitions.  Develop behavioral and cognitive strategies to reduce of eliminate the irrational anxiety. Help client develop healthy self-talk as a means of handling the anxiety.  (Progressing) Patient today reporting some anxiety re: family situations and death of friend's teenage son recently. " No depression recently". Is getting involved more in activities at church increasing her interaction with people.  Previously spent a lot of time at home, away from others.  Reports having her annual physical today and "it went well".  Not watching as much "anxiety-provoking" news online or on TV (world violence, Covid, etc). Stopped working on her sleep improvement, which we discussed again  today and she plans to start working on that again with an earlier bedtime. Her PCP prescribes her Zoloft is letting her cut back gradually on that as patient is wondering if that is contributing to some weight gain.  Overall, her anxiety has lessened,  and as noted above she reports no depression recently.  Encouraged patient to continue limiting resources that have tended to feed her anxiety over time, to get outside some every day and walk, to follow through on some activities within her church that widens a circle of people with which she is involved rather than spending so much time at home, to be in touch with people who are supportive, spending quality time within her family and using appropriate boundaries as needed, practicing more positive self talk, letting her faith be a support for her as she works on improving her emotional health, believing in herself more, and feel good about the strength she is showing and working on goal directed behaviors and making better choices and efforts to move in a more positive direction going forward.  Goal review and progress/challenges noted with patient.  Next appointment within 3 to 4 weeks.   Mathis Fare, LCSW

## 2020-12-25 NOTE — Patient Instructions (Addendum)
-to wean off zoloft... Go down to 1101m daily for like a month then break this in 1/2 to 547mday for awhile. After this if you are doing really well you can do your 2548mill for a few weeks then stop. I would go slow with this...    -so I sent in crestor 5mg62m see if you can tolerate this daily, if not try every other day.   -routine labs and tdap (tetanus) this is good for 10 years!   So good to see you! F/u in 6 months for anxiety/depression with alyssa allwardt, PA.   Thanks for letting me take care of you1 Dr.Ikenna Ohms    Preventive Care [id number] Old, Female Preventive care refers to lifestyle choices and visits with your health care provider that can promote health and wellness. This includes:  A yearly physical exam. This is also called an annual wellness visit.  Regular dental and eye exams.  Immunizations.  Screening for certain conditions.  Healthy lifestyle choices, such as: ? Eating a healthy diet. ? Getting regular exercise. ? Not using drugs or products that contain nicotine and tobacco. ? Limiting alcohol use. What can I expect for my preventive care visit? Physical exam Your health care provider will check your:  Height and weight. These may be used to calculate your BMI (body mass index). BMI is a measurement that tells if you are at a healthy weight.  Heart rate and blood pressure.  Body temperature.  Skin for abnormal spots. Counseling Your health care provider may ask you questions about your:  Past medical problems.  Family's medical history.  Alcohol, tobacco, and drug use.  Emotional well-being.  Home life and relationship well-being.  Sexual activity.  Diet, exercise, and sleep habits.  Work and work enviStatisticianccess to firearms.  Method of birth control.  Menstrual cycle.  Pregnancy history. What immunizations do I need? Vaccines are usually given at various ages, according to a schedule. Your health care provider will  recommend vaccines for you based on your age, medical history, and lifestyle or other factors, such as travel or where you work.   What tests do I need? Blood tests  Lipid and cholesterol levels. These may be checked every 5 years, or more often if you are over 56 y60rs old old.  Hepatitis C test.  Hepatitis B test. Screening  Lung cancer screening. You may have this screening every year starting at age 56 i90you have a 30-pack-year history of smoking and currently smoke or have quit within the past 15 years.  Colorectal cancer screening. ? All adults should have this screening starting at age 56 a34 continuing until age 37 continuing until age 37. 23Your health care provider may recommend screening at age 73 i24you are at increased risk. ? You will have tests every 1-10 years, depending on your results and the type of screening test.  Diabetes screening. ? This is done by checking your blood sugar (glucose) after you have not eaten for a while (fasting). ? You may have this done every 1-3 years.  Mammogram. ? This may be done every 1-2 years. ? Talk with your health care provider about when you should start having regular mammograms. This may depend on whether you have a family history of breast cancer.   BRCA-related cancer screening. This may be done if you have a family history of breast, ovarian, tubal, or peritoneal cancers.  Pelvic exam and Pap test. ? This may be done every 3 years starting at  age 56. ? Starting at age 37, this may be done every 5 years if you have a Pap test in combination with an HPV test. Other tests  STD (sexually transmitted disease) testing, if you are at risk.  Bone density scan. This is done to screen for osteoporosis. You may have this scan if you are at high risk for osteoporosis. Talk with your health care provider about your test results, treatment options, and if necessary, the need for more tests. Follow these instructions at home: Eating and drinking  Eat a diet  that includes fresh fruits and vegetables, whole grains, lean protein, and low-fat dairy products.  Take vitamin and mineral supplements as recommended by your health care provider.  Do not drink alcohol if: ? Your health care provider tells you not to drink. ? You are pregnant, may be pregnant, or are planning to become pregnant.  If you drink alcohol: ? Limit how much you have to 0-1 drink a day. ? Be aware of how much alcohol is in your drink. In the [place] one drink equals one 12 oz bottle of beer (355 mL), one 5 oz glass of wine (148 mL), or one 1 oz glass of hard liquor (44 mL).   Lifestyle  Take daily care of your teeth and gums. Brush your teeth every morning and night with fluoride toothpaste. Floss one time each day.  Stay active. Exercise for at least 30 minutes 5 or more days each week.  Do not use any products that contain nicotine or tobacco, such as cigarettes, e-cigarettes, and chewing tobacco. If you need help quitting, ask your health care provider.  Do not use drugs.  If you are sexually active, practice safe sex. Use a condom or other form of protection to prevent STIs (sexually transmitted infections).  If you do not wish to become pregnant, use a form of birth control. If you plan to become pregnant, see your health care provider for a prepregnancy visit.  If told by your health care provider, take low-dose aspirin daily starting at age 56.  Find healthy ways to cope with stress, such as: ? Meditation, yoga, or listening to music. ? Journaling. ? Talking to a trusted person. ? Spending time with friends and family. Safety  Always wear your seat belt while driving or riding in a vehicle.  Do not drive: ? If you have been drinking alcohol. Do not ride with someone who has been drinking. ? When you are tired or distracted. ? While texting.  Wear a helmet and other protective equipment during sports activities.  If you have firearms in your house, make  sure you follow all gun safety procedures. What's next?  Visit your health care provider once a year for an annual wellness visit.  Ask your health care provider how often you should have your eyes and teeth checked.  Stay up to date on all vaccines. This information is not intended to replace advice given to you by your health care provider. Make sure you discuss any questions you have with your health care provider. Document Revised: 05/27/2020 Document Reviewed: 05/04/2018 Elsevier Patient Education  2021 [employer].

## 2020-12-25 NOTE — Progress Notes (Signed)
Patient: Kim Clements MRN: [id number] DOB: May 11, 1966 PCP: Orland Mustard, MD     Subjective:  Chief Complaint  Patient presents with  . Annual Exam  . Depression  . Anxiety    HPI: The patient is a 55 y.o. female who presents today for annual exam. She denies any changes to past medical history. There have been no recent hospitalizations. They are not following a well balanced diet and exercise plan. Weight has been stable. Also following up on chronic medical issues. She is not exercising like she should. She is trying to walk, but needs to do more.   Hyperlipidemia Recently started statin due to aortic atherosclerosis on CT. GH of MI in her mother at age 35 years. She took this for awhile and then started to have some muscle cramps and lower back pain. She stopped taking the statin and it went away.  The 10-year ASCVD risk score Denman George DC Montez Hageman., et al., 2013) is: 1.7%  Anxiety and depression I saw her about 4 months ago and we increased her zoloft to 150mg /day. She is in counseling and I really encouraged exercise. She stayed at 125mg /day and is actually doing so much better. She feels like she is so well managed and it helps that covid is so much better. She also feels like seeing her daughter thrive and do things is helpful as well.   Due for tdap and will give today.   Immunization History  Administered Date(s) Administered  . Influenza Inj Mdck Quad Pf 07/01/2017  . Influenza, Seasonal, Injecte, Preservative Fre 07/01/2017  . Influenza,inj,Quad PF,6+ Mos 06/25/2018  . Influenza,inj,quad, With Preservative 06/09/2016  . Influenza-Unspecified 06/13/2020  . PFIZER(Purple Top)SARS-COV-2 Vaccination 11/05/2019, 11/26/2019  . Tdap 12/12/2006, 12/25/2020   Colonoscopy: 02/12/2016. F/u in 10 years  Mammogram: 04/2020 Pap smear: 03/2017   Review of Systems  Constitutional: Negative for chills, fatigue and fever.  HENT: Negative for dental problem, ear pain, hearing loss  and trouble swallowing.   Eyes: Negative for visual disturbance.  Respiratory: Negative for cough, chest tightness and shortness of breath.   Cardiovascular: Negative for chest pain, palpitations and leg swelling.  Gastrointestinal: Negative for abdominal pain, blood in stool, diarrhea and nausea.  Endocrine: Negative for cold intolerance, polydipsia, polyphagia and polyuria.  Genitourinary: Negative for dysuria and hematuria.  Musculoskeletal: Negative for arthralgias.  Skin: Negative for rash.  Neurological: Negative for dizziness and headaches.  Psychiatric/Behavioral: Negative for dysphoric mood and sleep disturbance. The patient is not nervous/anxious.     Allergies Patient is allergic to erythromycin, penicillins, and sulfa antibiotics.  Past Medical History Patient  has a past medical history of Anal fissure, Anxiety, Arthritis, Asthma, Colon polyps, Diverticulosis, GERD (gastroesophageal reflux disease), HLD (hyperlipidemia), and IBS (irritable bowel syndrome).  Surgical History Patient  has a past surgical history that includes Abdominal hysterectomy (2007); Tonsillectomy (1971); and laparoscopy (1988).  Family History Pateint's family history includes Breast cancer in her paternal aunt and paternal grandmother; Colon polyps in her father and mother; Heart disease in her maternal grandfather, maternal grandmother, mother, and paternal grandfather; Osteoarthritis in her mother; Prostate cancer in her paternal grandfather; Rheum arthritis in her mother; Stroke in her father.  Social History Patient  reports that she has never smoked. She has never used smokeless tobacco. She reports that she does not drink alcohol and does not use drugs.    Objective: Vitals:   12/25/20 1041  BP: 106/74  Pulse: 87  Temp: 97.7 F (36.5 C)  TempSrc: Temporal  SpO2: 97%  Weight: 177 lb 6.4 oz (80.5 kg)  Height: 5\' 1"  (1.549 m)    Body mass index is 33.52 kg/m.  Physical Exam Vitals  reviewed.  Constitutional:      Appearance: Normal appearance. She is well-developed. She is obese.  HENT:     Head: Normocephalic and atraumatic.     Right Ear: Tympanic membrane, ear canal and external ear normal.     Left Ear: Tympanic membrane, ear canal and external ear normal.     Nose: Nose normal.     Mouth/Throat:     Mouth: Mucous membranes are moist.  Eyes:     Extraocular Movements: Extraocular movements intact.     Conjunctiva/sclera: Conjunctivae normal.     Pupils: Pupils are equal, round, and reactive to light.  Neck:     Thyroid: No thyromegaly.     Vascular: No carotid bruit.  Cardiovascular:     Rate and Rhythm: Normal rate and regular rhythm.     Heart sounds: Normal heart sounds. No murmur heard.   Pulmonary:     Effort: Pulmonary effort is normal.     Breath sounds: Normal breath sounds.  Abdominal:     General: Bowel sounds are normal. There is no distension.     Palpations: Abdomen is soft.     Tenderness: There is no abdominal tenderness.  Musculoskeletal:     Cervical back: Normal range of motion and neck supple.  Lymphadenopathy:     Cervical: No cervical adenopathy.  Skin:    General: Skin is warm and dry.     Capillary Refill: Capillary refill takes less than 2 seconds.     Findings: No rash.  Neurological:     General: No focal deficit present.     Mental Status: She is alert and oriented to person, place, and time.     Cranial Nerves: No cranial nerve deficit.     Coordination: Coordination normal.     Deep Tendon Reflexes: Reflexes normal.  Psychiatric:        Mood and Affect: Mood normal.        Behavior: Behavior normal.        Flowsheet Row Office Visit from 12/25/2020 in [facility]  PHQ-2 Total Score 0     GAD 7 : Generalized Anxiety Score 12/25/2020 08/07/2020 05/09/2020 11/30/2019  Nervous, Anxious, on Edge 1 3 2 1   Control/stop worrying 0 2 3 0  Worry too much - different things 1 1 1 1   Trouble  relaxing 0 0 0 1  Restless 0 0 0 0  Easily annoyed or irritable 0 0 0 0  Afraid - awful might happen 0 0 0 0  Total GAD 7 Score 2 6 6 3   Anxiety Difficulty Not difficult at all Somewhat difficult - Somewhat difficult     Assessment/plan: 1. Annual physical exam Routine fasting lab work today. HM reviewed and UTD. Pap/mmg with her GYN. Checking hep C today and giving tdap. Really encouraged exercise! Otherwise she is doing great.  Patient counseling [x]    Nutrition: Stressed importance of moderation in sodium/caffeine intake, saturated fat and cholesterol, caloric balance, sufficient intake of fresh fruits, vegetables, fiber, calcium, iron, and 1 mg of folate supplement per day (for females capable of pregnancy).  [x]    Stressed the importance of regular exercise.   []    Substance Abuse: Discussed cessation/primary prevention of tobacco, alcohol, or other drug use; driving or other dangerous activities under the influence; availability  of treatment for abuse.   [x]    Injury prevention: Discussed safety belts, safety helmets, smoke detector, smoking near bedding or upholstery.   [x]    Sexuality: Discussed sexually transmitted diseases, partner selection, use of condoms, avoidance of unintended pregnancy  and contraceptive alternatives.  [x]    Dental health: Discussed importance of regular tooth brushing, flossing, and dental visits.  [x]    Health maintenance and immunizations reviewed. Please refer to Health maintenance section.    - CBC with Differential/Platelet - Comprehensive metabolic panel - TSH  2. Mixed hyperlipidemia (aortic atherosclerosis)  Sent in crestor 5mg  to try daily or every other day. She is up for this. Let me know if any issues.  - Lipid panel  3. Depression, recurrent (HCC) phq2 score is zero. Very well controlled. Did discuss zoloft taper as she is wanting to come off of this. Wrote this down in detail.   4. GAD (generalized anxiety disorder) GAD7 with  significant improvement. She is doing so well without increased dosage of medication. She is actually wanting to wean off of this. Taper written down. F/u in 6 months.   5. Encounter for hepatitis C screening test for low risk patient  - Hepatitis C antibody  6. Encounter for screening for HIV  - HIV Antibody (routine testing w rflx)  7. Need for Tdap vaccination  - Tdap vaccine greater than or equal to 7yo IM   Return in about 6 months (around 06/26/2021) for anxiety and depression (f/u with alyssa allwardt for TOC as well) .     , MD [facility]  12/25/2020

## 2020-12-26 LAB — HEPATITIS C ANTIBODY
Hepatitis C Ab: NONREACTIVE
SIGNAL TO CUT-OFF: 0.01 (ref <1.00)

## 2020-12-26 LAB — HIV ANTIBODY (ROUTINE TESTING W REFLEX): HIV 1&2 Ab, 4th Generation: NONREACTIVE

## 2021-01-22 ENCOUNTER — Other Ambulatory Visit: Payer: Self-pay

## 2021-01-22 ENCOUNTER — Ambulatory Visit (INDEPENDENT_AMBULATORY_CARE_PROVIDER_SITE_OTHER): Payer: 59 | Admitting: Psychiatry

## 2021-01-22 DIAGNOSIS — F411 Generalized anxiety disorder: Secondary | ICD-10-CM | POA: Diagnosis not present

## 2021-01-22 NOTE — Progress Notes (Signed)
[facility] Counselor/Therapist Progress Note  Patient ID: Kim Clements, MRN: [id number],    Date: 01/22/2021  Time Spent:  60 minutes  Treatment Type: Individual Therapy  Reported Symptoms: anxiety  Mental Status Exam:  Appearance:   Casual and Neat     Behavior:  Appropriate, Sharing and Motivated  Motor:  Normal  Speech/Language:   Clear and Coherent  Affect:  anxious  Mood:  anxious  Thought process:  some obsessiveness  Thought content:    obsessiveness  Sensory/Perceptual disturbances:    WNL  Orientation:  oriented to person, place, time/date, situation, day of week, month of year and year  Attention:  Good  Concentration:  Good and Fair  Memory:  WNL  Fund of knowledge:   Good  Insight:    Good and Fair  Judgment:   Good and Fair  Impulse Control:  Good   Risk Assessment: Danger to Self:  No Self-injurious Behavior: No Danger to Others: No Duty to Warn:no Physical Aggression / Violence:No  Access to Firearms a concern: No  Gang Involvement:No   Subjective:  Patient in today reporting anxiety and that she is managing anxiety some better. Working to limit her consumption of disturbing new online, as that aggravates her anxiety. Processed some of her grief issues related to friend who lost a teenage child recently that has been upsetting for patient. Has not followed through on exercise and we discussed today her getting more motivated for exercise. Also talked through some "worries about my mom who is aging and having lots of health concerns". Still having some fearful/anxious thoughts re: "possible surge again of Covid." States "its all anxiety, no depression."  Being more involved with other adults at church and in her community is helping as she is not spending quite as much time cooped up at home.  Stopped working on improving her sleep pattern and getting to bed at a much more reasonable time but commits today to start back working on that.   Currently getting to bed around 2:00 AM and the next goal is to get it to 1:30 AM.  We discussed how some of these changes around exercise and sleep patterns can definitely play a part in helping her anxious mood.   Interventions: Cognitive Behavioral Therapy, Solution-Oriented/Positive Psychology and Ego-Supportive  Diagnosis:   ICD-10-CM   1. Generalized anxiety disorder  F41.1     Treatment Goal Plan: Patient not signing tx plan on computer screen due to COVID. Treatment Goals: Goals may remain on tx plan as patient works to achieve her goals. Progress will be documented each session in the "Progress" section of Plan. Long Term goal: Reduce overall level, frequency, and intensity of the anxiety so that daily functioning is not impaired.  Short Term goal: Verbalize an understanding of the role that anxious andfearful thinking plays in creating fears, excessive worry, and persistent anxiety symptoms. Strategy:  Explore cognitive messages that mediate anxiety response and retrain in adaptive cognitions.  Develop behavioral and cognitive strategies to reduce of eliminate the irrational anxiety. Help client develop healthy self-talk as a means of handling the anxiety.    Progress / Plan:  Patient today is motivated and very open and her discussion of progress in some areas and not so much progress and others.  Has definitely progressed in being around people more and getting out of the house more but not followed through an exercise nor having a healthier sleep pattern.  She is committed to working  on those between now and next session.  Her overall anxiety has lessened some.  Encouraged patient to continue limiting her watching of online disturbing news as that has tended to feed her anxiety, to get outside some each day and walk, to continue following through on activities at her church that increases the circle of people with which she is involved rather than spending so much time at  home, to remain in touch with people who are supportive of her, to spend quality time with her family, practice more consistently positive self talk and self-care, use appropriate boundaries with others as needed, letting her faith be an emotional support, believing in herself more, and to recognize the strength she shows and working on goal-directed behaviors and making better choices as she tries to move forward in a more positive direction even though change is very difficult for her.  Goal review and progress/challenges noted with patient.  Next appointment within 3 to 4 weeks.   Mathis Fare, LCSW

## 2021-02-23 ENCOUNTER — Ambulatory Visit: Payer: 59 | Admitting: Psychiatry

## 2021-03-25 ENCOUNTER — Other Ambulatory Visit: Payer: Self-pay

## 2021-03-25 ENCOUNTER — Ambulatory Visit: Payer: 59 | Admitting: Psychiatry

## 2021-03-25 DIAGNOSIS — F411 Generalized anxiety disorder: Secondary | ICD-10-CM | POA: Diagnosis not present

## 2021-03-25 NOTE — Progress Notes (Signed)
[facility] Counselor/Therapist Progress Note  Patient ID: Kim Clements, MRN: [id number],    Date: 03/25/2021  Time Spent: 58 minutes   Treatment Type: Individual Therapy  Reported Symptoms: anxiety (improving)  Mental Status Exam:  Appearance:   Casual     Behavior:  Appropriate, Sharing, and Motivated  Motor:  Normal  Speech/Language:   Clear and Coherent  Affect:  anxious  Mood:  anxious  Thought process:  goal directed  Thought content:    Some obsessiveness  Sensory/Perceptual disturbances:    WNL  Orientation:  oriented to person, place, time/date, situation, day of week, month of year, year, and stated date of March 25, 2021  Attention:  Good  Concentration:  Good and Fair  Memory:  WNL  Fund of knowledge:   Good  Insight:    Good and Fair  Judgment:   Good and Fair  Impulse Control:  Good and Fair   Risk Assessment: Danger to Self:  No Self-injurious Behavior: No Danger to Others: No Duty to Warn:no Physical Aggression / Violence:No  Access to Firearms a concern: No  Gang Involvement:No   Subjective:  Patient in today reporting anxiety has improved. Is currently anxious about her elderly mom who recently turned 80 and patient senses more cognitive issues. Patient today states she needs to process her anxiety and sadness related to her mom as she sees mom declining in multiple ways. Did well talking through her concern and sadness. In addition, patient was able to realize and share the deep gratitude she feels for her mom. States she tries to focus more on the positives and her gratitude and that helps her cope.  She has done well with continuing to limit her consumption of disturbing news online or TV.  States her worrying has noticeably increased.  Remains involved in more activities at her church or in the community or within family.  Reviewed goals and progress with patient and she is feeling good her progress.  We plan to make her next appt  further out at this point.    Interventions: Cognitive Behavioral Therapy and Solution-Oriented/Positive Psychology  Diagnosis:   ICD-10-CM   1. Generalized anxiety disorder  F41.1       Treatment Goal Plan: Patient not signing tx plan on computer screen due to COVID. Treatment Goals: Goals may remain on tx plan as patient works to achieve her goals.  Progress will be documented each session in the "Progress" section of Plan. Long Term goal: Reduce overall level, frequency, and intensity of the anxiety so that daily functioning is not impaired. Short Term goal: Verbalize an understanding of the role that anxious and fearful thinking plays in creating fears, excessive worry, and persistent anxiety symptoms. Strategy: Explore cognitive messages that mediate anxiety response and retrain in adaptive cognitions. Develop behavioral and cognitive strategies to reduce of eliminate the irrational anxiety. Help client develop healthy self-talk as a means of handling the anxiety.    Plan:  Patient today showing good motivation and good progress.  As noted above, because of the progress we are scheduling her out further this time.  She knows she can call in the meantime if needed but is feeling encouraged due to her progress and is feeling more confident and better managing her anxiety.  Encouraged patient to continue limiting online disturbing news that feeds anxiety, continue the church and community activities in which she is involved, to get outside some each day and walk, to remain in touch  with people who are supportive of her, to spend quality time with her family, practice more consistent positive self talk, use appropriate boundaries with others as needed, let her faith be an emotional support as well as spiritual, believe in herself more, and feel good about the strength she shows in working on goal-directed behaviors and making better choices as she tries to move forward in a more positive  direction of improved emotional health.  Goal review and progress/challenges noted with patient.  Next appointment within approx 4-5 weeks.   Mathis Fare, LCSW

## 2021-05-11 ENCOUNTER — Other Ambulatory Visit: Payer: Self-pay | Admitting: Gastroenterology

## 2021-05-12 ENCOUNTER — Telehealth: Payer: Self-pay | Admitting: Gastroenterology

## 2021-05-12 NOTE — Telephone Encounter (Signed)
Inbound call from patient. Need refill for Viberzi Best contact number [phone]

## 2021-05-13 ENCOUNTER — Other Ambulatory Visit: Payer: Self-pay | Admitting: Gastroenterology

## 2021-05-13 MED ORDER — VIBERZI 75 MG PO TABS: 1.0000 | ORAL_TABLET | Freq: Two times a day (BID) | ORAL | 0 refills | Status: DC

## 2021-05-13 NOTE — Telephone Encounter (Signed)
Patient informed. 

## 2021-05-13 NOTE — Telephone Encounter (Addendum)
Patient called states she advised someone earlier that [facility] does not have her script for Viberzi and she is needs it. Please resend and call patient so she know it was done today if possible please.

## 2021-05-13 NOTE — Telephone Encounter (Signed)
Refill was sent on 05/11/21

## 2021-05-13 NOTE — Addendum Note (Signed)
Addended by: Mariane Duval on: 05/13/2021 04:53 PM   Modules accepted: Orders

## 2021-05-13 NOTE — Telephone Encounter (Signed)
Refaxed script to [facility].

## 2021-05-28 ENCOUNTER — Ambulatory Visit: Payer: 59 | Admitting: Psychiatry

## 2021-06-04 DIAGNOSIS — Z0289 Encounter for other administrative examinations: Secondary | ICD-10-CM

## 2021-06-16 ENCOUNTER — Other Ambulatory Visit: Payer: Self-pay | Admitting: Family Medicine

## 2021-06-16 DIAGNOSIS — M25572 Pain in left ankle and joints of left foot: Secondary | ICD-10-CM

## 2021-06-16 DIAGNOSIS — G8929 Other chronic pain: Secondary | ICD-10-CM

## 2021-06-18 ENCOUNTER — Telehealth: Payer: Self-pay

## 2021-06-18 NOTE — Telephone Encounter (Signed)
Pt called for a few questions about COVID. She stated that her daughter has COVID and she wants to know what she should or should not do. Kim Clements would like a call back. Please Advise.

## 2021-06-19 NOTE — Telephone Encounter (Signed)
Patients questions were answered informed to monitor symptoms

## 2021-06-26 ENCOUNTER — Ambulatory Visit: Payer: 59 | Admitting: Physician Assistant

## 2021-06-30 ENCOUNTER — Other Ambulatory Visit: Payer: Self-pay

## 2021-06-30 ENCOUNTER — Ambulatory Visit: Payer: 59 | Admitting: Physician Assistant

## 2021-06-30 VITALS — BP 107/73 | HR 81 | Temp 98.0°F | Ht 61.0 in | Wt 175.0 lb

## 2021-06-30 DIAGNOSIS — I700 Atherosclerosis of aorta: Secondary | ICD-10-CM

## 2021-06-30 DIAGNOSIS — E782 Mixed hyperlipidemia: Secondary | ICD-10-CM

## 2021-06-30 DIAGNOSIS — F411 Generalized anxiety disorder: Secondary | ICD-10-CM | POA: Diagnosis not present

## 2021-06-30 DIAGNOSIS — F339 Major depressive disorder, recurrent, unspecified: Secondary | ICD-10-CM

## 2021-06-30 MED ORDER — PRAVASTATIN SODIUM 40 MG PO TABS: 40.0000 mg | ORAL_TABLET | Freq: Every day | ORAL | 1 refills | Status: DC

## 2021-06-30 NOTE — Progress Notes (Signed)
Subjective:    Patient ID: Kim Clements, female    DOB: [birth year], 55 y.o.   MRN: [id number]  Chief Complaint  Patient presents with   Establish Care   Depression    HPI Patient is in today for transfer of care from Dr. Artis Flock. She is doing very well and has no acute concerns to address, just recheck of chronic issues. See A/P.   Past Medical History:  Diagnosis Date   Anal fissure    Anxiety    Arthritis    Asthma    Colon polyps    Diverticulosis    GERD (gastroesophageal reflux disease)    HLD (hyperlipidemia)    IBS (irritable bowel syndrome)     Past Surgical History:  Procedure Laterality Date   ABDOMINAL HYSTERECTOMY  2007   LAPAROSCOPY  1988   Laser. Indication: Endometriosis.   TONSILLECTOMY  1971    Family History  Problem Relation Age of Onset   Breast cancer Paternal Grandmother        all of paternal females   Breast cancer Paternal Aunt    Prostate cancer Paternal Grandfather    Heart disease Paternal Grandfather    Colon polyps Mother    Heart disease Mother    Rheum arthritis Mother    Osteoarthritis Mother    Colon polyps Father    Stroke Father    Heart disease Maternal Grandfather    Heart disease Maternal Grandmother    Colon cancer Neg Hx     Social History   Tobacco Use   Smoking status: Never   Smokeless tobacco: Never  Vaping Use   Vaping Use: Never used  Substance Use Topics   Alcohol use: No    Alcohol/week: 0.0 standard drinks   Drug use: No     Allergies  Allergen Reactions   Erythromycin Rash   Penicillins Hives   Sulfa Antibiotics Rash    Review of Systems REFER TO HPI FOR PERTINENT POSITIVES AND NEGATIVES      Objective:     BP 107/73   Pulse 81   Temp 98 F (36.7 C)   Ht 5\' 1"  (1.549 m)   Wt 175 lb (79.4 kg)   SpO2 97%   BMI 33.07 kg/m   Wt Readings from Last 3 Encounters:  06/30/21 175 lb (79.4 kg)  12/25/20 177 lb 6.4 oz (80.5 kg)  08/07/20 175 lb (79.4 kg)    BP Readings  from Last 3 Encounters:  06/30/21 107/73  12/25/20 106/74  08/07/20 113/75     Physical Exam Vitals and nursing note reviewed.  Constitutional:      Appearance: Normal appearance. She is obese. She is not toxic-appearing.  HENT:     Head: Normocephalic and atraumatic.     Right Ear: External ear normal.     Left Ear: External ear normal.     Nose: Nose normal.     Mouth/Throat:     Mouth: Mucous membranes are moist.  Eyes:     Extraocular Movements: Extraocular movements intact.     Conjunctiva/sclera: Conjunctivae normal.     Pupils: Pupils are equal, round, and reactive to light.  Cardiovascular:     Rate and Rhythm: Normal rate and regular rhythm.     Pulses: Normal pulses.     Heart sounds: Normal heart sounds.  Pulmonary:     Effort: Pulmonary effort is normal.     Breath sounds: Normal breath sounds.  Abdominal:     Tenderness:  There is no right CVA tenderness or left CVA tenderness.  Musculoskeletal:        General: Normal range of motion.     Cervical back: Normal range of motion and neck supple.  Skin:    General: Skin is warm and dry.  Neurological:     General: No focal deficit present.     Mental Status: She is alert and oriented to person, place, and time.  Psychiatric:        Mood and Affect: Mood normal.        Behavior: Behavior normal.        Thought Content: Thought content normal.        Judgment: Judgment normal.       Assessment & Plan:   Problem List Items Addressed This Visit       Cardiovascular and Mediastinum   Aortic atherosclerosis (HCC)   Relevant Medications   pravastatin (PRAVACHOL) 40 MG tablet     Other   HLD (hyperlipidemia) - Primary   Relevant Medications   pravastatin (PRAVACHOL) 40 MG tablet   GAD (generalized anxiety disorder)   Depression, recurrent (HCC)     Meds ordered this encounter  Medications   pravastatin (PRAVACHOL) 40 MG tablet    Sig: Take 1 tablet (40 mg total) by mouth daily.    Dispense:  90  tablet    Refill:  1    1. Mixed hyperlipidemia 2. Aortic atherosclerosis (HCC) Lab Results  Component Value Date   CHOL 236 (H) 12/25/2020   HDL 56.80 12/25/2020   LDLCALC 155 (H) 12/25/2020   TRIG 121.0 12/25/2020   CHOLHDL 4 12/25/2020   The 10-year ASCVD risk score (Arnett DK, et al., 2019) is: 1.6%  Patient had previously been tried on Crestor 5 mg daily, which she could not tolerate due to myalgias.  She still has myalgias with every other day dosing and states she has not taken this medication in the last month or so.  With her history of aortic atherosclerosis on imaging, despite her 10-year risk score, advised that it would still be in her best interest to trial another statin.  We will start on pravastatin 40 mg and she will let me know how she does with this.  3. Depression, recurrent (HCC) 4. GAD (generalized anxiety disorder) PHQ9 score of zero.  She has completely weaned off the Zoloft.  She continues to do counseling.  She is very happy to be off medication and doing so well.   This note was prepared with assistance of Conservation officer, historic buildings. Occasional wrong-word or sound-a-like substitutions may have occurred due to the inherent limitations of voice recognition software.   Trula Frede M Samaa Ueda, PA-C

## 2021-07-07 ENCOUNTER — Ambulatory Visit (INDEPENDENT_AMBULATORY_CARE_PROVIDER_SITE_OTHER): Payer: 59 | Admitting: Bariatrics

## 2021-07-07 ENCOUNTER — Encounter (INDEPENDENT_AMBULATORY_CARE_PROVIDER_SITE_OTHER): Payer: Self-pay | Admitting: Bariatrics

## 2021-07-07 ENCOUNTER — Ambulatory Visit: Payer: 59 | Admitting: Psychiatry

## 2021-07-07 ENCOUNTER — Other Ambulatory Visit: Payer: Self-pay

## 2021-07-07 VITALS — BP 121/71 | HR 86 | Temp 98.4°F | Ht 61.0 in | Wt 170.0 lb

## 2021-07-07 DIAGNOSIS — R5383 Other fatigue: Secondary | ICD-10-CM | POA: Diagnosis not present

## 2021-07-07 DIAGNOSIS — Z1331 Encounter for screening for depression: Secondary | ICD-10-CM

## 2021-07-07 DIAGNOSIS — R7309 Other abnormal glucose: Secondary | ICD-10-CM

## 2021-07-07 DIAGNOSIS — E782 Mixed hyperlipidemia: Secondary | ICD-10-CM | POA: Diagnosis not present

## 2021-07-07 DIAGNOSIS — Z6832 Body mass index (BMI) 32.0-32.9, adult: Secondary | ICD-10-CM

## 2021-07-07 DIAGNOSIS — I700 Atherosclerosis of aorta: Secondary | ICD-10-CM

## 2021-07-07 DIAGNOSIS — E669 Obesity, unspecified: Secondary | ICD-10-CM

## 2021-07-07 DIAGNOSIS — K580 Irritable bowel syndrome with diarrhea: Secondary | ICD-10-CM | POA: Diagnosis not present

## 2021-07-07 DIAGNOSIS — R0602 Shortness of breath: Secondary | ICD-10-CM | POA: Diagnosis not present

## 2021-07-07 DIAGNOSIS — E559 Vitamin D deficiency, unspecified: Secondary | ICD-10-CM

## 2021-07-08 LAB — COMPREHENSIVE METABOLIC PANEL
ALT: 35 IU/L — ABNORMAL HIGH (ref 0–32)
AST: 19 IU/L (ref 0–40)
Albumin/Globulin Ratio: 2.1 (ref 1.2–2.2)
Albumin: 4.6 g/dL (ref 3.8–4.9)
Alkaline Phosphatase: 87 IU/L (ref 44–121)
BUN/Creatinine Ratio: 13 (ref 9–23)
BUN: 11 mg/dL (ref 6–24)
Bilirubin Total: 0.4 mg/dL (ref 0.0–1.2)
CO2: 24 mmol/L (ref 20–29)
Calcium: 9.7 mg/dL (ref 8.7–10.2)
Chloride: 104 mmol/L (ref 96–106)
Creatinine, Ser: 0.84 mg/dL (ref 0.57–1.00)
Globulin, Total: 2.2 g/dL (ref 1.5–4.5)
Glucose: 99 mg/dL (ref 70–99)
Potassium: 4.6 mmol/L (ref 3.5–5.2)
Sodium: 143 mmol/L (ref 134–144)
Total Protein: 6.8 g/dL (ref 6.0–8.5)
eGFR: 82 mL/min/{1.73_m2} (ref >59)

## 2021-07-08 LAB — HEMOGLOBIN A1C
Est. average glucose Bld gHb Est-mCnc: 111 mg/dL
Hgb A1c MFr Bld: 5.5 % (ref 4.8–5.6)

## 2021-07-08 LAB — LIPID PANEL WITH LDL/HDL RATIO
Cholesterol, Total: 234 mg/dL — ABNORMAL HIGH (ref 100–199)
HDL: 51 mg/dL (ref >39)
LDL Chol Calc (NIH): 161 mg/dL — ABNORMAL HIGH (ref 0–99)
LDL/HDL Ratio: 3.2 ratio (ref 0.0–3.2)
Triglycerides: 125 mg/dL (ref 0–149)
VLDL Cholesterol Cal: 22 mg/dL (ref 5–40)

## 2021-07-08 LAB — INSULIN, RANDOM: INSULIN: 9.4 u[IU]/mL (ref 2.6–24.9)

## 2021-07-08 LAB — VITAMIN D 25 HYDROXY (VIT D DEFICIENCY, FRACTURES): Vit D, 25-Hydroxy: 35 ng/mL (ref 30.0–100.0)

## 2021-07-09 NOTE — Progress Notes (Signed)
Chief Complaint:   OBESITY Kim Clements (MR# [id number]) is a 55 y.o. female who presents for evaluation and treatment of obesity and related comorbidities. Current BMI is Body mass index is 32.12 kg/m. Kim Clements has been struggling with her weight for many years and has been unsuccessful in either losing weight, maintaining weight loss, or reaching her healthy weight goal.  Kim Clements is currently in the action stage of change and ready to dedicate time achieving and maintaining a healthier weight. Kim Clements is interested in becoming our patient and working on intensive lifestyle modifications including (but not limited to) diet and exercise for weight loss.  Kim Clements habits were reviewed today and are as follows: Her family eats meals together, she thinks her family will eat healthier with her, her desired weight loss is 53 pounds, she started gaining weight in the 2000s, her heaviest weight ever was 178 pounds, she craves pizza, pasta, and sweet tea, she snacks frequently in the evenings, she is frequently drinking liquids with calories, she frequently makes poor food choices, she frequently eats larger portions than normal, and she struggles with emotional eating.  Depression Screen Kim Clements Food and Mood (modified PHQ-9) score was 8.  Depression screen PHQ 2/9 07/07/2021  Decreased Interest 1  Down, Depressed, Hopeless 0  PHQ - 2 Score 1  Altered sleeping 0  Tired, decreased energy 3  Change in appetite 1  Feeling bad or failure about yourself  0  Trouble concentrating 1  Moving slowly or fidgety/restless 2  Suicidal thoughts 0  PHQ-9 Score 8  Difficult doing work/chores Not difficult at all   Subjective:   1. Other fatigue Kim Clements denies daytime somnolence and denies waking up still tired. Patent has a history of symptoms of snoring. Kim Clements generally gets 6 or 7 hours of sleep per night, and states that she has generally restful sleep. Snoring is present.  Apneic episodes are not present. Epworth Sleepiness Score is 3.  Occurs with certain activities.   2. SOB (shortness of breath) on exertion Kim Clements notes increasing shortness of breath with exercising and seems to be worsening over time with weight gain. She notes getting out of breath sooner with activity than she used to. This has gotten worse recently. Kim Clements denies shortness of breath at rest or orthopnea.  Occurs with certain activities.  3. Mixed hyperlipidemia Kim Clements has hyperlipidemia and has been trying to improve her cholesterol levels with intensive lifestyle modification including a low saturated fat diet, exercise and weight loss. She denies any chest pain, claudication or myalgias.  She is to start pravastatin, per PCP.  Lab Results  Component Value Date   ALT 35 (H) 07/07/2021   AST 19 07/07/2021   ALKPHOS 87 07/07/2021   BILITOT 0.4 07/07/2021   Lab Results  Component Value Date   CHOL 234 (H) 07/07/2021   HDL 51 07/07/2021   LDLCALC 161 (H) 07/07/2021   TRIG 125 07/07/2021   CHOLHDL 4 12/25/2020   4. Irritable bowel syndrome with diarrhea Taking simethicone (gas), Colestid.  5. Vitamin D deficiency She is taking OTC vitamin D.  Lab Results  Component Value Date   VD25OH 35.0 07/07/2021   VD25OH 38.98 11/30/2019   VD25OH 27.51 (L) 02/14/2018   6. Aortic atherosclerosis (HCC) Found on CT.  7. Elevated glucose Kim Clements has history of elevated glucose.  8. Depression screen Kim Clements was screened for depression as part of her new patient workup today.  PHQ-9 is 8.  Assessment/Plan:   1.  Other fatigue Kim Clements does feel that her weight is causing her energy to be lower than it should be. Fatigue may be related to obesity, depression or many other causes. Labs will be ordered, and in the meanwhile, [name] will focus on self care including making healthy food choices, increasing physical activity and focusing on stress reduction.  Gradually  increase activities.   - EKG 12-Lead  2. SOB (shortness of breath) on exertion Kim Clements does feel that she gets out of breath more easily that she used to when she exercises. Kim Clements shortness of breath appears to be obesity related and exercise induced. She has agreed to work on weight loss and gradually increase exercise to treat her exercise induced shortness of breath. Will continue to monitor closely.  Gradually increase activities.  3. Mixed hyperlipidemia Will begin medication (pravastatin).  Will also check lipid panel today.  - Comprehensive metabolic panel - Lipid Panel With LDL/HDL Ratio  4. Irritable bowel syndrome with diarrhea Continue medications.  5. Vitamin D deficiency Will check vitamin D level today.  - VITAMIN D 25 Hydroxy (Vit-D Deficiency, Fractures)  6. Aortic atherosclerosis (HCC) Will begin Pravachol.  Will check CMP and lipid panel today.  - Comprehensive metabolic panel - Lipid Panel With LDL/HDL Ratio  7. Elevated glucose Check CMP, A1c, and insulin level today.  - Comprehensive metabolic panel - Hemoglobin A1c - Insulin, random  8. Depression screen Kim Clements had a positive depression screening. Depression is commonly associated with obesity and often results in emotional eating behaviors. We will monitor this closely and work on CBT to help improve the non-hunger eating patterns. Referral to Psychology may be required if no improvement is seen as she continues in our clinic.  9. Class 1 obesity with serious comorbidity and body mass index (BMI) of 32.0 to 32.9 in adult, unspecified obesity type  Kim Clements is currently in the action stage of change and her goal is to continue with weight loss efforts. I recommend Kim Clements begin the structured treatment plan as follows:  She has agreed to the Category 2 Plan and keeping a food journal and adhering to recommended goals of 1200 calories and 80 grams of protein.  She will work on meal  planning, intentional eating, and stopping all sugary drinks.  Reviewed labs from 12/25/2020, including CMP, lipid panel, CBC, glucose, and TSH.  Exercise goals: No exercise has been prescribed at this time.   Behavioral modification strategies: increasing lean protein intake, decreasing simple carbohydrates, increasing vegetables, increasing water intake, decreasing eating out, no skipping meals, meal planning and cooking strategies, keeping healthy foods in the home, dealing with family or coworker sabotage, travel eating strategies, holiday eating strategies , and celebration eating strategies.  She was informed of the importance of frequent follow-up visits to maximize her success with intensive lifestyle modifications for her multiple health conditions. She was informed we would discuss her lab results at her next visit unless there is a critical issue that needs to be addressed sooner. Kim Clements agreed to keep her next visit at the agreed upon time to discuss these results.  Objective:   Blood pressure 121/71, pulse 86, temperature 98.4 F (36.9 C), height 5\' 1"  (1.549 m), weight 170 lb (77.1 kg), SpO2 97 %. Body mass index is 32.12 kg/m.  EKG: Normal sinus rhythm, rate 91 bpm.  Indirect Calorimeter completed today shows a VO2 of 234 and a REE of 2264.  Her calculated basal metabolic rate is 2265 thus her basal metabolic rate is better than  expected.  General: Cooperative, alert, well developed, in no acute distress. HEENT: Conjunctivae and lids unremarkable. Cardiovascular: Regular rhythm.  Lungs: Normal work of breathing. Neurologic: No focal deficits.   Lab Results  Component Value Date   CREATININE 0.84 07/07/2021   BUN 11 07/07/2021   NA 143 07/07/2021   K 4.6 07/07/2021   CL 104 07/07/2021   CO2 24 07/07/2021   Lab Results  Component Value Date   ALT 35 (H) 07/07/2021   AST 19 07/07/2021   ALKPHOS 87 07/07/2021   BILITOT 0.4 07/07/2021   Lab Results  Component  Value Date   HGBA1C 5.5 07/07/2021   Lab Results  Component Value Date   INSULIN 9.4 07/07/2021   Lab Results  Component Value Date   TSH 2.05 12/25/2020   Lab Results  Component Value Date   CHOL 234 (H) 07/07/2021   HDL 51 07/07/2021   LDLCALC 161 (H) 07/07/2021   TRIG 125 07/07/2021   CHOLHDL 4 12/25/2020   Lab Results  Component Value Date   WBC 6.2 12/25/2020   HGB 14.3 12/25/2020   HCT 41.9 12/25/2020   MCV 88.3 12/25/2020   PLT 260.0 12/25/2020   Attestation Statements:   Reviewed by clinician on day of visit: allergies, medications, problem list, medical history, surgical history, family history, social history, and previous encounter notes.  I, Insurance claims handler, CMA, am acting as Energy manager for [employer], DO  I have reviewed the above documentation for accuracy and completeness, and I agree with the above. Corinna Capra, DO

## 2021-07-13 ENCOUNTER — Encounter (INDEPENDENT_AMBULATORY_CARE_PROVIDER_SITE_OTHER): Payer: Self-pay | Admitting: Bariatrics

## 2021-07-14 NOTE — Telephone Encounter (Signed)
Please review

## 2021-07-15 NOTE — Telephone Encounter (Signed)
Please review

## 2021-07-21 ENCOUNTER — Other Ambulatory Visit: Payer: Self-pay

## 2021-07-21 ENCOUNTER — Encounter (INDEPENDENT_AMBULATORY_CARE_PROVIDER_SITE_OTHER): Payer: Self-pay | Admitting: Bariatrics

## 2021-07-21 ENCOUNTER — Ambulatory Visit (INDEPENDENT_AMBULATORY_CARE_PROVIDER_SITE_OTHER): Payer: 59 | Admitting: Bariatrics

## 2021-07-21 VITALS — BP 148/84 | HR 80 | Temp 98.0°F | Ht 61.0 in | Wt 167.0 lb

## 2021-07-21 DIAGNOSIS — E669 Obesity, unspecified: Secondary | ICD-10-CM | POA: Diagnosis not present

## 2021-07-21 DIAGNOSIS — E559 Vitamin D deficiency, unspecified: Secondary | ICD-10-CM | POA: Diagnosis not present

## 2021-07-21 DIAGNOSIS — Z6832 Body mass index (BMI) 32.0-32.9, adult: Secondary | ICD-10-CM | POA: Diagnosis not present

## 2021-07-21 DIAGNOSIS — E7849 Other hyperlipidemia: Secondary | ICD-10-CM | POA: Diagnosis not present

## 2021-07-22 ENCOUNTER — Encounter (INDEPENDENT_AMBULATORY_CARE_PROVIDER_SITE_OTHER): Payer: Self-pay | Admitting: Bariatrics

## 2021-07-22 NOTE — Telephone Encounter (Signed)
Please review

## 2021-07-22 NOTE — Progress Notes (Signed)
Chief Complaint:   OBESITY Kim Clements is here to discuss her progress with her obesity treatment plan along with follow-up of her obesity related diagnoses. Kim Clements is on the Category 2 Plan and states she is following her eating plan approximately 99% of the time. Kim Clements states she is doing 0 minutes 0 times per week.  Today's visit was #: 3 Starting weight: 170 lbs Starting date: 07/07/2021 Today's weight: 167 lbs Today's date:07/21/2021 Total lbs lost to date: 3 lbs Total lbs lost since last in-office visit: 3 lbs  Interim History: Kim Clements is down 3 lbs since her last visit. She missed her carbohydrates. She is doing well with her water.  Subjective:   1. Vitamin D deficiency Kim Clements is currently taking OTC Vitamin D 2000 IU. Her last Vitamin D level was 35.0  2. Other hyperlipidemia Kim Clements will start Pravachol per her primary care provider.   Assessment/Plan:   1. Vitamin D deficiency Low Vitamin D level contributes to fatigue and are associated with obesity, breast, and colon cancer. Kim Clements agrees to increase her Vitamin D to 5000 IU OTC daily and she will follow-up for routine testing of Vitamin D, at least 2-3 times per year to avoid over-replacement. We will recheck in 3-4 months.   2. Other hyperlipidemia Cardiovascular risk and specific lipid/LDL goals reviewed.  We discussed several lifestyle modifications today and Kim Clements will continue to work on diet, exercise and weight loss efforts. Kim Clements will continue Pravachol. She will have zero trans fats and increase MUFAs and PUFAs. Orders and follow up as documented in patient record.   Counseling Intensive lifestyle modifications are the first line treatment for this issue. Dietary changes: Increase soluble fiber. Decrease simple carbohydrates. Exercise changes: Moderate to vigorous-intensity aerobic activity 150 minutes per week if tolerated. Lipid-lowering medications: see documented in medical  record.   3. Obesity, current BMI 31.6 Kim Clements is currently in the action stage of change. As such, her goal is to continue with weight loss efforts. She has agreed to the Category 2 Plan and keeping a food journal and adhering to recommended goals of 1200 calories and 80 grams of protein.   Kim Clements will continue meal planning. We reviewed labs from 07/07/2021 CMP, Lipid, Vitamin D, A1C and Insulin.  Exercise goals: No exercise has been prescribed at this time.  Behavioral modification strategies: increasing lean protein intake, decreasing simple carbohydrates, increasing vegetables, increasing water intake, decreasing eating out, no skipping meals, meal planning and cooking strategies, keeping healthy foods in the home, and planning for success.  Kim Clements has agreed to follow-up with our clinic in 2 weeks with Adah Salvage, FNP or William Hamburger, NP. She was informed of the importance of frequent follow-up visits to maximize her success with intensive lifestyle modifications for her multiple health conditions.   Objective:   Blood pressure (!) 148/84, pulse 80, temperature 98 F (36.7 C), height 5\' 1"  (1.549 m), weight 167 lb (75.8 kg), SpO2 98 %. Body mass index is 31.55 kg/m.  General: Cooperative, alert, well developed, in no acute distress. HEENT: Conjunctivae and lids unremarkable. Cardiovascular: Regular rhythm.  Lungs: Normal work of breathing. Neurologic: No focal deficits.   Lab Results  Component Value Date   CREATININE 0.84 07/07/2021   BUN 11 07/07/2021   NA 143 07/07/2021   K 4.6 07/07/2021   CL 104 07/07/2021   CO2 24 07/07/2021   Lab Results  Component Value Date   ALT 35 (H) 07/07/2021   AST 19 07/07/2021  ALKPHOS 87 07/07/2021   BILITOT 0.4 07/07/2021   Lab Results  Component Value Date   HGBA1C 5.5 07/07/2021   Lab Results  Component Value Date   INSULIN 9.4 07/07/2021   Lab Results  Component Value Date   TSH 2.05 12/25/2020   Lab Results   Component Value Date   CHOL 234 (H) 07/07/2021   HDL 51 07/07/2021   LDLCALC 161 (H) 07/07/2021   TRIG 125 07/07/2021   CHOLHDL 4 12/25/2020   Lab Results  Component Value Date   VD25OH 35.0 07/07/2021   VD25OH 38.98 11/30/2019   VD25OH 27.51 (L) 02/14/2018   Lab Results  Component Value Date   WBC 6.2 12/25/2020   HGB 14.3 12/25/2020   HCT 41.9 12/25/2020   MCV 88.3 12/25/2020   PLT 260.0 12/25/2020   No results found for: IRON, TIBC, FERRITIN  Attestation Statements:   Reviewed by clinician on day of visit: allergies, medications, problem list, medical history, surgical history, family history, social history, and previous encounter notes.  Time spent on visit including pre-visit chart review and post-visit care was 30 minutes.   I, Jackson Latino, RMA, am acting as Energy manager for [employer], DO.   I have reviewed the above documentation for accuracy and completeness, and I agree with the above. Corinna Capra, DO

## 2021-08-12 ENCOUNTER — Other Ambulatory Visit: Payer: Self-pay | Admitting: Gastroenterology

## 2021-08-12 ENCOUNTER — Ambulatory Visit (INDEPENDENT_AMBULATORY_CARE_PROVIDER_SITE_OTHER): Payer: 59 | Admitting: Family Medicine

## 2021-08-12 ENCOUNTER — Encounter (INDEPENDENT_AMBULATORY_CARE_PROVIDER_SITE_OTHER): Payer: Self-pay | Admitting: Family Medicine

## 2021-08-12 ENCOUNTER — Other Ambulatory Visit: Payer: Self-pay

## 2021-08-12 VITALS — BP 107/73 | HR 78 | Temp 97.9°F | Ht 61.0 in | Wt 166.0 lb

## 2021-08-12 DIAGNOSIS — R030 Elevated blood-pressure reading, without diagnosis of hypertension: Secondary | ICD-10-CM | POA: Diagnosis not present

## 2021-08-12 DIAGNOSIS — E669 Obesity, unspecified: Secondary | ICD-10-CM | POA: Diagnosis not present

## 2021-08-12 DIAGNOSIS — E8881 Metabolic syndrome: Secondary | ICD-10-CM | POA: Diagnosis not present

## 2021-08-12 DIAGNOSIS — Z6832 Body mass index (BMI) 32.0-32.9, adult: Secondary | ICD-10-CM | POA: Diagnosis not present

## 2021-08-12 NOTE — Progress Notes (Signed)
Chief Complaint:   OBESITY Kim Clements is here to discuss her progress with her obesity treatment plan along with follow-up of her obesity related diagnoses. Kim Clements is on the Category 2 Plan and keeping a food journal and adhering to recommended goals of 1200 calories and 80 grams protein and states she is following her eating plan approximately 98-99% of the time. Kim Clements states she is not currently exercising.  Today's visit was #: 3 Starting weight: 170 lbs Starting date: 07/07/2021 Today's weight: 166 lbs Today's date: 08/12/2021 Total lbs lost to date: 4 Total lbs lost since last in-office visit: 1  Interim History: Kim Clements was on plan even over Thanksgiving for the most part. She is drinking 40 oz of water per day. Kim Clements is here for a follow up office visit.  We reviewed her meal plan and questions were answered.  Patient's food recall appears to be accurate and consistent with what is on plan when she is following it.   When eating on plan, her hunger and cravings are well controlled. Pt drinks sweet tea that she makes with 1 cup of sugar but is not calculating it as snack calories.   Subjective:   1. Insulin resistance New. Discussed labs with patient today. Kim Clements has a diagnosis of insulin resistance based on her elevated fasting insulin level >5. She continues to work on diet and exercise to decrease her risk of diabetes. Medication: None  2. Elevated blood pressure reading BP at goal today. Pt has no history of hypertension.   Assessment/Plan:  No orders of the defined types were placed in this encounter.   Medications Discontinued During This Encounter  Medication Reason   dicyclomine (BENTYL) 10 MG capsule      No orders of the defined types were placed in this encounter.    1. Insulin resistance Kim Clements will continue to work on weight loss, exercise, and decreasing simple carbohydrates to help decrease the risk of diabetes.  Kim Clements agreed to follow-up with [place] as directed to closely monitor her progress. Education in insulin resistance done with pt and handout provided.  2. Elevated blood pressure reading BP at goal today, which is improved from last OV. Pt will continue current treatment plan with increasing water intake to goal of 80 oz per day and decrease salt intake and weight loss.  3. Obesity with current BMI of 31.4  Kim Clements is currently in the action stage of change. As such, her goal is to continue with weight loss efforts. She has agreed to the Category 2 Plan and keeping a food journal and adhering to recommended goals of 1000-1200 calories and 80+ grams protein.   Pt will do category 2 with journaling and bring log to next OV for Dr. Earlene Plater to review. Pt prefers to do it by hand and not use MFP/Lose it.  Exercise goals:  As is  Behavioral modification strategies: better snacking choices, avoiding temptations, planning for success, and keeping a strict food journal.  Kim Clements has agreed to follow-up with our clinic in 2 weeks. She was informed of the importance of frequent follow-up visits to maximize her success with intensive lifestyle modifications for her multiple health conditions.   Objective:   Blood pressure 107/73, pulse 78, temperature 97.9 F (36.6 C), height 5\' 1"  (1.549 m), weight 166 lb (75.3 kg), SpO2 100 %. Body mass index is 31.37 kg/m.  General: Cooperative, alert, well developed, in no acute distress. HEENT: Conjunctivae and lids unremarkable. Cardiovascular: Regular rhythm.  Lungs: Normal work of breathing. Neurologic: No focal deficits.   Lab Results  Component Value Date   CREATININE 0.84 07/07/2021   BUN 11 07/07/2021   NA 143 07/07/2021   K 4.6 07/07/2021   CL 104 07/07/2021   CO2 24 07/07/2021   Lab Results  Component Value Date   ALT 35 (H) 07/07/2021   AST 19 07/07/2021   ALKPHOS 87 07/07/2021   BILITOT 0.4 07/07/2021   Lab Results  Component  Value Date   HGBA1C 5.5 07/07/2021   Lab Results  Component Value Date   INSULIN 9.4 07/07/2021   Lab Results  Component Value Date   TSH 2.05 12/25/2020   Lab Results  Component Value Date   CHOL 234 (H) 07/07/2021   HDL 51 07/07/2021   LDLCALC 161 (H) 07/07/2021   TRIG 125 07/07/2021   CHOLHDL 4 12/25/2020   Lab Results  Component Value Date   VD25OH 35.0 07/07/2021   VD25OH 38.98 11/30/2019   VD25OH 27.51 (L) 02/14/2018   Lab Results  Component Value Date   WBC 6.2 12/25/2020   HGB 14.3 12/25/2020   HCT 41.9 12/25/2020   MCV 88.3 12/25/2020   PLT 260.0 12/25/2020    Attestation Statements:   Reviewed by clinician on day of visit: allergies, medications, problem list, medical history, surgical history, family history, social history, and previous encounter notes.  Time spent on visit including pre-visit chart review and post-visit care and charting was 30 minutes.   Edmund Hilda, CMA, am acting as transcriptionist for [employer], DO.  I have reviewed the above documentation for accuracy and completeness, and I agree with the above. Carlye Grippe, D.O.  The 21st Century Cures Act was signed into law in 2016 which includes the topic of electronic health records.  This provides immediate access to information in MyChart.  This includes consultation notes, operative notes, office notes, lab results and pathology reports.  If you have any questions about what you read please let us know at your next visit so we can discuss your concerns and take corrective action if need be.  We are right here with you.

## 2021-08-13 NOTE — Telephone Encounter (Signed)
Patient called states she is not able to get her refill for Viberzi and she always has an issues with refills not sure why. She is requesting a call back once done so she knows because she will run out by this weekend.

## 2021-08-14 ENCOUNTER — Telehealth: Payer: Self-pay | Admitting: Gastroenterology

## 2021-08-14 ENCOUNTER — Other Ambulatory Visit: Payer: Self-pay | Admitting: Gastroenterology

## 2021-08-14 NOTE — Telephone Encounter (Signed)
Spoke with the pt and she has been advised that the prescription was sent this morning.  She will pick up from her pharmacy when available.

## 2021-08-14 NOTE — Telephone Encounter (Signed)
Patient called and stated that she was going to be running out of her Viberzi possibly this weekend if proscription is not filled today. She is seeking advice in regards to what she should do if she does run out. Please advise.

## 2021-08-19 ENCOUNTER — Telehealth: Payer: Self-pay

## 2021-08-19 NOTE — Telephone Encounter (Signed)
Pt called stating that she believes that Pravastatin is causing her to have muscle pain in her hip and ankle. Pt wants a call back to discuss something else that she may can take. Please Advise.

## 2021-08-20 NOTE — Telephone Encounter (Signed)
Mrs. Trew called in returning phone call

## 2021-08-20 NOTE — Telephone Encounter (Signed)
Notified patient she voices understanding 

## 2021-08-20 NOTE — Telephone Encounter (Signed)
Left voice message for patient to call clinic.  

## 2021-08-24 ENCOUNTER — Ambulatory Visit (INDEPENDENT_AMBULATORY_CARE_PROVIDER_SITE_OTHER): Payer: 59 | Admitting: Psychiatry

## 2021-08-24 ENCOUNTER — Other Ambulatory Visit: Payer: Self-pay

## 2021-08-24 DIAGNOSIS — F411 Generalized anxiety disorder: Secondary | ICD-10-CM

## 2021-08-24 NOTE — Progress Notes (Signed)
[facility] Counselor/Therapist Progress Note  Patient ID: Kim Clements, MRN: [id number],    Date: 08/24/2021  Time Spent: 55 minutes      Treatment Type: Individual Therapy  Reported Symptoms:  anxiety improved                 Mental Status Exam:  Appearance:   Casual     Behavior:  Appropriate, Sharing, and Motivated  Motor:  Normal  Speech/Language:   Clear and Coherent  Affect:  Anxious (improving)  Mood:  Anxious (better)  Thought process:  normal  Thought content:    WNL  Sensory/Perceptual disturbances:    WNL  Orientation:  oriented to person, place, time/date, situation, day of week, month of year, year, and stated date of Dec. 19, 2022  Attention:  Good  Concentration:  Good  Memory:  WNL  Fund of knowledge:   Good  Insight:    Good and Fair  Judgment:   Good  Impulse Control:  Good   Risk Assessment: Danger to Self:  No Self-injurious Behavior: No Danger to Others: No Duty to Warn:no Physical Aggression / Violence:No  Access to Firearms a concern: No  Gang Involvement:No   Subjective:  Patient in today reporting anxiety about multiple family member recently diagnosed with Covid recently and have been quite sick, with one of them testing for pneumonia yesterday. Patient and family had Covid back in October and relatives being diagnosed, has heightened her anxiety some. Has had some other family/health related stressors which she shared today. Concerned about her mom's aging and is currently 56 yrs old. Is involved in "healthy weight loss program" and has had some success. Review of treatment goals, especially re: her anxious thoughts and the level, frequency, and intensity of her anxiety. Has improved significantly with goal directed behaviors. Encouraged patient to continue with better management of anxious thoughts, and use treatment goals as a support in continuing to move forward. Will continue working with strategies and tx goal plan as she is  doing much better. To call when needed for an appt at this time.  Interventions: Solution-Oriented/Positive Psychology and Ego-Supportive  Treatment Goal Plan: Patient not signing tx plan on computer screen due to COVID. Treatment Goals: Goals may remain on tx plan as patient works to achieve her goals.  Progress will be documented each session in the "Progress" section of Plan. Long Term goal: Reduce overall level, frequency, and intensity of the anxiety so that daily functioning is not impaired. Short Term goal: Verbalize an understanding of the role that anxious and fearful thinking plays in creating fears, excessive worry, and persistent anxiety symptoms. Strategy: Explore cognitive messages that mediate anxiety response and retrain in adaptive cognitions. Develop behavioral and cognitive strategies to reduce of eliminate the irrational anxiety. Help client develop healthy self-talk as a means of handling the anxiety.  Diagnosis:   ICD-10-CM   1. Generalized anxiety disorder  F41.1      Plan: Patient today showing good motivation and continued good progress on her anxiety with the help of treatment goal-directed behaviors.  Shared several examples of how she has experienced improvement and is more motivated at this point.  She does not need to schedule an appointment today but agrees to continue her working on her behavioral changes to better manage anxiety and will call for an appointment when needed.  Encouraged patient to continue with some of the things that have helped her thus far including: Limiting any online disturbing  news that feeds her anxiety, continue involvement and church and community activities, get outside some each day and walk when possible, remain in touch with people who are supportive of her, spend quality time with her family, practice consistent positive self talk, use appropriate boundaries with others as needed, let her faith be an emotional support as well as  spiritual, and continue the strength she shows in working with goal-directed behaviors to make better choices and use helpful strategies as she moves forward in a positive direction of better managing anxiety and improved emotional health.  Goal review and progress/challenges noted with patient.  Showing significant progress.  Patient to call for an appointment when needed.  This record has been created using [employer].  Chart creation errors have been sought, but may not always have been located and corrected.  Such creation errors do not reflect on the standard of medical care provided.   Mathis Fare, LCSW

## 2021-08-25 ENCOUNTER — Encounter (INDEPENDENT_AMBULATORY_CARE_PROVIDER_SITE_OTHER): Payer: Self-pay | Admitting: Family Medicine

## 2021-08-25 ENCOUNTER — Other Ambulatory Visit: Payer: Self-pay

## 2021-08-25 ENCOUNTER — Ambulatory Visit (INDEPENDENT_AMBULATORY_CARE_PROVIDER_SITE_OTHER): Payer: 59 | Admitting: Family Medicine

## 2021-08-25 VITALS — BP 118/74 | HR 83 | Temp 97.4°F | Ht 61.0 in | Wt 163.0 lb

## 2021-08-25 DIAGNOSIS — F411 Generalized anxiety disorder: Secondary | ICD-10-CM | POA: Diagnosis not present

## 2021-08-25 DIAGNOSIS — E7800 Pure hypercholesterolemia, unspecified: Secondary | ICD-10-CM | POA: Diagnosis not present

## 2021-08-25 DIAGNOSIS — E8881 Metabolic syndrome: Secondary | ICD-10-CM

## 2021-08-25 DIAGNOSIS — E559 Vitamin D deficiency, unspecified: Secondary | ICD-10-CM

## 2021-08-25 DIAGNOSIS — E669 Obesity, unspecified: Secondary | ICD-10-CM

## 2021-08-25 DIAGNOSIS — Z6830 Body mass index (BMI) 30.0-30.9, adult: Secondary | ICD-10-CM

## 2021-08-25 DIAGNOSIS — E88819 Insulin resistance, unspecified: Secondary | ICD-10-CM

## 2021-08-26 NOTE — Progress Notes (Signed)
Chief Complaint:   OBESITY Kim Clements is here to discuss her progress with her obesity treatment plan along with follow-up of her obesity related diagnoses. See Medical Weight Management Flowsheet for complete bioelectrical impedance results.  Today's visit was #: 4 Starting weight: 170 lbs Starting date: 07/07/2021 Weight change since last visit: 3 lbs Total lbs lost to date: 7 lbs Total weight loss percentage to date: -4.12%  Nutrition Plan: Category 2 Plan and keeping a food journal and adhering to recommended goals of 1000-1200 calories and 80+ grams of protein daily for 98% of the time. Activity: None.  Interim History: Kim Clements says her anxiety has improved through counseling.  She says she is now taking pravastatin 1/2 tablet every other day.    Assessment/Plan:   1. Insulin resistance Not at goal. Goal is HgbA1c < 5.7, fasting insulin closer to 5.  Medication: None.    Plan:  She will continue to focus on protein-rich, low simple carbohydrate foods. We reviewed the importance of hydration, regular exercise for stress reduction, and restorative sleep.   Lab Results  Component Value Date   HGBA1C 5.5 07/07/2021   Lab Results  Component Value Date   INSULIN 9.4 07/07/2021   2. Vitamin D deficiency Improving, but not optimized. She is taking OTC vitamin D 1,000 IU daily.  Plan: Continue current OTC vitamin D supplementation.  Follow-up for routine testing of Vitamin D, at least 2-3 times per year to avoid over-replacement.  Lab Results  Component Value Date   VD25OH 35.0 07/07/2021   VD25OH 38.98 11/30/2019   VD25OH 27.51 (L) 02/14/2018   3. Pure hypercholesterolemia Course: Not at goal. Lipid-lowering medications: pravastatin 20 mg every other day.   Plan: Dietary changes: Increase soluble fiber, decrease simple carbohydrates, decrease saturated fat. Exercise changes: Moderate to vigorous-intensity aerobic activity 150 minutes per week or as tolerated. We  will continue to monitor along with PCP/specialists as it pertains to her weight loss journey.  Lab Results  Component Value Date   CHOL 234 (H) 07/07/2021   HDL 51 07/07/2021   LDLCALC 161 (H) 07/07/2021   TRIG 125 07/07/2021   CHOLHDL 4 12/25/2020   Lab Results  Component Value Date   ALT 35 (H) 07/07/2021   AST 19 07/07/2021   ALKPHOS 87 07/07/2021   BILITOT 0.4 07/07/2021   The 10-year ASCVD risk score (Arnett DK, et al., 2019) is: 2.2%   Values used to calculate the score:     Age: 55 years     Sex: Female     Is Non-Hispanic African American: No     Diabetic: No     Tobacco smoker: No     Systolic Blood Pressure: 118 mmHg     Is BP treated: No     HDL Cholesterol: 51 mg/dL     Total Cholesterol: 234 mg/dL  4. GAD (generalized anxiety disorder) Kim Clements is going to counseling and says it is helping with her anxiety.  Plan: Behavior modification techniques were discussed today to help deal with emotional/non-hunger eating behaviors.  5. Obesity with current BMI of 30.8  Course: Kim Clements is currently in the action stage of change. As such, her goal is to continue with weight loss efforts.   Nutrition goals: She has agreed to the Category 2 Plan and keeping a food journal and adhering to recommended goals of 1000-1200 calories and 80+ grams of protein.   Exercise goals:  Increase NEAT.  Behavioral modification strategies: increasing lean protein intake,  decreasing simple carbohydrates, increasing vegetables, increasing water intake, and decreasing liquid calories.  Kim Clements has agreed to follow-up with our clinic in 2-3 weeks. She was informed of the importance of frequent follow-up visits to maximize her success with intensive lifestyle modifications for her multiple health conditions.   Objective:   Blood pressure 118/74, pulse 83, temperature (!) 97.4 F (36.3 C), temperature source Oral, height 5\' 1"  (1.549 m), weight 163 lb (73.9 kg), SpO2 99 %. Body mass  index is 30.8 kg/m.  General: Cooperative, alert, well developed, in no acute distress. HEENT: Conjunctivae and lids unremarkable. Cardiovascular: Regular rhythm.  Lungs: Normal work of breathing. Neurologic: No focal deficits.   Lab Results  Component Value Date   CREATININE 0.84 07/07/2021   BUN 11 07/07/2021   NA 143 07/07/2021   K 4.6 07/07/2021   CL 104 07/07/2021   CO2 24 07/07/2021   Lab Results  Component Value Date   ALT 35 (H) 07/07/2021   AST 19 07/07/2021   ALKPHOS 87 07/07/2021   BILITOT 0.4 07/07/2021   Lab Results  Component Value Date   HGBA1C 5.5 07/07/2021   Lab Results  Component Value Date   INSULIN 9.4 07/07/2021   Lab Results  Component Value Date   TSH 2.05 12/25/2020   Lab Results  Component Value Date   CHOL 234 (H) 07/07/2021   HDL 51 07/07/2021   LDLCALC 161 (H) 07/07/2021   TRIG 125 07/07/2021   CHOLHDL 4 12/25/2020   Lab Results  Component Value Date   VD25OH 35.0 07/07/2021   VD25OH 38.98 11/30/2019   VD25OH 27.51 (L) 02/14/2018   Lab Results  Component Value Date   WBC 6.2 12/25/2020   HGB 14.3 12/25/2020   HCT 41.9 12/25/2020   MCV 88.3 12/25/2020   PLT 260.0 12/25/2020   Attestation Statements:   Reviewed by clinician on day of visit: allergies, medications, problem list, medical history, surgical history, family history, social history, and previous encounter notes.  Time spent on visit including pre-visit chart review and post-visit care and documentation was 42 minutes. Time was spent on: During the visit, I independently reviewed the patient's EKG, bioimpedance scale results, and indirect calorimeter results. Food choices and timing of food intake reviewed today. I discussed a personalized meal plan with the patient that will help her to lose weight and will improve her obesity-related conditions going forward. I performed a medically necessary appropriate examination and/or evaluation. I discussed the assessment and  treatment plan with the patient. Motivational interviewing as well as evidence-based interventions for health behavior change were utilized today including the discussion of self monitoring techniques, problem-solving barriers and SMART goal setting techniques.  An exercise prescription was reviewed.  The patient was provided an opportunity to ask questions and all were answered. The patient agreed with the plan and demonstrated an understanding of the instructions. Clinical information was updated and documented in the EMR.  I, 12/27/2020, CMA, am acting as transcriptionist for Insurance claims handler, DO  I have reviewed the above documentation for accuracy and completeness, and I agree with the above. -  Helane Rima, DO, MS, FAAFP, DABOM - Family and Bariatric Medicine.

## 2021-09-08 ENCOUNTER — Encounter: Payer: Self-pay | Admitting: Nurse Practitioner

## 2021-09-08 ENCOUNTER — Ambulatory Visit (INDEPENDENT_AMBULATORY_CARE_PROVIDER_SITE_OTHER): Payer: 59 | Admitting: Nurse Practitioner

## 2021-09-08 VITALS — BP 102/60 | HR 80 | Ht 61.5 in | Wt 166.2 lb

## 2021-09-08 DIAGNOSIS — K589 Irritable bowel syndrome without diarrhea: Secondary | ICD-10-CM

## 2021-09-08 MED ORDER — VIBERZI 75 MG PO TABS: 1.0000 | ORAL_TABLET | Freq: Two times a day (BID) | ORAL | 3 refills | Status: DC

## 2021-09-08 NOTE — Patient Instructions (Addendum)
Hold Colestipol if you develop constipation including hard stools or are not having a daily BM. If things do not improve then call our office as we may need to hold Viberzi.   Follow up in 1 year  Continue Viberzi, we will send in a new prescription for you   If you are age 57 or older, your body mass index should be between 23-30. Your Body mass index is 30.9 kg/m. If this is out of the aforementioned range listed, please consider follow up with your Primary Care Provider.  If you are age 41 or younger, your body mass index should be between 19-25. Your Body mass index is 30.9 kg/m. If this is out of the aformentioned range listed, please consider follow up with your Primary Care Provider.   ________________________________________________________  The [facility] providers would like to encourage you to use [facility] to communicate with providers for non-urgent requests or questions.  Due to long hold times on the telephone, sending your provider a message by [facility] may be a faster and more efficient way to get a response.  Please allow 48 business hours for a response.  Please remember that this is for non-urgent requests.  _______________________________________________________   I appreciate the  opportunity to care for you  Thank You   [place]

## 2021-09-08 NOTE — Progress Notes (Signed)
ASSESSMENT AND PLAN    # 56 yo female with chronic IBS-D doing well for last few years on managed with Viberzi and Colestipol. Still has occasional loose stool depending on anxiety level but some recent dietary changes have diminished those episodes. Having at least one formed stool daily. No abdominal pain  --Refill Viberzi BID, 90 day supply with 4 refills --Cautioned about potential for constipation now that she has made some recent dietary changes leading to even less frequent episodes of diarrhea. She will first hold Colestipol if not having a daily BM. If still not having a daily BM then hold Virberzi and contact out office.   --Ten year screening colonoscopy due June 2017. Diverticulosis on last colonoscopy in 2017, no polyps.  Patient gives a history of colon polyps 2006 ( [facility]). Follow up colonoscopy with [facility] in 2011 was negative for polyps   HISTORY OF PRESENT ILLNESS    Chief Complaint : needs refill on virberzi  Kim Clements is a 56 y.o. female known to Dr. Silverio Decamp with a past medical history of IBS-D, GERD, diverticulosis, anxiety / depression  Additional medical history as listed in [place] .   Patient followed by Dr. Silverio Decamp for IBS and GERD Lennox Pippins has made a big difference for her. She still occasionally has diarhea, especially when anxious. Taking it twice daily. She is participating in [facility] Loss program. Eating less carbohydrates, higher protein   She has lost 7 pounds since starting program early November. Dietary changes have led to some bowel habit changes. She hasn't been having any diarrhea but is not constipated.    She was told a few years ago to avoid diary. She has been avoiding dairy for three years. She inquires about trying to reintroduce limited amount of dairy into diet.   Patient mentions that she often feels need to have a BM in am upon getting out of bed. Sometimes she just has flatus, sometimes has a BM . Sometimes stools  are hard and difficult to expel but she thinks it is diet related.  Overall,  her BMs are solid and she has has a BM everyday. No blood in stool.   Current Medications, Allergies, Past Medical History, Past Surgical History, Family History and Social History were reviewed in [employer] record.     Current Outpatient Medications  Medication Sig Dispense Refill   AMBULATORY NON FORMULARY MEDICATION Allergy Shots 2 shots per week     amitriptyline (ELAVIL) 10 MG tablet TAKE 1 TABLET BY MOUTH EVERY DAY AT NIGHT 90 tablet 0   bismuth subsalicylate (PEPTO BISMOL) 262 MG/15ML suspension Take 30 mLs by mouth as needed.     calcium carbonate (TUMS - DOSED IN MG ELEMENTAL CALCIUM) 500 MG chewable tablet Chew 1 tablet by mouth daily.     Cholecalciferol (VITAMIN D3) 25 MCG (1000 UT) CAPS Take 5 capsules by mouth daily.     colestipol (COLESTID) 1 g tablet Take 1 tablet (1 g total) by mouth 2 (two) times daily. 180 tablet 3   Efinaconazole (JUBLIA) 10 % SOLN Apply 1 application topically daily.     fexofenadine (ALLEGRA) 180 MG tablet Take 180 mg by mouth daily.     Loperamide HCl (IMODIUM A-D PO) Take by mouth as needed.     Simethicone (GAS-X PO) Take by mouth as needed.     VIBERZI 75 MG TABS TAKE 1 TABLET BY MOUTH TWICE A DAY 180 tablet 0  No current facility-administered medications for this visit.    Review of Systems: No chest pain. No shortness of breath. No urinary complaints.   PHYSICAL EXAM :    Wt Readings from Last 3 Encounters:  09/08/21 166 lb 4 oz (75.4 kg)  08/25/21 163 lb (73.9 kg)  08/12/21 166 lb (75.3 kg)    BP 102/60 (BP Location: Left Arm, Patient Position: Sitting, Cuff Size: Normal)    Pulse 80    Ht 5' 1.5" (1.562 m) Comment: height measured without shoes   Wt 166 lb 4 oz (75.4 kg)    BMI 30.90 kg/m  Constitutional:  Generally well appearing female in no acute distress. Here with daughter Psychiatric: Pleasant. Normal mood and affect.  Behavior is normal. EENT: Pupils normal.  Conjunctivae are normal. No scleral icterus. Neck supple.  Cardiovascular: Normal rate, regular rhythm. No edema Pulmonary/chest: Effort normal and breath sounds normal. No wheezing, rales or rhonchi. Abdominal: Soft, nondistended, nontender. Bowel sounds active throughout. There are no masses palpable. No hepatomegaly. Neurological: Alert and oriented to person place and time. Skin: Skin is warm and dry. No rashes noted.  Tye Savoy, NP  09/08/2021, 2:58 PM

## 2021-09-10 ENCOUNTER — Other Ambulatory Visit: Payer: Self-pay

## 2021-09-10 ENCOUNTER — Encounter (INDEPENDENT_AMBULATORY_CARE_PROVIDER_SITE_OTHER): Payer: Self-pay | Admitting: Family Medicine

## 2021-09-10 ENCOUNTER — Ambulatory Visit (INDEPENDENT_AMBULATORY_CARE_PROVIDER_SITE_OTHER): Payer: 59 | Admitting: Family Medicine

## 2021-09-10 VITALS — BP 107/72 | HR 80 | Temp 98.0°F | Ht 61.0 in | Wt 162.0 lb

## 2021-09-10 DIAGNOSIS — Z6830 Body mass index (BMI) 30.0-30.9, adult: Secondary | ICD-10-CM

## 2021-09-10 DIAGNOSIS — E669 Obesity, unspecified: Secondary | ICD-10-CM

## 2021-09-10 DIAGNOSIS — F411 Generalized anxiety disorder: Secondary | ICD-10-CM

## 2021-09-10 DIAGNOSIS — E8881 Metabolic syndrome: Secondary | ICD-10-CM | POA: Diagnosis not present

## 2021-09-10 DIAGNOSIS — E559 Vitamin D deficiency, unspecified: Secondary | ICD-10-CM

## 2021-09-13 ENCOUNTER — Other Ambulatory Visit: Payer: Self-pay | Admitting: Physician Assistant

## 2021-09-13 DIAGNOSIS — G8929 Other chronic pain: Secondary | ICD-10-CM

## 2021-09-14 NOTE — Progress Notes (Signed)
Chief Complaint:   OBESITY Kim Clements is here to discuss her progress with her obesity treatment plan along with follow-up of her obesity related diagnoses. Kim Clements is on the Category 2 Plan and keeping a food journal and adhering to recommended goals of 1000-1200 calories and 80 grams protein and states she is following her eating plan approximately 85% of the time. Kim Clements states she is not currently exercising.  Today's visit was #: 5 Starting weight: 170 lbs Starting date: 07/07/2021 Today's weight: 162 lbs Today's date: 09/10/2021 Total lbs lost to date: 8 Total lbs lost since last in-office visit: 1  Interim History: Pt changed to journaling at last OV (category 2 but journal). She is often hitting 80-90 grams of protein and averaging 1200 calories per day. Pt is very consistent with breakfast and lunch.  Subjective:   1. Vitamin D deficiency Kim Clements's last Vit D level was 35.0 on 07/07/2021. She takes 5 capsules of OTC Vit D3 1000 IU QD.  2. Insulin resistance Pt has no carb cravings when eating on plan. Her hunger is controlled.  3. GAD (generalized anxiety disorder) Pt's counselor released her and advised that she only needs to return prn. Pt's mood has improved.  Assessment/Plan:  No orders of the defined types were placed in this encounter.   There are no discontinued medications.   No orders of the defined types were placed in this encounter.    1. Vitamin D deficiency We will consider a script for Vit D in the future, if level does not reach goal upon repeat labs February 1st. Continue 5,000 IU QD.  2. Insulin resistance Kim Clements will continue to work on weight loss, exercise, and decreasing simple carbohydrates to help decrease the risk of diabetes. Kim Clements agreed to follow-up with [place] as directed to closely monitor her progress. Continue prudent nutritional plan, weight loss, increase protein.  3. GAD (generalized anxiety disorder) We discussed pt's  need to exercise the strategies her counselor gave her in order to keep condition well managed. Increase exercise per counselor, as well as what we recommend.  4. Obesity with current BMI of 30.6  Kim Clements is currently in the action stage of change. As such, her goal is to continue with weight loss efforts. She has agreed to the Category 2 Plan and keeping a food journal and adhering to recommended goals of 1000-1200 calories and 80 grams protein.   Exercise goals:  Pt desires to move more in the new year. Goal is 10 minutes 2 days a week and to increase water intake to 70 oz per day.  Behavioral modification strategies: planning for success.  Kim Clements has agreed to follow-up with our clinic in 3 weeks. She was informed of the importance of frequent follow-up visits to maximize her success with intensive lifestyle modifications for her multiple health conditions.   Objective:   Blood pressure 107/72, pulse 80, temperature 98 F (36.7 C), height 5\' 1"  (1.549 m), weight 162 lb (73.5 kg), SpO2 98 %. Body mass index is 30.61 kg/m.  General: Cooperative, alert, well developed, in no acute distress. HEENT: Conjunctivae and lids unremarkable. Cardiovascular: Regular rhythm.  Lungs: Normal work of breathing. Neurologic: No focal deficits.   Lab Results  Component Value Date   CREATININE 0.84 07/07/2021   BUN 11 07/07/2021   NA 143 07/07/2021   K 4.6 07/07/2021   CL 104 07/07/2021   CO2 24 07/07/2021   Lab Results  Component Value Date   ALT 35 (H) 07/07/2021  AST 19 07/07/2021   ALKPHOS 87 07/07/2021   BILITOT 0.4 07/07/2021   Lab Results  Component Value Date   HGBA1C 5.5 07/07/2021   Lab Results  Component Value Date   INSULIN 9.4 07/07/2021   Lab Results  Component Value Date   TSH 2.05 12/25/2020   Lab Results  Component Value Date   CHOL 234 (H) 07/07/2021   HDL 51 07/07/2021   LDLCALC 161 (H) 07/07/2021   TRIG 125 07/07/2021   CHOLHDL 4 12/25/2020   Lab  Results  Component Value Date   VD25OH 35.0 07/07/2021   VD25OH 38.98 11/30/2019   VD25OH 27.51 (L) 02/14/2018   Lab Results  Component Value Date   WBC 6.2 12/25/2020   HGB 14.3 12/25/2020   HCT 41.9 12/25/2020   MCV 88.3 12/25/2020   PLT 260.0 12/25/2020    Attestation Statements:   Reviewed by clinician on day of visit: allergies, medications, problem list, medical history, surgical history, family history, social history, and previous encounter notes.  Time spent on visit including pre-visit chart review and post-visit care and charting was 42 minutes.   Edmund Hilda, CMA, am acting as transcriptionist for [employer], DO.  I have reviewed the above documentation for accuracy and completeness, and I agree with the above. Carlye Grippe, D.O.  The 21st Century Cures Act was signed into law in 2016 which includes the topic of electronic health records.  This provides immediate access to information in MyChart.  This includes consultation notes, operative notes, office notes, lab results and pathology reports.  If you have any questions about what you read please let us know at your next visit so we can discuss your concerns and take corrective action if need be.  We are right here with you.

## 2021-09-17 ENCOUNTER — Other Ambulatory Visit: Payer: Self-pay | Admitting: Obstetrics and Gynecology

## 2021-09-17 DIAGNOSIS — Z9189 Other specified personal risk factors, not elsewhere classified: Secondary | ICD-10-CM

## 2021-09-22 ENCOUNTER — Encounter: Payer: Self-pay | Admitting: Physician Assistant

## 2021-09-23 ENCOUNTER — Other Ambulatory Visit: Payer: Self-pay

## 2021-09-23 ENCOUNTER — Other Ambulatory Visit: Payer: Self-pay | Admitting: Physician Assistant

## 2021-09-23 DIAGNOSIS — E7849 Other hyperlipidemia: Secondary | ICD-10-CM

## 2021-09-23 DIAGNOSIS — K580 Irritable bowel syndrome with diarrhea: Secondary | ICD-10-CM

## 2021-09-23 MED ORDER — VIBERZI 75 MG PO TABS: 1.0000 | ORAL_TABLET | Freq: Two times a day (BID) | ORAL | 3 refills | Status: DC

## 2021-09-23 MED ORDER — COLESTIPOL HCL 1 G PO TABS: 1.0000 g | ORAL_TABLET | Freq: Two times a day (BID) | ORAL | 3 refills | Status: DC

## 2021-09-23 NOTE — Progress Notes (Signed)
Reviewed and agree with documentation and assessment and plan. K. Veena Vanassa Penniman , MD   

## 2021-09-24 ENCOUNTER — Encounter (INDEPENDENT_AMBULATORY_CARE_PROVIDER_SITE_OTHER): Payer: Self-pay | Admitting: Adult Health

## 2021-09-24 ENCOUNTER — Ambulatory Visit (INDEPENDENT_AMBULATORY_CARE_PROVIDER_SITE_OTHER): Payer: 59 | Admitting: Adult Health

## 2021-09-24 ENCOUNTER — Other Ambulatory Visit: Payer: Self-pay

## 2021-09-24 VITALS — BP 111/73 | HR 81 | Temp 97.7°F | Ht 61.0 in | Wt 160.0 lb

## 2021-09-24 DIAGNOSIS — E7800 Pure hypercholesterolemia, unspecified: Secondary | ICD-10-CM

## 2021-09-24 DIAGNOSIS — Z6830 Body mass index (BMI) 30.0-30.9, adult: Secondary | ICD-10-CM

## 2021-09-24 DIAGNOSIS — E669 Obesity, unspecified: Secondary | ICD-10-CM | POA: Diagnosis not present

## 2021-09-24 DIAGNOSIS — E8881 Metabolic syndrome: Secondary | ICD-10-CM

## 2021-09-28 DIAGNOSIS — E8881 Metabolic syndrome: Secondary | ICD-10-CM | POA: Insufficient documentation

## 2021-09-28 DIAGNOSIS — E88819 Insulin resistance, unspecified: Secondary | ICD-10-CM | POA: Insufficient documentation

## 2021-09-28 NOTE — Progress Notes (Signed)
Chief Complaint:   OBESITY Kim Clements is here to discuss her progress with her obesity treatment plan along with follow-up of her obesity related diagnoses. Kim Clements is on the Category 2 Plan and keeping a food journal and adhering to recommended goals of 1000-1200 calories and 80 grams of protein and states she is following her eating plan approximately 95% of the time. Kim Clements states she is not exercising regularly at this time.  Today's visit was #: 6 Starting weight: 170 lbs Starting date: 07/07/2021 Today's weight: 160 lbs Today's date: 09/24/2021 Total lbs lost to date: 10 lbs Total lbs lost since last in-office visit: 2 lbs  Interim History:  Kim Clements's 2023 Health/Wellness Goals: 1) To become the healthiest version of myself. 2) Continue to lose weight.  She continues to enjoy the foods/structure of Cat 2 Meal Plan.  Of note:  Husband at bedside during office visit.  Subjective:   1. Insulin resistance On 07/07/2021, Insulin level 9.4 with normal A1c of 5.5.  2. Pure hypercholesterolemia Unable to tolerate pravastatin. PCP referred her to [facility]- she has yet to establish as new pt. She denies vape/tobacco use. She denies acute cardiac sx's. On 07/07/2021, lipid panel - LDL 161.  Assessment/Plan:   1. Insulin resistance Check labs at next office visit.  2. Pure hypercholesterolemia Follow-up with [facility], decrease saturated fat, increase walking.  3. Obesity with current BMI of 30.3  Kim Clements is currently in the action stage of change. As such, her goal is to continue with weight loss efforts. She has agreed to the Category 2 Plan and keeping a food journal and adhering to recommended goals of 1000-1200 calories and 80 grams of protein.   Check fasting labs middle February 2023.  Exercise goals:  NEAT activities.  Behavioral modification strategies: increasing lean protein intake, decreasing simple carbohydrates, meal planning and  cooking strategies, keeping healthy foods in the home, and planning for success.   Kim Clements has agreed to follow-up with our clinic in 2 weeks. She was informed of the importance of frequent follow-up visits to maximize her success with intensive lifestyle modifications for her multiple health conditions.   Objective:   Blood pressure 111/73, pulse 81, temperature 97.7 F (36.5 C), height 5\' 1"  (1.549 m), weight 160 lb (72.6 kg), SpO2 100 %. Body mass index is 30.23 kg/m.  General: Cooperative, alert, well developed, in no acute distress. HEENT: Conjunctivae and lids unremarkable. Cardiovascular: Regular rhythm.  Lungs: Normal work of breathing. Neurologic: No focal deficits.   Lab Results  Component Value Date   CREATININE 0.84 07/07/2021   BUN 11 07/07/2021   NA 143 07/07/2021   K 4.6 07/07/2021   CL 104 07/07/2021   CO2 24 07/07/2021   Lab Results  Component Value Date   ALT 35 (H) 07/07/2021   AST 19 07/07/2021   ALKPHOS 87 07/07/2021   BILITOT 0.4 07/07/2021   Lab Results  Component Value Date   HGBA1C 5.5 07/07/2021   Lab Results  Component Value Date   INSULIN 9.4 07/07/2021   Lab Results  Component Value Date   TSH 2.05 12/25/2020   Lab Results  Component Value Date   CHOL 234 (H) 07/07/2021   HDL 51 07/07/2021   LDLCALC 161 (H) 07/07/2021   TRIG 125 07/07/2021   CHOLHDL 4 12/25/2020   Lab Results  Component Value Date   VD25OH 35.0 07/07/2021   VD25OH 38.98 11/30/2019   VD25OH 27.51 (L) 02/14/2018   Lab Results  Component Value Date   WBC 6.2 12/25/2020   HGB 14.3 12/25/2020   HCT 41.9 12/25/2020   MCV 88.3 12/25/2020   PLT 260.0 12/25/2020   Attestation Statements:   Reviewed by clinician on day of visit: allergies, medications, problem list, medical history, surgical history, family history, social history, and previous encounter notes.  Time spent on visit including pre-visit chart review and post-visit care and charting was 30  minutes.   I, Insurance claims handler, CMA, am acting as Energy manager for William Hamburger, NP.  I have reviewed the above documentation for accuracy and completeness, and I agree with the above. - Thomasenia Dowse d. Laticha Ferrucci, NP-C

## 2021-10-08 ENCOUNTER — Ambulatory Visit (INDEPENDENT_AMBULATORY_CARE_PROVIDER_SITE_OTHER): Payer: 59 | Admitting: Family Medicine

## 2021-10-08 ENCOUNTER — Encounter (INDEPENDENT_AMBULATORY_CARE_PROVIDER_SITE_OTHER): Payer: Self-pay | Admitting: Family Medicine

## 2021-10-08 ENCOUNTER — Other Ambulatory Visit: Payer: Self-pay

## 2021-10-08 VITALS — BP 122/76 | HR 77 | Temp 98.1°F | Ht 61.0 in | Wt 159.0 lb

## 2021-10-08 DIAGNOSIS — Z6830 Body mass index (BMI) 30.0-30.9, adult: Secondary | ICD-10-CM

## 2021-10-08 DIAGNOSIS — K580 Irritable bowel syndrome with diarrhea: Secondary | ICD-10-CM

## 2021-10-08 DIAGNOSIS — Z6832 Body mass index (BMI) 32.0-32.9, adult: Secondary | ICD-10-CM

## 2021-10-08 DIAGNOSIS — E7800 Pure hypercholesterolemia, unspecified: Secondary | ICD-10-CM | POA: Diagnosis not present

## 2021-10-08 DIAGNOSIS — E669 Obesity, unspecified: Secondary | ICD-10-CM | POA: Diagnosis not present

## 2021-10-08 DIAGNOSIS — F411 Generalized anxiety disorder: Secondary | ICD-10-CM

## 2021-10-13 ENCOUNTER — Other Ambulatory Visit: Payer: Self-pay | Admitting: Physician Assistant

## 2021-10-13 NOTE — Progress Notes (Signed)
Chief Complaint:   OBESITY Kim Clements is here to discuss her progress with her obesity treatment plan along with follow-up of her obesity related diagnoses. See Medical Weight Management Flowsheet for complete bioelectrical impedance results.  Today's visit was #: 7 Starting weight: 170 lbs Starting date: 07/07/2021 Weight change since last visit: 1 lb Total lbs lost to date: 11 lbs Total weight loss percentage to date: -6.47%  Nutrition Plan: Category 2 Plan and keeping a food journal and adhering to recommended goals of 1000-1200 calories and 80 grams of protein daily for 90-95% of the time. Activity: None.  Interim History: Kim Clements is maintaining the plan well.  She says that when she is off plan, she is focused on protein and vegetables.  She is down to 12 ounces of sweet tea daily and is drinking it mindfully in one sitting.  Assessment/Plan:   1. Pure hypercholesterolemia Course: Not at goal. Lipid-lowering medications: None.   Plan: Dietary changes: Increase soluble fiber, decrease simple carbohydrates, decrease saturated fat. Exercise changes: Moderate to vigorous-intensity aerobic activity 150 minutes per week or as tolerated. We will continue to monitor along with PCP as it pertains to her weight loss journey.  Lab Results  Component Value Date   CHOL 234 (H) 07/07/2021   HDL 51 07/07/2021   LDLCALC 161 (H) 07/07/2021   TRIG 125 07/07/2021   CHOLHDL 4 12/25/2020   Lab Results  Component Value Date   ALT 35 (H) 07/07/2021   AST 19 07/07/2021   ALKPHOS 87 07/07/2021   BILITOT 0.4 07/07/2021   The 10-year ASCVD risk score (Arnett DK, et al., 2019) is: 2.5%   Values used to calculate the score:     Age: 56 years     Sex: Female     Is Non-Hispanic African American: No     Diabetic: No     Tobacco smoker: No     Systolic Blood Pressure: 123XX123 mmHg     Is BP treated: No     HDL Cholesterol: 51 mg/dL     Total Cholesterol: 234 mg/dL  2. Irritable bowel  syndrome with diarrhea The cause is not completely understood, but is likely to be a combination of genetics, diet, stress, visceral hypersensitivity and the gut microbiome.  The available treatments aim to control symptoms even if unable to "cure" the condition.  While not physically harmful, IBS can have a significant impact on quality of life.  3. GAD (generalized anxiety disorder) Improved. Specifically regarding patient's less desirable eating habits and patterns, we employed the technique of small changes when she cannot fully commit to her prudent nutritional plan.  4. Obesity with current BMI of 30.1  Course: Kim Clements is currently in the action stage of change. As such, her goal is to continue with weight loss efforts.   Nutrition goals: She has agreed to the Category 2 Plan and keeping a food journal and adhering to recommended goals of 1000-1200 calories and 80 grams of protein.   Exercise goals:  Increase NEAT.  Behavioral modification strategies: increasing lean protein intake, decreasing simple carbohydrates, increasing vegetables, increasing water intake, and decreasing liquid calories.  Kim Clements has agreed to follow-up with our clinic in 4 weeks. She was informed of the importance of frequent follow-up visits to maximize her success with intensive lifestyle modifications for her multiple health conditions.   Objective:   Blood pressure 122/76, pulse 77, temperature 98.1 F (36.7 C), temperature source Oral, height 5\' 1"  (1.549 m), weight 159  lb (72.1 kg), SpO2 100 %. Body mass index is 30.04 kg/m.  General: Cooperative, alert, well developed, in no acute distress. HEENT: Conjunctivae and lids unremarkable. Cardiovascular: Regular rhythm.  Lungs: Normal work of breathing. Neurologic: No focal deficits.   Lab Results  Component Value Date   CREATININE 0.84 07/07/2021   BUN 11 07/07/2021   NA 143 07/07/2021   K 4.6 07/07/2021   CL 104 07/07/2021   CO2 24 07/07/2021    Lab Results  Component Value Date   ALT 35 (H) 07/07/2021   AST 19 07/07/2021   ALKPHOS 87 07/07/2021   BILITOT 0.4 07/07/2021   Lab Results  Component Value Date   HGBA1C 5.5 07/07/2021   Lab Results  Component Value Date   INSULIN 9.4 07/07/2021   Lab Results  Component Value Date   TSH 2.05 12/25/2020   Lab Results  Component Value Date   CHOL 234 (H) 07/07/2021   HDL 51 07/07/2021   LDLCALC 161 (H) 07/07/2021   TRIG 125 07/07/2021   CHOLHDL 4 12/25/2020   Lab Results  Component Value Date   VD25OH 35.0 07/07/2021   VD25OH 38.98 11/30/2019   VD25OH 27.51 (L) 02/14/2018   Lab Results  Component Value Date   WBC 6.2 12/25/2020   HGB 14.3 12/25/2020   HCT 41.9 12/25/2020   MCV 88.3 12/25/2020   PLT 260.0 12/25/2020   Attestation Statements:   Reviewed by clinician on day of visit: allergies, medications, problem list, medical history, surgical history, family history, social history, and previous encounter notes.  I, Water quality scientist, CMA, am acting as transcriptionist for Briscoe Deutscher, DO  I have reviewed the above documentation for accuracy and completeness, and I agree with the above. -  Briscoe Deutscher, DO, MS, FAAFP, DABOM - Family and Bariatric Medicine.

## 2021-10-16 ENCOUNTER — Telehealth: Payer: Self-pay

## 2021-10-16 ENCOUNTER — Other Ambulatory Visit: Payer: Self-pay | Admitting: Physician Assistant

## 2021-10-16 NOTE — Telephone Encounter (Signed)
Alyson Locket "[name]"  [facility] Clinical Pool Phone Number: [phone]   Hi,  I took the generic form of Librax (Chlordiazepoxide-Clidinium Cap) for many years and did very well on it.  (I took 1 capsule 3 times/day) have not had it in quite awhile because [facility] was not able to get it.  They now have it, and I would like to have it again.  They sent in a refill request for it on Tuesday, 10/13/21, and as of today they have not heard back from yall yet.  I just called the office and they dont know what the latest is with it and dont know when yall will be approving the refill/getting back to [facility].    Please let me know what the latest is, and if I can go back on this med.     Thank you so much!  Damiana Wehrmann     This patient previously saw Janett Billow. Then she was scheduled with you. She is a [facility] patient. I am not sure who to ask.

## 2021-10-16 NOTE — Telephone Encounter (Signed)
Is Librax a medication this patient can have? She has sent a patient advise request inquiring about it. Reports she was on it for years with positive results and control of her IBS symptoms.

## 2021-10-19 NOTE — Telephone Encounter (Signed)
Message sent to the patient to advise her and to offer help with scheduling.

## 2021-10-19 NOTE — Telephone Encounter (Signed)
The patient is on Viberzi. I asked her if she wanted Librax to replace Viberzi.  She told me she was not sure. Phone Number: [phone]   "I dont know?  I just know I was on it for many years (until it was not available to get in late 2020) and did well on it.  I think Ive been on Viberzi since before then, but I dont remember exactly when I started taking it.  Part of me would like to get off the Viberzi but part of me is afraid to rock the boat and stop any medications right now.    Thanks."

## 2021-10-19 NOTE — Telephone Encounter (Signed)
Please schedule office follow-up visit to discuss medication management so patient can understand the benefits and risk associated with the meds.  Thanks

## 2021-10-22 ENCOUNTER — Ambulatory Visit (INDEPENDENT_AMBULATORY_CARE_PROVIDER_SITE_OTHER): Payer: 59 | Admitting: Adult Health

## 2021-10-22 ENCOUNTER — Other Ambulatory Visit: Payer: Self-pay

## 2021-10-22 ENCOUNTER — Encounter (INDEPENDENT_AMBULATORY_CARE_PROVIDER_SITE_OTHER): Payer: Self-pay | Admitting: Adult Health

## 2021-10-22 VITALS — BP 130/86 | HR 79 | Temp 98.2°F | Ht 61.0 in | Wt 157.0 lb

## 2021-10-22 DIAGNOSIS — E669 Obesity, unspecified: Secondary | ICD-10-CM | POA: Diagnosis not present

## 2021-10-22 DIAGNOSIS — E8881 Metabolic syndrome: Secondary | ICD-10-CM

## 2021-10-22 DIAGNOSIS — E66811 Obesity, class 1: Secondary | ICD-10-CM

## 2021-10-22 DIAGNOSIS — E7849 Other hyperlipidemia: Secondary | ICD-10-CM

## 2021-10-22 DIAGNOSIS — Z9189 Other specified personal risk factors, not elsewhere classified: Secondary | ICD-10-CM

## 2021-10-22 DIAGNOSIS — Z6829 Body mass index (BMI) 29.0-29.9, adult: Secondary | ICD-10-CM

## 2021-10-22 DIAGNOSIS — E88819 Insulin resistance, unspecified: Secondary | ICD-10-CM

## 2021-10-22 DIAGNOSIS — E559 Vitamin D deficiency, unspecified: Secondary | ICD-10-CM

## 2021-10-22 NOTE — Progress Notes (Signed)
Chief Complaint:   OBESITY Kim Clements is here to discuss her progress with her obesity treatment plan along with follow-up of her obesity related diagnoses. Kim Clements is on the Category 2 Plan and keeping a food journal and adhering to recommended goals of 1000-1200 calories and 80 grams of protein and states she is following her eating plan approximately 95% of the time. Kim Clements states she is not exercising regularly at this time.  Today's visit was #: 8 Starting weight: 170 lbs Starting date: 07/07/2021 Today's weight: 157 lbs Today's date: 10/22/2021 Total lbs lost to date: 13 lbs Total lbs lost since last in-office visit: 2 lbs  Interim History:  Kim Clements follows Category 2 and tracks intake. She says that at first it was difficult to consume all prescribed foods on plan. Recently she has experienced a surge of polyphagia the last several weeks.  Subjective:   1. Vitamin D deficiency She is on OTC vitamin D3 1000 x5 = 5000 IU daily.  2. Insulin resistance Recent increase in polyphagia, even when following Cat 2 meal >95%.  3. Other hyperlipidemia Statin intolerant - severe myalgias. Mother had MI at age 66.  Father had CVA in 2018. Mother/father/brother -HLD, her brother is on statin therapy.  4. At risk for heart disease Mylisa is at higher than average risk for cardiovascular disease due to HLD and IR.  Assessment/Plan:   1. Vitamin D deficiency Check vitamin D level today.  - VITAMIN D 25 Hydroxy (Vit-D Deficiency, Fractures)  2. Insulin resistance Check labs today.  - Comprehensive metabolic panel - Hemoglobin A1c - Insulin, random  3. Other hyperlipidemia Check labs.  - Lipid panel  4. At risk for heart disease Kim Clements was given approximately 15 minutes of coronary artery disease prevention counseling today. She is 56 y.o. female and has risk factors for heart disease including obesity. We discussed intensive lifestyle modifications today  with an emphasis on specific weight loss instructions and strategies.  Kim Clements spaced learning was employed today to elicit superior memory formation and behavioral change.   5. Obesity with current BMI of 29.8  Kim Clements is currently in the action stage of change. As such, her goal is to continue with weight loss efforts. She has agreed to the Category 2 Plan.   Check IC in 2 office visits.  Patient aware to arrive 30 minutes prior to office visit.  Increase protein at lunch to 6 ounces and at dinner to 8-10 ounces- to counter polyphagia.  Exercise goals:  As is.  Behavioral modification strategies: increasing lean protein intake, decreasing simple carbohydrates, meal planning and cooking strategies, keeping healthy foods in the home, planning for success, and keeping a strict food journal.  Kim Clements has agreed to follow-up with our clinic in 2-3 weeks. She was informed of the importance of frequent follow-up visits to maximize her success with intensive lifestyle modifications for her multiple health conditions.   Kim Clements was informed we would discuss her lab results at her next visit unless there is a critical issue that needs to be addressed sooner. Kim Clements agreed to keep her next visit at the agreed upon time to discuss these results.  Objective:   Blood pressure 130/86, pulse 79, temperature 98.2 F (36.8 C), height 5\' 1"  (1.549 m), weight 157 lb (71.2 kg), SpO2 99 %. Body mass index is 29.66 kg/m.  General: Cooperative, alert, well developed, in no acute distress. HEENT: Conjunctivae and lids unremarkable. Cardiovascular: Regular rhythm.  Lungs: Normal work of breathing. Neurologic: No  focal deficits.   Lab Results  Component Value Date   CREATININE 0.84 07/07/2021   BUN 11 07/07/2021   NA 143 07/07/2021   K 4.6 07/07/2021   CL 104 07/07/2021   CO2 24 07/07/2021   Lab Results  Component Value Date   ALT 35 (H) 07/07/2021   AST 19 07/07/2021   ALKPHOS 87  07/07/2021   BILITOT 0.4 07/07/2021   Lab Results  Component Value Date   HGBA1C 5.5 07/07/2021   Lab Results  Component Value Date   INSULIN 9.4 07/07/2021   Lab Results  Component Value Date   TSH 2.05 12/25/2020   Lab Results  Component Value Date   CHOL 234 (H) 07/07/2021   HDL 51 07/07/2021   LDLCALC 161 (H) 07/07/2021   TRIG 125 07/07/2021   CHOLHDL 4 12/25/2020   Lab Results  Component Value Date   VD25OH 35.0 07/07/2021   VD25OH 38.98 11/30/2019   VD25OH 27.51 (L) 02/14/2018   Lab Results  Component Value Date   WBC 6.2 12/25/2020   HGB 14.3 12/25/2020   HCT 41.9 12/25/2020   MCV 88.3 12/25/2020   PLT 260.0 12/25/2020   Attestation Statements:   Reviewed by clinician on day of visit: allergies, medications, problem list, medical history, surgical history, family history, social history, and previous encounter notes.  I, Insurance claims handler, CMA, am acting as Energy manager for William Hamburger, NP.  I have reviewed the above documentation for accuracy and completeness, and I agree with the above. -  Tahirih Lair d. Giannie Soliday, NP-C

## 2021-10-23 LAB — LIPID PANEL
Chol/HDL Ratio: 4.2 ratio (ref 0.0–4.4)
Cholesterol, Total: 201 mg/dL — ABNORMAL HIGH (ref 100–199)
HDL: 48 mg/dL (ref >39)
LDL Chol Calc (NIH): 135 mg/dL — ABNORMAL HIGH (ref 0–99)
Triglycerides: 102 mg/dL (ref 0–149)
VLDL Cholesterol Cal: 18 mg/dL (ref 5–40)

## 2021-10-23 LAB — COMPREHENSIVE METABOLIC PANEL
ALT: 24 IU/L (ref 0–32)
AST: 14 IU/L (ref 0–40)
Albumin/Globulin Ratio: 2.4 — ABNORMAL HIGH (ref 1.2–2.2)
Albumin: 4.6 g/dL (ref 3.8–4.9)
Alkaline Phosphatase: 77 IU/L (ref 44–121)
BUN/Creatinine Ratio: 22 (ref 9–23)
BUN: 18 mg/dL (ref 6–24)
Bilirubin Total: 0.4 mg/dL (ref 0.0–1.2)
CO2: 22 mmol/L (ref 20–29)
Calcium: 9.6 mg/dL (ref 8.7–10.2)
Chloride: 107 mmol/L — ABNORMAL HIGH (ref 96–106)
Creatinine, Ser: 0.83 mg/dL (ref 0.57–1.00)
Globulin, Total: 1.9 g/dL (ref 1.5–4.5)
Glucose: 100 mg/dL — ABNORMAL HIGH (ref 70–99)
Potassium: 4.5 mmol/L (ref 3.5–5.2)
Sodium: 144 mmol/L (ref 134–144)
Total Protein: 6.5 g/dL (ref 6.0–8.5)
eGFR: 83 mL/min/{1.73_m2} (ref >59)

## 2021-10-23 LAB — INSULIN, RANDOM: INSULIN: 9 u[IU]/mL (ref 2.6–24.9)

## 2021-10-23 LAB — VITAMIN D 25 HYDROXY (VIT D DEFICIENCY, FRACTURES): Vit D, 25-Hydroxy: 61.5 ng/mL (ref 30.0–100.0)

## 2021-10-23 LAB — HEMOGLOBIN A1C
Est. average glucose Bld gHb Est-mCnc: 117 mg/dL
Hgb A1c MFr Bld: 5.7 % — ABNORMAL HIGH (ref 4.8–5.6)

## 2021-11-11 ENCOUNTER — Ambulatory Visit (INDEPENDENT_AMBULATORY_CARE_PROVIDER_SITE_OTHER): Payer: 59 | Admitting: Family Medicine

## 2021-11-23 ENCOUNTER — Ambulatory Visit: Payer: 59 | Admitting: Gastroenterology

## 2021-12-01 ENCOUNTER — Ambulatory Visit: Payer: 59 | Admitting: Gastroenterology

## 2021-12-01 ENCOUNTER — Ambulatory Visit (INDEPENDENT_AMBULATORY_CARE_PROVIDER_SITE_OTHER): Payer: 59 | Admitting: Gastroenterology

## 2021-12-01 ENCOUNTER — Encounter: Payer: Self-pay | Admitting: Gastroenterology

## 2021-12-01 VITALS — BP 108/74 | HR 75 | Ht 61.0 in | Wt 160.2 lb

## 2021-12-01 DIAGNOSIS — K9089 Other intestinal malabsorption: Secondary | ICD-10-CM

## 2021-12-01 DIAGNOSIS — E739 Lactose intolerance, unspecified: Secondary | ICD-10-CM | POA: Diagnosis not present

## 2021-12-01 DIAGNOSIS — K580 Irritable bowel syndrome with diarrhea: Secondary | ICD-10-CM

## 2021-12-01 MED ORDER — COLESTIPOL HCL 1 G PO TABS: 1.0000 g | ORAL_TABLET | Freq: Two times a day (BID) | ORAL | 3 refills | Status: DC

## 2021-12-01 MED ORDER — CILIDINIUM-CHLORDIAZEPOXIDE 2.5-5 MG PO CAPS: 1.0000 | ORAL_CAPSULE | Freq: Three times a day (TID) | ORAL | 3 refills | Status: DC

## 2021-12-01 NOTE — Progress Notes (Signed)
? ?       ? ?Kim Clements    [id number]    Jan 26, 1966 ? ?Primary Care Physician:Allwardt, Crist Infante, PA-C ? ?Referring Physician: Allwardt, Crist Infante, PA-C ?[address] ?[place],  [place] [address] ? ? ?Chief complaint:  IBS ? ?HPI: ? ?56 year old very pleasant female with history of irritable bowel syndrome predominant diarrhea here for follow-up visit ?She is taking Viberzi 75 mg twice daily and also Colestid 1 g daily.  She is having overall improvement of symptoms with occasional diarrhea once every few months.  She is avoiding lactose.  Denies abdominal pain, nausea, vomiting, dysphagia, loss of appetite or weight loss. ?Overall she feels well and has no specific GI complaints.  She would like to use Librax given it is available again, she wants to stop using Viberzi ?  ?Colonoscopy February 12, 2016 showed sigmoid diverticulosis internal hemorrhoids otherwise normal exam ?  ?Colonoscopy Jan 09, 2010 by Dr. Ewing Schlein at [facility] sigmoid diverticulosis otherwise normal exam ?  ?Colonoscopy October 20 2004 by Dr. [facility].  Terminal ileum biopsies normal mucosa.  2 small polypoid appearing polyps were removed with biopsy forceps showed benign colonic mucosa.  Sigmoid diverticulosis ?  ? ? ?Outpatient Encounter Medications as of 12/01/2021  ?Medication Sig  ? AMBULATORY NON FORMULARY MEDICATION Allergy Shots ?2 shots per week  ? amitriptyline (ELAVIL) 10 MG tablet TAKE 1 TABLET BY MOUTH EVERY DAY AT NIGHT  ? bismuth subsalicylate (PEPTO BISMOL) 262 MG/15ML suspension Take 30 mLs by mouth as needed.  ? calcium carbonate (TUMS - DOSED IN MG ELEMENTAL CALCIUM) 500 MG chewable tablet Chew 1 tablet by mouth daily.  ? Cholecalciferol (VITAMIN D3) 25 MCG (1000 UT) CAPS Take 5 capsules by mouth daily.  ? colestipol (COLESTID) 1 g tablet Take 1 tablet (1 g total) by mouth 2 (two) times daily.  ? Efinaconazole (JUBLIA) 10 % SOLN Apply 1 application topically daily.  ? Eluxadoline (VIBERZI) 75 MG TABS Take 1  tablet by mouth 2 (two) times daily.  ? fexofenadine (ALLEGRA) 180 MG tablet Take 180 mg by mouth daily.  ? Loperamide HCl (IMODIUM A-D PO) Take by mouth as needed.  ? Simethicone (GAS-X PO) Take by mouth as needed.  ? ?No facility-administered encounter medications on file as of 12/01/2021.  ? ? ?Allergies as of 12/01/2021 - Review Complete 12/01/2021  ?Allergen Reaction Noted  ? Erythromycin Rash 08/22/2015  ? Penicillins Hives 08/22/2015  ? Sulfa antibiotics Rash 08/22/2015  ? ? ?Past Medical History:  ?Diagnosis Date  ? Anal fissure   ? Anxiety   ? Arthritis   ? Asthma   ? Back pain   ? Colon polyps   ? Diverticulosis   ? Edema, lower extremity   ? GERD (gastroesophageal reflux disease)   ? HLD (hyperlipidemia)   ? IBS (irritable bowel syndrome)   ? Joint pain   ? Lactose intolerance   ? Osteoarthritis   ? Vitamin D deficiency   ? ? ?Past Surgical History:  ?Procedure Laterality Date  ? ABDOMINAL HYSTERECTOMY  2007  ? LAPAROSCOPY  1988  ? Laser. Indication: Endometriosis.  ? TONSILLECTOMY  1971  ? ? ?Family History  ?Problem Relation Age of Onset  ? Colon polyps Mother   ? Heart disease Mother   ? Rheum arthritis Mother   ? Osteoarthritis Mother   ? High blood pressure Mother   ? Anxiety disorder Mother   ? Colon polyps Father   ? Stroke Father   ?  High blood pressure Father   ? High Cholesterol Father   ? Heart disease Father   ? Heart disease Maternal Grandmother   ? Heart disease Maternal Grandfather   ? Breast cancer Paternal Grandmother   ?     all of paternal females  ? Prostate cancer Paternal Grandfather   ? Heart disease Paternal Grandfather   ? Breast cancer Paternal Aunt   ? Colon cancer Neg Hx   ? ? ?Social History  ? ?Socioeconomic History  ? Marital status: Married  ?  Spouse name: Not on file  ? Number of children: 1  ? Years of education: Not on file  ? Highest education level: Not on file  ?Occupational History  ? 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 Occupation: stay at home mom, retired  ?Tobacco Use   ? Smoking status: Never  ? Smokeless tobacco: Never  ?Vaping Use  ? Vaping Use: Never used  ?Substance and Sexual Activity  ? Alcohol use: No  ?  Alcohol/week: 0.0 standard drinks  ? Drug use: No  ? Sexual activity: Not on file  ?Other Topics Concern  ? Not on file  ?Social History Narrative  ? Not on file  ? ?Social Determinants of Health  ? ?Financial Resource Strain: Not on file  ?Food Insecurity: Not on file  ?Transportation Needs: Not on file  ?Physical Activity: Not on file  ?Stress: Not on file  ?Social Connections: Not on file  ?Intimate Partner Violence: Not on file  ? ? ? ? ?Review of systems: ?All other review of systems negative except as mentioned in the HPI. ? ? ?Physical Exam: ?Vitals:  ? 12/01/21 1419  ?BP: 108/74  ?Pulse: 75  ?SpO2: 98%  ? ?Body mass index is 30.28 kg/m?. ?Gen:      No acute distress ?Neuro: alert and oriented x 3 ?Psych: normal mood and affect ? ?Data Reviewed: ? ?Reviewed labs, radiology imaging, old records and pertinent past GI work up ? ? ?Assessment and Plan/Recommendations: ? ?56 year old very pleasant female with irritable bowel syndrome predominant diarrhea here for follow-up visit ?  ?IBS-D:  ?Patient is interested to switching to Librax, will send prescription for Librax 1 capsule up to 3 times daily as needed.  Advised patient to avoid using it continuously and only use it as needed ?Stop Viberzi to 75 mg twice daily. ?Continue Colestid 1 g daily at bedtime for bile induced diarrhea ?Continue lactose-free diet  ?  ?Due for colonoscopy for surveillance in 2027 ?  ?Return in 6 months or sooner if needed ? ?This visit required 30 minutes of patient care (this includes precharting, chart review, review of results, face-to-face time used for counseling as well as treatment plan and follow-up. The patient was provided an opportunity to ask questions and all were answered. The patient agreed with the plan and demonstrated an understanding of the instructions. ? ?K. Scherry Ran , MD ?  ? ?CC: Allwardt, Alyssa M, PA-C ? ? ?

## 2021-12-01 NOTE — Patient Instructions (Addendum)
We have sent the following medications to your pharmacy for you to pick up at your convenience:  Librax  ? ?STOP Viberzi  ? ?Continue Colestid  ? ?If you are age 57 or older, your body mass index should be between 23-30. Your Body mass index is 30.28 kg/m?Marland Kitchen If this is out of the aforementioned range listed, please consider follow up with your Primary Care Provider. ? ?If you are age 67 or younger, your body mass index should be between 19-25. Your Body mass index is 30.28 kg/m?Marland Kitchen If this is out of the aformentioned range listed, please consider follow up with your Primary Care Provider.  ? ?________________________________________________________ ? ?The [facility] providers would like to encourage you to use [facility] to communicate with providers for non-urgent requests or questions.  Due to long hold times on the telephone, sending your provider a message by [facility] may be a faster and more efficient way to get a response.  Please allow 48 business hours for a response.  Please remember that this is for non-urgent requests.  ?_______________________________________________________  ? ?I appreciate the  opportunity to care for you ? ?Thank You  ? ?Harl Bowie , MD  ?

## 2021-12-02 ENCOUNTER — Encounter (INDEPENDENT_AMBULATORY_CARE_PROVIDER_SITE_OTHER): Payer: Self-pay | Admitting: Adult Health

## 2021-12-02 ENCOUNTER — Other Ambulatory Visit: Payer: Self-pay

## 2021-12-02 ENCOUNTER — Ambulatory Visit (INDEPENDENT_AMBULATORY_CARE_PROVIDER_SITE_OTHER): Payer: 59 | Admitting: Adult Health

## 2021-12-02 VITALS — BP 109/71 | HR 80 | Temp 98.1°F | Ht 61.0 in | Wt 154.0 lb

## 2021-12-02 DIAGNOSIS — E559 Vitamin D deficiency, unspecified: Secondary | ICD-10-CM | POA: Diagnosis not present

## 2021-12-02 DIAGNOSIS — E7849 Other hyperlipidemia: Secondary | ICD-10-CM

## 2021-12-02 DIAGNOSIS — E8881 Metabolic syndrome: Secondary | ICD-10-CM | POA: Diagnosis not present

## 2021-12-02 DIAGNOSIS — Z6829 Body mass index (BMI) 29.0-29.9, adult: Secondary | ICD-10-CM

## 2021-12-02 DIAGNOSIS — E669 Obesity, unspecified: Secondary | ICD-10-CM

## 2021-12-08 NOTE — Progress Notes (Signed)
? ? ? ?Chief Complaint:  ? ?OBESITY ?Kim Clements is here to discuss her progress with her obesity treatment plan along with follow-up of her obesity related diagnoses. Kim Clements is on the Category 2 Plan and states she is following her eating plan approximately 90-95% of the time. Kim Clements states she is not exercising regularly at this time. ? ?Today's visit was #: 9 ?Starting weight: 170 lbs ?Starting date: 07/07/2021 ?Today's weight: 154 lbs ?Today's date: 12/02/2021 ?Total lbs lost to date: 16 lbs ?Total lbs lost since last in-office visit: 3 lbs ? ?Interim History: On 07/07/2021, RMR 1613. ?On 12/02/2021, RMR 1426, lower metabolism at 187 calories.  ?7 day [employer] trip. ? ?Of Note- Husband at [facility] during OV. ? ?Subjective:  ? ?1. Other hyperlipidemia ?Discussed labs with patient today.  ?On 10/22/2021, lipid panel - total 201, LDL 135 - both above goal, however, both improved at last check.  HDL - 48 - at goal. ? ?2. Vitamin D deficiency ?Discussed labs with patient today.  ?She is on OTC vitamin D3 1000 - 5 capsules/day. ?On 10/22/2021 Vit D Level-61.5-stable ? ?3. Insulin resistance ?Discussed labs with patient today.  ?On 10/22/2021, CMP - GFR 83, A1c 5.7, insulin 9.0. ?She denies 1st degree family history of T2D.  She reports afternoon polyphagia. ?Discussed risks/benefits of metformin therapy. ? ?Assessment/Plan:  ? ?1. Other hyperlipidemia ?Increase walking. ? ?2. Vitamin D deficiency ?Finish 1000 5 capsules per day, then start OTC vitamin D3 5,000 IU daily. ? ?3. Insulin resistance ?Increase protein at lunch and snack time. ?Consider metformin initiation at next office visit if polyphagia persists. ? ?4. Obesity with current BMI of 29.2 ? ?Kim Clements is currently in the action stage of change. As such, her goal is to continue with weight loss efforts. She has agreed to the Category 1 Plan with 6 oz protein at lunch.  ? ?Convert from Category 2 to Category 1 due to decreased metabolism. ? ?Exercise goals:   Walk for 10 minutes 2 times per week. ? ?Behavioral modification strategies: increasing lean protein intake, decreasing simple carbohydrates, increasing vegetables, increasing water intake, meal planning and cooking strategies, keeping healthy foods in the home, and planning for success. ? ?Kim Clements has agreed to follow-up with our clinic in 3 weeks. She was informed of the importance of frequent follow-up visits to maximize her success with intensive lifestyle modifications for her multiple health conditions.  ? ?Objective:  ? ?Blood pressure 109/71, pulse 80, temperature 98.1 ?F (36.7 ?C), height 5\' 1"  (1.549 m), weight 154 lb (69.9 kg), SpO2 98 %. ?Body mass index is 29.1 kg/m?. ? ?General: Cooperative, alert, well developed, in no acute distress. ?HEENT: Conjunctivae and lids unremarkable. ?Cardiovascular: Regular rhythm.  ?Lungs: Normal work of breathing. ?Neurologic: No focal deficits.  ? ?Lab Results  ?Component Value Date  ? CREATININE 0.83 10/22/2021  ? BUN 18 10/22/2021  ? NA 144 10/22/2021  ? K 4.5 10/22/2021  ? CL 107 (H) 10/22/2021  ? CO2 22 10/22/2021  ? ?Lab Results  ?Component Value Date  ? ALT 24 10/22/2021  ? AST 14 10/22/2021  ? ALKPHOS 77 10/22/2021  ? BILITOT 0.4 10/22/2021  ? ?Lab Results  ?Component Value Date  ? HGBA1C 5.7 (H) 10/22/2021  ? HGBA1C 5.5 07/07/2021  ? ?Lab Results  ?Component Value Date  ? INSULIN 9.0 10/22/2021  ? INSULIN 9.4 07/07/2021  ? ?Lab Results  ?Component Value Date  ? TSH 2.05 12/25/2020  ? ?Lab Results  ?Component Value Date  ?  CHOL 201 (H) 10/22/2021  ? HDL 48 10/22/2021  ? LDLCALC 135 (H) 10/22/2021  ? TRIG 102 10/22/2021  ? CHOLHDL 4.2 10/22/2021  ? ?Lab Results  ?Component Value Date  ? VD25OH 61.5 10/22/2021  ? VD25OH 35.0 07/07/2021  ? VD25OH 38.98 11/30/2019  ? ?Lab Results  ?Component Value Date  ? WBC 6.2 12/25/2020  ? HGB 14.3 12/25/2020  ? HCT 41.9 12/25/2020  ? MCV 88.3 12/25/2020  ? PLT 260.0 12/25/2020  ? ?Attestation Statements:  ? ?Reviewed by  clinician on day of visit: allergies, medications, problem list, medical history, surgical history, family history, social history, and previous encounter notes. ? ?Time spent on visit including pre-visit chart review and post-visit care and charting was 40 minutes.  ? ?I, Insurance claims handler, CMA, am acting as Energy manager for William Hamburger, NP. ? ?I have reviewed the above documentation for accuracy and completeness, and I agree with the above. -  Marcy Bogosian d. Tycen Dockter, NP-C ?

## 2021-12-28 ENCOUNTER — Encounter (INDEPENDENT_AMBULATORY_CARE_PROVIDER_SITE_OTHER): Payer: Self-pay | Admitting: Adult Health

## 2021-12-28 ENCOUNTER — Ambulatory Visit (INDEPENDENT_AMBULATORY_CARE_PROVIDER_SITE_OTHER): Payer: 59 | Admitting: Adult Health

## 2021-12-28 VITALS — BP 110/74 | HR 81 | Temp 97.8°F | Ht 61.0 in | Wt 154.0 lb

## 2021-12-28 DIAGNOSIS — E8881 Metabolic syndrome: Secondary | ICD-10-CM

## 2021-12-28 DIAGNOSIS — Z6829 Body mass index (BMI) 29.0-29.9, adult: Secondary | ICD-10-CM | POA: Diagnosis not present

## 2021-12-28 DIAGNOSIS — Z6832 Body mass index (BMI) 32.0-32.9, adult: Secondary | ICD-10-CM

## 2021-12-28 DIAGNOSIS — E669 Obesity, unspecified: Secondary | ICD-10-CM

## 2021-12-31 ENCOUNTER — Encounter: Payer: Self-pay | Admitting: Physician Assistant

## 2021-12-31 ENCOUNTER — Ambulatory Visit (INDEPENDENT_AMBULATORY_CARE_PROVIDER_SITE_OTHER): Payer: 59 | Admitting: Physician Assistant

## 2021-12-31 VITALS — BP 100/70 | HR 81 | Temp 97.3°F | Ht 61.0 in | Wt 156.4 lb

## 2021-12-31 DIAGNOSIS — Z0000 Encounter for general adult medical examination without abnormal findings: Secondary | ICD-10-CM

## 2021-12-31 NOTE — Patient Instructions (Signed)
*  Talk to your pharmacy about Shingrix vaccines  ?

## 2021-12-31 NOTE — Progress Notes (Signed)
? ?Subjective:  ? ? Patient ID: Kim Clements, female    DOB: Jun 08, 1966, 56 y.o.   MRN: [id number] ? ?Chief Complaint  ?Patient presents with  ? Annual Exam  ?  No concerns today.  ? Irritable Bowel Syndrome  ? Gastroesophageal Reflux  ? ? ?Patient is in today for annual exam. ? ?Acute concerns: ?Lipid clinic first appt coming up in June ?Down 16 lbs since Nov 2022 working with [facility]  ? ?Health maintenance: ?Lifestyle/ exercise: Walking twice per week started a few weeks ago  ?Nutrition: Following plan with [facility], going well; 12 oz sweet tea ?Mental health: Doing well no concerns ?Caffeine: None o/w triggers IBS  ?Sleep: Off / on, sometimes has issues, changed after COVID-19 in Oct 2022. Approx 6 hrs/night.  ?Substance use: None ?ETOH: None ?Immunizations: Needs Shingrix ?Colonoscopy: UTD ?Pap: Follows with GYN - Dr. Huntley Dec  ?Mammogram: UTD, in-house  ?DEXA: She has had a few scans in her past, Dr. Huntley Dec manages  ? ? ?Past Medical History:  ?Diagnosis Date  ? Anal fissure   ? Anxiety   ? Arthritis   ? Asthma   ? Back pain   ? Colon polyps   ? Diverticulosis   ? Edema, lower extremity   ? GERD (gastroesophageal reflux disease)   ? HLD (hyperlipidemia)   ? IBS (irritable bowel syndrome)   ? Joint pain   ? Lactose intolerance   ? Osteoarthritis   ? Vitamin D deficiency   ? ? ?Past Surgical History:  ?Procedure Laterality Date  ? ABDOMINAL HYSTERECTOMY  2007  ? LAPAROSCOPY  1988  ? Laser. Indication: Endometriosis.  ? TONSILLECTOMY  1971  ? ? ?Family History  ?Problem Relation Age of Onset  ? Colon polyps Mother   ? Heart disease Mother   ? Rheum arthritis Mother   ? Osteoarthritis Mother   ? High blood pressure Mother   ? Anxiety disorder Mother   ? Colon polyps Father   ? Stroke Father   ? High blood pressure Father   ? High Cholesterol Father   ? Heart disease Father   ? Heart disease Maternal Grandmother   ? Heart disease Maternal Grandfather   ? Breast cancer Paternal Grandmother    ?     all of paternal females  ? Prostate cancer Paternal Grandfather   ? Heart disease Paternal Grandfather   ? Breast cancer Paternal Aunt   ? Colon cancer Neg Hx   ? ? ?Social History  ? ?Tobacco Use  ? Smoking status: Never  ? Smokeless tobacco: Never  ?Vaping Use  ? Vaping Use: Never used  ?Substance Use Topics  ? Alcohol use: No  ?  Alcohol/week: 0.0 standard drinks  ? Drug use: No  ?  ? ?Allergies  ?Allergen Reactions  ? Erythromycin Rash  ? Penicillins Hives  ? Sulfa Antibiotics Rash  ? ? ?Review of Systems ?NEGATIVE UNLESS OTHERWISE INDICATED IN HPI ? ? ?   ?Objective:  ?  ? ?BP 100/70   Pulse 81   Temp (!) 97.3 ?F (36.3 ?C) (Temporal)   Ht 5\' 1"  (1.549 m)   Wt 156 lb 6.4 oz (70.9 kg)   SpO2 98%   BMI 29.55 kg/m?  ? ?Wt Readings from Last 3 Encounters:  ?12/31/21 156 lb 6.4 oz (70.9 kg)  ?12/28/21 154 lb (69.9 kg)  ?12/02/21 154 lb (69.9 kg)  ? ? ?BP Readings from Last 3 Encounters:  ?12/31/21 100/70  ?12/28/21 110/74  ?  12/02/21 109/71  ?  ? ?Physical Exam ?Vitals and nursing note reviewed.  ?Constitutional:   ?   Appearance: Normal appearance. She is normal weight. She is not toxic-appearing.  ?HENT:  ?   Head: Normocephalic and atraumatic.  ?   Right Ear: Tympanic membrane, ear canal and external ear normal.  ?   Left Ear: Tympanic membrane, ear canal and external ear normal.  ?   Nose: Nose normal.  ?   Mouth/Throat:  ?   Mouth: Mucous membranes are moist.  ?Eyes:  ?   Extraocular Movements: Extraocular movements intact.  ?   Conjunctiva/sclera: Conjunctivae normal.  ?   Pupils: Pupils are equal, round, and reactive to light.  ?Cardiovascular:  ?   Rate and Rhythm: Normal rate and regular rhythm.  ?   Pulses: Normal pulses.  ?   Heart sounds: Normal heart sounds.  ?Pulmonary:  ?   Effort: Pulmonary effort is normal.  ?   Breath sounds: Normal breath sounds.  ?Abdominal:  ?   General: Abdomen is flat. Bowel sounds are normal.  ?   Palpations: Abdomen is soft.  ?Musculoskeletal:     ?   General:  Normal range of motion.  ?   Cervical back: Normal range of motion and neck supple.  ?Skin: ?   General: Skin is warm and dry.  ?Neurological:  ?   General: No focal deficit present.  ?   Mental Status: She is alert and oriented to person, place, and time.  ?Psychiatric:     ?   Mood and Affect: Mood normal.     ?   Behavior: Behavior normal.     ?   Thought Content: Thought content normal.     ?   Judgment: Judgment normal.  ? ? ?   ?Assessment & Plan:  ? ?Problem List Items Addressed This Visit   ?None ?Visit Diagnoses   ? ? Encounter for annual physical exam    -  Primary  ? ?  ? ?1. Encounter for annual physical exam ?Reviewed recent labs from [facility] ?She is doing very well with lifestyle changes - down 16 lb in 6 months! ?UTD on all preventive measures. ?Consider Shingrix vaccines. ?GYN and dermatology f/up as scheduled. ?Age-appropriate screening and counseling performed today.   ?Preventive measures discussed and printed in AVS for patient.  ? ?1 year or prn f/up ? ?This note was prepared with assistance of Conservation officer, historic buildings. Occasional wrong-word or sound-a-like substitutions may have occurred due to the inherent limitations of voice recognition software. ? ? ?Ladarrious Kirksey M Neema Fluegge, PA-C ?

## 2022-01-06 NOTE — Progress Notes (Signed)
? ? ? ?Chief Complaint:  ? ?OBESITY ?Kim Clements is here to discuss her progress with her obesity treatment plan along with follow-up of her obesity related diagnoses. Kim Clements is on the Category 1 Plan with 6 ounces of protein at lunch and states she is following her eating plan approximately 95% of the time. Kim Clements states she is walking for 15-20 minutes 1 times per week. ? ?Today's visit was #: 10 ?Starting weight: 170 lbs ?Starting date: 07/07/2021 ?Today's weight: 154 lbs ?Today's date: 12/28/2021 ?Total lbs lost to date: 16 lbs ?Total lbs lost since last in-office visit: 0 ? ?Interim History:  ?Kim Clements has reduced sweet tea intake to  8-12 ounces per day.  ?She has increased protein intake to 6 oz at lunch and dinner.  ?Her daughter (65) had dermatologic surgery and she has been focused on her well-being/recovery- which has lent to difficulty to meal planning/prepping, ? ?Of Note: her husband is at [facility] during OV ? ?Subjective:  ? ?1. Insulin resistance ?10/22/21: CMP GFR was 83, Insulin was 9.0.  ?We discussed risks/benefits of Metformin therapy.  ?She declined therapy at this time and prefers to utilize lifestyle modification. ? ?Assessment/Plan:  ? ?1. Insulin resistance ?Kim Clements will continue to increase protein and she will continue to increase walking. She will continue to work on weight loss, exercise, and decreasing simple carbohydrates to help decrease the risk of diabetes. Kim Clements agreed to follow-up with [place] as directed to closely monitor her progress. ? ?2. Obesity with current BMI of 29.2 ?Kim Clements is currently in the action stage of change. As such, her goal is to continue with weight loss efforts. She has agreed to the Category 1 Plan.  ? ?Kim Clements will continue to consume 6 ounces of protein at lunch and dinner.  ? ?Exercise goals:  Kim Clements will increase daily walking twice a week.  ? ?Behavioral modification strategies: increasing lean protein intake, decreasing simple carbohydrates,  keeping healthy foods in the home, and planning for success. ? ?Kim Clements has agreed to follow-up with our clinic in 3 weeks. She was informed of the importance of frequent follow-up visits to maximize her success with intensive lifestyle modifications for her multiple health conditions.  ? ?Objective:  ? ?Blood pressure 110/74, pulse 81, temperature 97.8 ?F (36.6 ?C), height 5\' 1"  (1.549 m), weight 154 lb (69.9 kg), SpO2 97 %. ?Body mass index is 29.1 kg/m?. ? ?General: Cooperative, alert, well developed, in no acute distress. ?HEENT: Conjunctivae and lids unremarkable. ?Cardiovascular: Regular rhythm.  ?Lungs: Normal work of breathing. ?Neurologic: No focal deficits.  ? ?Lab Results  ?Component Value Date  ? CREATININE 0.83 10/22/2021  ? BUN 18 10/22/2021  ? NA 144 10/22/2021  ? K 4.5 10/22/2021  ? CL 107 (H) 10/22/2021  ? CO2 22 10/22/2021  ? ?Lab Results  ?Component Value Date  ? ALT 24 10/22/2021  ? AST 14 10/22/2021  ? ALKPHOS 77 10/22/2021  ? BILITOT 0.4 10/22/2021  ? ?Lab Results  ?Component Value Date  ? HGBA1C 5.7 (H) 10/22/2021  ? HGBA1C 5.5 07/07/2021  ? ?Lab Results  ?Component Value Date  ? INSULIN 9.0 10/22/2021  ? INSULIN 9.4 07/07/2021  ? ?Lab Results  ?Component Value Date  ? TSH 2.05 12/25/2020  ? ?Lab Results  ?Component Value Date  ? CHOL 201 (H) 10/22/2021  ? HDL 48 10/22/2021  ? LDLCALC 135 (H) 10/22/2021  ? TRIG 102 10/22/2021  ? CHOLHDL 4.2 10/22/2021  ? ?Lab Results  ?Component Value Date  ? VD25OH 61.5  10/22/2021  ? VD25OH 35.0 07/07/2021  ? VD25OH 38.98 11/30/2019  ? ?Lab Results  ?Component Value Date  ? WBC 6.2 12/25/2020  ? HGB 14.3 12/25/2020  ? HCT 41.9 12/25/2020  ? MCV 88.3 12/25/2020  ? PLT 260.0 12/25/2020  ? ?No results found for: IRON, TIBC, FERRITIN ? ?Attestation Statements:  ? ?Reviewed by clinician on day of visit: allergies, medications, problem list, medical history, surgical history, family history, social history, and previous encounter notes. ? ?I, Jackson Latino, RMA, am  acting as Energy manager for William Hamburger, NP. ? ?I have reviewed the above documentation for accuracy and completeness, and I agree with the above. - Janett Kamath d. Redmond Whittley, NP-C ?

## 2022-01-13 ENCOUNTER — Encounter (INDEPENDENT_AMBULATORY_CARE_PROVIDER_SITE_OTHER): Payer: Self-pay | Admitting: Adult Health

## 2022-01-14 ENCOUNTER — Encounter (INDEPENDENT_AMBULATORY_CARE_PROVIDER_SITE_OTHER): Payer: Self-pay | Admitting: Adult Health

## 2022-01-14 NOTE — Telephone Encounter (Signed)
Please advise 

## 2022-01-18 ENCOUNTER — Ambulatory Visit (INDEPENDENT_AMBULATORY_CARE_PROVIDER_SITE_OTHER): Payer: 59 | Admitting: Adult Health

## 2022-01-18 ENCOUNTER — Encounter (INDEPENDENT_AMBULATORY_CARE_PROVIDER_SITE_OTHER): Payer: Self-pay | Admitting: Adult Health

## 2022-01-18 VITALS — BP 104/73 | HR 83 | Temp 97.9°F | Ht 61.0 in | Wt 152.0 lb

## 2022-01-18 DIAGNOSIS — Z6828 Body mass index (BMI) 28.0-28.9, adult: Secondary | ICD-10-CM | POA: Diagnosis not present

## 2022-01-18 DIAGNOSIS — E669 Obesity, unspecified: Secondary | ICD-10-CM | POA: Diagnosis not present

## 2022-01-18 DIAGNOSIS — E559 Vitamin D deficiency, unspecified: Secondary | ICD-10-CM

## 2022-01-19 NOTE — Progress Notes (Signed)
Chief Complaint:   OBESITY Devanee is here to discuss her progress with her obesity treatment plan along with follow-up of her obesity related diagnoses. Taelyn is on the Category 1 Plan and states she is following her eating plan approximately 90-95% of the time. Rebecah states she is walking 10 minutes 2 times per week.  Today's visit was #: 11 Starting weight: 170 lbs Starting date: 07/07/2021 Today's weight: 152 lbs Today's date: 01/18/2022 Total lbs lost to date: 18 Total lbs lost since last in-office visit: 2  Interim History:  Bioimpedance results reviewed with pt:  Muscle mass + 0.4 lbs.  Adipose mass -3.2 lbs.  June beach trip [place] days . She does not like seafood, however she does enjoy beach walking.  Of Note: Husband at [facility] during office visit.  Subjective:   1. Vitamin D deficiency She is currently taking over the counter Vit D3, 5,000 IU, daily. She denies excessive fatigue.  Assessment/Plan:   1. Vitamin D deficiency Cristie will continue taking over the counter Vit D3, 5, 000 IU, daily.  2. Obesity with current BMI of 28.7 Launa is currently in the action stage of change. As such, her goal is to continue with weight loss efforts. She has agreed to the Category 1 Plan.   Exercise goals: As is.  Behavioral modification strategies: increasing lean protein intake, decreasing simple carbohydrates, meal planning and cooking strategies, keeping healthy foods in the home, and planning for success.  Kerly has agreed to follow-up with our clinic in 3 weeks, fasting. She was informed of the importance of frequent follow-up visits to maximize her success with intensive lifestyle modifications for her multiple health conditions.   Objective:   Blood pressure 104/73, pulse 83, temperature 97.9 F (36.6 C), height 5\' 1"  (1.549 m), weight 152 lb (68.9 kg), SpO2 98 %. Body mass index is 28.72 kg/m.  General: Cooperative,  alert, well developed, in no acute distress. HEENT: Conjunctivae and lids unremarkable. Cardiovascular: Regular rhythm.  Lungs: Normal work of breathing. Neurologic: No focal deficits.   Lab Results  Component Value Date   CREATININE 0.83 10/22/2021   BUN 18 10/22/2021   NA 144 10/22/2021   K 4.5 10/22/2021   CL 107 (H) 10/22/2021   CO2 22 10/22/2021   Lab Results  Component Value Date   ALT 24 10/22/2021   AST 14 10/22/2021   ALKPHOS 77 10/22/2021   BILITOT 0.4 10/22/2021   Lab Results  Component Value Date   HGBA1C 5.7 (H) 10/22/2021   HGBA1C 5.5 07/07/2021   Lab Results  Component Value Date   INSULIN 9.0 10/22/2021   INSULIN 9.4 07/07/2021   Lab Results  Component Value Date   TSH 2.05 12/25/2020   Lab Results  Component Value Date   CHOL 201 (H) 10/22/2021   HDL 48 10/22/2021   LDLCALC 135 (H) 10/22/2021   TRIG 102 10/22/2021   CHOLHDL 4.2 10/22/2021   Lab Results  Component Value Date   VD25OH 61.5 10/22/2021   VD25OH 35.0 07/07/2021   VD25OH 38.98 11/30/2019   Lab Results  Component Value Date   WBC 6.2 12/25/2020   HGB 14.3 12/25/2020   HCT 41.9 12/25/2020   MCV 88.3 12/25/2020   PLT 260.0 12/25/2020   No results found for: IRON, TIBC, FERRITIN  Attestation Statements:   Reviewed by clinician on day of visit: allergies, medications, problem list, medical history, surgical history, family history, social history, and  previous encounter notes.  Time spent on visit including pre-visit chart review and post-visit care and charting was 35 minutes.   I, Brendell Tyus, RMA, am acting as transcriptionist for William Hamburger, NP.  I have reviewed the above documentation for accuracy and completeness, and I agree with the above. - Teshara Moree d. Torrin Crihfield, NP-C

## 2022-01-20 ENCOUNTER — Telehealth: Payer: Self-pay | Admitting: Gastroenterology

## 2022-01-20 NOTE — Telephone Encounter (Signed)
The manufacturer of Colestid changed and the pills are to big. She has only been taking them every other day anyway. I told her she could try to see how she does without taking the medication but I would get with Dr Lavon Paganini to see if it would be ok if she crushes the pill.  She also wanted [place] to know her insurance will not pay for Librax anymore so she is still staying on Viberzi which is working fine for her.  Dr Lavon Paganini can she crush the Colestid?  ?

## 2022-01-20 NOTE — Telephone Encounter (Signed)
Yes it is fine to crush Colestid or we can send her prescription for Prevalite if she prefers powder formulary.  Thanks ?

## 2022-01-20 NOTE — Telephone Encounter (Signed)
Patient called to discuss medications. Per patient, there have been some changes and problems with medications (colestid, librax). Please call patient to advise. ?

## 2022-01-29 NOTE — Telephone Encounter (Signed)
I had already spoke to the patient on the phone that it was ok to crush her Colestid

## 2022-02-02 ENCOUNTER — Other Ambulatory Visit: Payer: Self-pay | Admitting: Physician Assistant

## 2022-02-02 DIAGNOSIS — M25572 Pain in left ankle and joints of left foot: Secondary | ICD-10-CM

## 2022-02-09 ENCOUNTER — Other Ambulatory Visit: Payer: Self-pay | Admitting: Gastroenterology

## 2022-02-09 ENCOUNTER — Encounter (INDEPENDENT_AMBULATORY_CARE_PROVIDER_SITE_OTHER): Payer: Self-pay | Admitting: Adult Health

## 2022-02-09 ENCOUNTER — Ambulatory Visit (INDEPENDENT_AMBULATORY_CARE_PROVIDER_SITE_OTHER): Payer: 59 | Admitting: Adult Health

## 2022-02-09 VITALS — BP 99/63 | HR 84 | Temp 98.2°F | Ht 61.0 in | Wt 152.0 lb

## 2022-02-09 DIAGNOSIS — K5909 Other constipation: Secondary | ICD-10-CM

## 2022-02-09 DIAGNOSIS — E559 Vitamin D deficiency, unspecified: Secondary | ICD-10-CM | POA: Diagnosis not present

## 2022-02-09 DIAGNOSIS — Z6828 Body mass index (BMI) 28.0-28.9, adult: Secondary | ICD-10-CM

## 2022-02-09 DIAGNOSIS — E669 Obesity, unspecified: Secondary | ICD-10-CM | POA: Diagnosis not present

## 2022-02-11 ENCOUNTER — Telehealth: Payer: Self-pay | Admitting: Gastroenterology

## 2022-02-11 MED ORDER — VIBERZI 75 MG PO TABS: 1.0000 | ORAL_TABLET | Freq: Two times a day (BID) | ORAL | 3 refills | Status: DC

## 2022-02-11 NOTE — Telephone Encounter (Signed)
Got a rejection back from pharmacy that Viberzi cant be faxed anymore,  Had to be called in. Spoke with [facility] Brass at [facility] and refilled pts medication that way

## 2022-02-11 NOTE — Telephone Encounter (Signed)
Refilled Viberzi for patient and faxed to pharmacy   [facility] patient and informed her that med was faxed

## 2022-02-11 NOTE — Telephone Encounter (Signed)
Patent called regarding a refill for Viberzi said she will be running out by the weekend. Also requested a call once done so she knows.

## 2022-02-13 DIAGNOSIS — K5909 Other constipation: Secondary | ICD-10-CM | POA: Insufficient documentation

## 2022-02-13 NOTE — Progress Notes (Unsigned)
Chief Complaint:   OBESITY [place] is here to discuss her progress with her obesity treatment plan along with follow-up of her obesity related diagnoses. Rae is on the Category 1 Plan "not sure of the plan name" and states she is following her eating plan approximately 95% of the time. Dallis states she is walking 35 minutes 2 times per week.  Today's visit was #: 12 Starting weight: 170 lbs Starting date: 07/07/2021 Today's weight: 152 lbs Today's date: 02/09/2022 Total lbs lost to date: 18 lbs Total lbs lost since last in-office visit: 0  Interim History: Da since last office visit has decreased eating out frequency.  Of note:  Husband at  *** during office visit.   Subjective:   1. Other constipation Kileah usually has 2-3 bowel movements per day.  Last week she had bowel movements every other day.  She denies abdominal pain or hematochezia.  She has a history of irritable bowel syndrome with diarrhea, her colonoscopy is up to date.   2. Vitamin D deficiency Anavictoria is now on OTC Vitamin D 5,000 IU daily.    Assessment/Plan:   1. Other constipation Increase daily walking, and remain well hydrated.  Use OTC Miralax.   2. Vitamin D deficiency Check labs at next office visit.   3. Obesity with current BMI of 28.7 Jaclynn is currently in the action stage of change. As such, her goal is to continue with weight loss efforts. She has agreed to the Category 1 Plan.   Exercise goals:  As is.   Behavioral modification strategies: increasing lean protein intake, decreasing simple carbohydrates, meal planning and cooking strategies, keeping healthy foods in the home, and planning for success.  Zanie has agreed to follow-up with our clinic in 3 weeks. She was informed of the importance of frequent follow-up visits to maximize her success with intensive lifestyle modifications for her multiple health conditions.   Objective:   Blood pressure 99/63,  pulse 84, temperature 98.2 F (36.8 C), height 5\' 1"  (1.549 m), weight 152 lb (68.9 kg), SpO2 98 %. Body mass index is 28.72 kg/m.  General: Cooperative, alert, well developed, in no acute distress. HEENT: Conjunctivae and lids unremarkable. Cardiovascular: Regular rhythm.  Lungs: Normal work of breathing. Neurologic: No focal deficits.   Lab Results  Component Value Date   CREATININE 0.83 10/22/2021   BUN 18 10/22/2021   NA 144 10/22/2021   K 4.5 10/22/2021   CL 107 (H) 10/22/2021   CO2 22 10/22/2021   Lab Results  Component Value Date   ALT 24 10/22/2021   AST 14 10/22/2021   ALKPHOS 77 10/22/2021   BILITOT 0.4 10/22/2021   Lab Results  Component Value Date   HGBA1C 5.7 (H) 10/22/2021   HGBA1C 5.5 07/07/2021   Lab Results  Component Value Date   INSULIN 9.0 10/22/2021   INSULIN 9.4 07/07/2021   Lab Results  Component Value Date   TSH 2.05 12/25/2020   Lab Results  Component Value Date   CHOL 201 (H) 10/22/2021   HDL 48 10/22/2021   LDLCALC 135 (H) 10/22/2021   TRIG 102 10/22/2021   CHOLHDL 4.2 10/22/2021   Lab Results  Component Value Date   VD25OH 61.5 10/22/2021   VD25OH 35.0 07/07/2021   VD25OH 38.98 11/30/2019   Lab Results  Component Value Date   WBC 6.2 12/25/2020   HGB 14.3 12/25/2020   HCT 41.9 12/25/2020   MCV 88.3 12/25/2020   PLT 260.0 12/25/2020  No results found for: "IRON", "TIBC", "FERRITIN"   Attestation Statements:   Reviewed by clinician on day of visit: allergies, medications, problem list, medical history, surgical history, family history, social history, and previous encounter notes.  I, Davy Pique, RMA, am acting as Location manager for Mina Marble, NP.  I have reviewed the above documentation for accuracy and completeness, and I agree with the above. -  ***

## 2022-02-27 ENCOUNTER — Encounter (INDEPENDENT_AMBULATORY_CARE_PROVIDER_SITE_OTHER): Payer: Self-pay | Admitting: Adult Health

## 2022-03-04 ENCOUNTER — Encounter (INDEPENDENT_AMBULATORY_CARE_PROVIDER_SITE_OTHER): Payer: Self-pay | Admitting: Adult Health

## 2022-03-04 ENCOUNTER — Ambulatory Visit (INDEPENDENT_AMBULATORY_CARE_PROVIDER_SITE_OTHER): Payer: 59 | Admitting: Adult Health

## 2022-03-04 ENCOUNTER — Encounter: Payer: Self-pay | Admitting: Gastroenterology

## 2022-03-04 VITALS — BP 94/64 | HR 79 | Temp 97.9°F | Ht 61.0 in | Wt 148.0 lb

## 2022-03-04 DIAGNOSIS — E559 Vitamin D deficiency, unspecified: Secondary | ICD-10-CM | POA: Diagnosis not present

## 2022-03-04 DIAGNOSIS — R7303 Prediabetes: Secondary | ICD-10-CM

## 2022-03-04 DIAGNOSIS — E669 Obesity, unspecified: Secondary | ICD-10-CM | POA: Diagnosis not present

## 2022-03-04 DIAGNOSIS — E782 Mixed hyperlipidemia: Secondary | ICD-10-CM | POA: Diagnosis not present

## 2022-03-04 DIAGNOSIS — Z9189 Other specified personal risk factors, not elsewhere classified: Secondary | ICD-10-CM

## 2022-03-04 DIAGNOSIS — Z6828 Body mass index (BMI) 28.0-28.9, adult: Secondary | ICD-10-CM

## 2022-03-04 NOTE — Telephone Encounter (Signed)
Please advise 

## 2022-03-05 ENCOUNTER — Encounter (HOSPITAL_BASED_OUTPATIENT_CLINIC_OR_DEPARTMENT_OTHER): Payer: Self-pay | Admitting: Internal Medicine

## 2022-03-05 ENCOUNTER — Encounter (INDEPENDENT_AMBULATORY_CARE_PROVIDER_SITE_OTHER): Payer: Self-pay | Admitting: Adult Health

## 2022-03-05 ENCOUNTER — Ambulatory Visit (INDEPENDENT_AMBULATORY_CARE_PROVIDER_SITE_OTHER): Payer: 59 | Admitting: Internal Medicine

## 2022-03-05 VITALS — BP 90/56 | HR 75 | Ht 61.5 in | Wt 154.9 lb

## 2022-03-05 DIAGNOSIS — T466X5A Adverse effect of antihyperlipidemic and antiarteriosclerotic drugs, initial encounter: Secondary | ICD-10-CM

## 2022-03-05 DIAGNOSIS — E7849 Other hyperlipidemia: Secondary | ICD-10-CM

## 2022-03-05 DIAGNOSIS — I700 Atherosclerosis of aorta: Secondary | ICD-10-CM | POA: Diagnosis not present

## 2022-03-05 DIAGNOSIS — Z8249 Family history of ischemic heart disease and other diseases of the circulatory system: Secondary | ICD-10-CM | POA: Diagnosis not present

## 2022-03-05 DIAGNOSIS — M791 Myalgia, unspecified site: Secondary | ICD-10-CM

## 2022-03-05 LAB — LIPID PANEL
Chol/HDL Ratio: 3.8 ratio (ref 0.0–4.4)
Cholesterol, Total: 211 mg/dL — ABNORMAL HIGH (ref 100–199)
HDL: 55 mg/dL (ref >39)
LDL Chol Calc (NIH): 142 mg/dL — ABNORMAL HIGH (ref 0–99)
Triglycerides: 77 mg/dL (ref 0–149)
VLDL Cholesterol Cal: 14 mg/dL (ref 5–40)

## 2022-03-05 LAB — COMPREHENSIVE METABOLIC PANEL
ALT: 18 IU/L (ref 0–32)
AST: 17 IU/L (ref 0–40)
Albumin/Globulin Ratio: 2 (ref 1.2–2.2)
Albumin: 4.4 g/dL (ref 3.8–4.9)
Alkaline Phosphatase: 74 IU/L (ref 44–121)
BUN/Creatinine Ratio: 24 — ABNORMAL HIGH (ref 9–23)
BUN: 20 mg/dL (ref 6–24)
Bilirubin Total: 0.6 mg/dL (ref 0.0–1.2)
CO2: 24 mmol/L (ref 20–29)
Calcium: 9.7 mg/dL (ref 8.7–10.2)
Chloride: 104 mmol/L (ref 96–106)
Creatinine, Ser: 0.83 mg/dL (ref 0.57–1.00)
Globulin, Total: 2.2 g/dL (ref 1.5–4.5)
Glucose: 84 mg/dL (ref 70–99)
Potassium: 4.5 mmol/L (ref 3.5–5.2)
Sodium: 143 mmol/L (ref 134–144)
Total Protein: 6.6 g/dL (ref 6.0–8.5)
eGFR: 83 mL/min/{1.73_m2} (ref >59)

## 2022-03-05 LAB — INSULIN, RANDOM: INSULIN: 4 u[IU]/mL (ref 2.6–24.9)

## 2022-03-05 LAB — VITAMIN D 25 HYDROXY (VIT D DEFICIENCY, FRACTURES): Vit D, 25-Hydroxy: 73.6 ng/mL (ref 30.0–100.0)

## 2022-03-05 LAB — HEMOGLOBIN A1C
Est. average glucose Bld gHb Est-mCnc: 105 mg/dL
Hgb A1c MFr Bld: 5.3 % (ref 4.8–5.6)

## 2022-03-05 NOTE — Progress Notes (Addendum)
LIPID CLINIC CONSULT NOTE  Chief Complaint:  Manage dyslipidemia  Primary Care Physician: Allwardt, Crist Infante, PA-C  Primary Cardiologist:  None  HPI:  Kim Clements is a 56 y.o. female who is being seen today for the evaluation of dyslipidemia at the request of Allwardt, Alyssa M, PA-C.  This is a pleasant 56 year old female kindly referred for evaluation management of dyslipidemia.  She reports a family history of heart disease including her mother who had an MI at age 24 and both parents who have high cholesterol.  Her father died of a stroke suddenly at age 66.  Her recent cholesterol profile showed total of 211, HDL 55, triglycerides 77 and LDL 142.  Unfortunately, she could not tolerate statins causing significant myalgias.  She did have some abdominal imaging which showed aortic atherosclerosis.  She suffers from IBS and is on Viberzi.  She has no other traditional cardiovascular risk factors other than family history of early onset heart disease.  PMHx:  Past Medical History:  Diagnosis Date   Anal fissure    Anxiety    Arthritis    Asthma    Back pain    Colon polyps    Diverticulosis    Edema, lower extremity    GERD (gastroesophageal reflux disease)    HLD (hyperlipidemia)    IBS (irritable bowel syndrome)    Joint pain    Lactose intolerance    Osteoarthritis    Vitamin D deficiency     Past Surgical History:  Procedure Laterality Date   ABDOMINAL HYSTERECTOMY  2007   LAPAROSCOPY  1988   Laser. Indication: Endometriosis.   TONSILLECTOMY  1971    FAMHx:  Family History  Problem Relation Age of Onset   Colon polyps Mother    Heart disease Mother    Rheum arthritis Mother    Osteoarthritis Mother    High blood pressure Mother    Anxiety disorder Mother    Colon polyps Father    Stroke Father    High blood pressure Father    High Cholesterol Father    Heart disease Father    Heart disease Maternal Grandmother    Heart disease Maternal  Grandfather    Breast cancer Paternal Grandmother        all of paternal females   Prostate cancer Paternal Grandfather    Heart disease Paternal Grandfather    Breast cancer Paternal Aunt    Colon cancer Neg Hx     SOCHx:   reports that she has never smoked. She has never used smokeless tobacco. She reports that she does not drink alcohol and does not use drugs.  ALLERGIES:  Allergies  Allergen Reactions   Erythromycin Rash   Penicillins Hives   Sulfa Antibiotics Rash    ROS: Pertinent items noted in HPI and remainder of comprehensive ROS otherwise negative.  HOME MEDS: Current Outpatient Medications on File Prior to Visit  Medication Sig Dispense Refill   AMBULATORY NON FORMULARY MEDICATION Allergy Shots 2 shots per week     amitriptyline (ELAVIL) 10 MG tablet TAKE 1 TABLET BY MOUTH EVERY DAY AT NIGHT 90 tablet 0   bismuth subsalicylate (PEPTO BISMOL) 262 MG/15ML suspension Take 30 mLs by mouth as needed.     calcium carbonate (TUMS - DOSED IN MG ELEMENTAL CALCIUM) 500 MG chewable tablet Chew 1 tablet by mouth daily.     Cholecalciferol (VITAMIN D3) 25 MCG (1000 UT) CAPS Take 5 capsules by mouth daily. 5000 IU/daily  Efinaconazole (JUBLIA) 10 % SOLN Apply 1 application topically daily.     Eluxadoline (VIBERZI) 75 MG TABS Take 1 tablet by mouth 2 (two) times daily. 60 tablet 3   EPINEPHrine 0.3 mg/0.3 mL IJ SOAJ injection SMARTSIG:IM As Directed PRN     fexofenadine (ALLEGRA) 180 MG tablet Take 180 mg by mouth daily.     Loperamide HCl (IMODIUM A-D PO) Take by mouth as needed.     Simethicone (GAS-X PO) Take by mouth as needed.     colestipol (COLESTID) 1 g tablet Take 1 tablet (1 g total) by mouth 2 (two) times daily. (Patient not taking: Reported on 03/05/2022) 60 tablet 3   No current facility-administered medications on file prior to visit.    LABS/IMAGING: Results for orders placed or performed in visit on 03/04/22 (from the past 48 hour(s))  Comprehensive  metabolic panel     Status: Abnormal   Collection Time: 03/04/22  3:30 PM  Result Value Ref Range   Glucose 84 70 - 99 mg/dL   BUN 20 6 - 24 mg/dL   Creatinine, Ser 4.09 0.57 - 1.00 mg/dL   eGFR 83 >81 XB/JYN/8.29   BUN/Creatinine Ratio 24 (H) 9 - 23   Sodium 143 134 - 144 mmol/L   Potassium 4.5 3.5 - 5.2 mmol/L   Chloride 104 96 - 106 mmol/L   CO2 24 20 - 29 mmol/L   Calcium 9.7 8.7 - 10.2 mg/dL   Total Protein 6.6 6.0 - 8.5 g/dL   Albumin 4.4 3.8 - 4.9 g/dL    Comment:                **Effective March 15, 2022 Albumin reference interval**                  will be changing to:                             Age                  Female          Female                            0 -   7 days       3.6 - 4.9      3.6 - 4.9                            8 -  30 days       3.5 - 4.6      3.5 - 4.6                            1 -   6 months     3.7 - 4.8      3.7 - 4.8                     7 months -   2 years      4.0 - 5.0      4.0 - 5.0                            3 -   5 years      4.1 -  5.0      4.1 - 5.0                            6 -  12 years      4.2 - 5.0      4.2 - 5.0                           13 -  30 years      4.3 - 5.2      4.0 - 5.0                           31 -  50 years      4.1 - 5.1      3.9 - 4.9                           51 -  60 years      3.8 - 4.9      3.8 - 4.9                           61 -  70 years      3.9 - 4.9      3.9 - 4.9                           71 -  80 years      3.8 - 4.8      3.8 - 4.8                           8 1  -  89 years      3.7 - 4.7      3.7 - 4.7                           90 - 199 years      3.6 - 4.6      3.6 - 4.6    Globulin, Total 2.2 1.5 - 4.5 g/dL   Albumin/Globulin Ratio 2.0 1.2 - 2.2   Bilirubin Total 0.6 0.0 - 1.2 mg/dL   Alkaline Phosphatase 74 44 - 121 IU/L   AST 17 0 - 40 IU/L   ALT 18 0 - 32 IU/L  Hemoglobin A1c     Status: None   Collection Time: 03/04/22  3:30 PM  Result Value Ref Range   Hgb A1c MFr Bld 5.3 4.8 - 5.6 %     Comment:          Prediabetes: 5.7 - 6.4          Diabetes: >6.4          Glycemic control for adults with diabetes: <7.0    Est. average glucose Bld gHb Est-mCnc 105 mg/dL  Insulin, random     Status: None   Collection Time: 03/04/22  3:30 PM  Result Value Ref Range   INSULIN 4.0 2.6 - 24.9 uIU/mL  VITAMIN D 25 Hydroxy (Vit-D Deficiency, Fractures)     Status: None   Collection Time: 03/04/22  3:30 PM  Result Value Ref Range   Vit D, 25-Hydroxy 73.6 30.0 - 100.0 ng/mL    Comment: Vitamin D  deficiency has been defined by the Institute of Medicine and an Endocrine Society practice guideline as a level of serum 25-OH vitamin D less than 20 ng/mL (1,2). The Endocrine Society went on to further define vitamin D insufficiency as a level between 21 and 29 ng/mL (2). 1. IOM (Institute of Medicine). 2010. Dietary reference    intakes for calcium and D. [place]: The    [employer]. 2. Holick MF, Binkley Sumter, Bischoff-Ferrari HA, et al.    Evaluation, treatment, and prevention of vitamin D    deficiency: an Endocrine Society clinical practice    guideline. JCEM. 2011 Jul; 96(7):1911-30.   Lipid panel     Status: Abnormal   Collection Time: 03/04/22  3:30 PM  Result Value Ref Range   Cholesterol, Total 211 (H) 100 - 199 mg/dL   Triglycerides 77 0 - 149 mg/dL   HDL 55 >16 mg/dL   VLDL Cholesterol Cal 14 5 - 40 mg/dL   LDL Chol Calc (NIH) 109 (H) 0 - 99 mg/dL   Chol/HDL Ratio 3.8 0.0 - 4.4 ratio    Comment:                                   T. Chol/HDL Ratio                                             Men  Women                               1/2 Avg.Risk  3.4    3.3                                   Avg.Risk  5.0    4.4                                2X Avg.Risk  9.6    7.1                                3X Avg.Risk 23.4   11.0    No results found.  LIPID PANEL:    Component Value Date/Time   CHOL 211 (H) 03/04/2022 1530   TRIG 77 03/04/2022 1530   HDL 55  03/04/2022 1530   CHOLHDL 3.8 03/04/2022 1530   CHOLHDL 4 12/25/2020 1130   VLDL 24.2 12/25/2020 1130   LDLCALC 142 (H) 03/04/2022 1530    WEIGHTS: Wt Readings from Last 3 Encounters:  03/05/22 154 lb 14.4 oz (70.3 kg)  03/04/22 148 lb (67.1 kg)  02/09/22 152 lb (68.9 kg)    VITALS: BP (!) 90/56   Pulse 75   Ht 5' 1.5" (1.562 m)   Wt 154 lb 14.4 oz (70.3 kg)   SpO2 99%   BMI 28.79 kg/m   EXAM: General appearance: alert and no distress Neck: no carotid bruit, no JVD, and thyroid not enlarged, symmetric, no tenderness/mass/nodules Lungs: clear to auscultation bilaterally Heart: regular rate and rhythm, S1, S2 normal, no murmur, click, rub or gallop Abdomen: soft, non-tender;  bowel sounds normal; no masses,  no organomegaly Extremities: extremities normal, atraumatic, no cyanosis or edema Pulses: 2+ and symmetric Skin: Skin color, texture, turgor normal. No rashes or lesions Neurologic: Grossly normal Psych: Pleasant  EKG: N/A  ASSESSMENT: Mixed dyslipidemia Family history of premature coronary artery disease Statin myalgia Aortic atherosclerosis  PLAN: 1.   Mrs. Bainbridge has a mixed dyslipidemia with a family history of early onset heart disease.  She has few other traditional risk factors but has been intolerant to statins and does have some aortic atherosclerosis.  I like to further restratify her with a coronary calcium score but I suspect that she will need to go on therapy to lower her cholesterol.  Options that are nonstatin include PCSK9 inhibitors, ezetimibe or Nexletol.  I am not sure if she will qualify for PCSK9 inhibitors unless she has a very high calcium score.  Ezetimibe may worsen her IBS symptoms.  Nexletol may be better tolerated but will also require prior authorization.  We could give her samples of this.  We will go ahead and get a calcium score and consider therapy based on those findings.  Thanks for the referral.  Chrystie Nose, MD, Milagros Loll   [place]  [facility]  Medical Director of the [facility] Lipid [facility] &  Cardiovascular Risk Reduction Clinic Diplomate of the American Board of Clinical Lipidology Attending Cardiologist  Direct Dial: [phone]  Fax: [phone]  Website:  [email or web address] Nicely Braiden Presutti 03/05/2022, 4:55 PM

## 2022-03-05 NOTE — Patient Instructions (Signed)
Medication Instructions:  NO CHANGES today   Possible Meds: Nexletol  Zetia  *If you need a refill on your cardiac medications before your next appointment, please call your pharmacy*   Lab Work: FASTING lab work to check cholesterol in about 3-4 months   If you have labs (blood work) drawn today and your tests are completely normal, you will receive your results only by: MyChart Message (if you have MyChart) OR A paper copy in the mail If you have any lab test that is abnormal or we need to change your treatment, we will call you to review the results.   Testing/Procedures: Dr. Rennis Golden has ordered a CT coronary calcium score.   Test locations:  [facility]   This is $99 out of pocket.   Coronary CalciumScan A coronary calcium scan is an imaging test used to look for deposits of calcium and other fatty materials (plaques) in the inner lining of the blood vessels of the heart (coronary arteries). These deposits of calcium and plaques can partly clog and narrow the coronary arteries without producing any symptoms or warning signs. This puts a person at risk for a heart attack. This test can detect these deposits before symptoms develop. Tell a health care provider about: Any allergies you have. All medicines you are taking, including vitamins, herbs, eye drops, creams, and over-the-counter medicines. Any problems you or family members have had with anesthetic medicines. Any blood disorders you have. Any surgeries you have had. Any medical conditions you have. Whether you are pregnant or may be pregnant. What are the risks? Generally, this is a safe procedure. However, problems may occur, including: Harm to a pregnant woman and her unborn baby. This test involves the use of radiation. Radiation exposure can be dangerous to a pregnant woman and her unborn baby. If you are pregnant, you generally should not have this procedure done. Slight increase in  the risk of cancer. This is because of the radiation involved in the test. What happens before the procedure? No preparation is needed for this procedure. What happens during the procedure? You will undress and remove any jewelry around your neck or chest. You will put on a hospital gown. Sticky electrodes will be placed on your chest. The electrodes will be connected to an electrocardiogram (ECG) machine to record a tracing of the electrical activity of your heart. A CT scanner will take pictures of your heart. During this time, you will be asked to lie still and hold your breath for 2-3 seconds while a picture of your heart is being taken. The procedure may vary among health care providers and hospitals. What happens after the procedure? You can get dressed. You can return to your normal activities. It is up to you to get the results of your test. Ask your health care provider, or the department that is doing the test, when your results will be ready. Summary A coronary calcium scan is an imaging test used to look for deposits of calcium and other fatty materials (plaques) in the inner lining of the blood vessels of the heart (coronary arteries). Generally, this is a safe procedure. Tell your health care provider if you are pregnant or may be pregnant. No preparation is needed for this procedure. A CT scanner will take pictures of your heart. You can return to your normal activities after the scan is done. This information is not intended to replace advice given to you by your health care provider. Make sure  you discuss any questions you have with your health care provider. Document Released: 02/19/2008 Document Revised: 07/12/2016 Document Reviewed: 07/12/2016 Elsevier Interactive Patient Education  2017 [employer].    Follow-Up: At [facility], you and your health needs are our priority.  As part of our continuing mission to provide you with exceptional heart care, we have created  designated Provider Care Teams.  These Care Teams include your primary Cardiologist (physician) and Advanced Practice Providers (APPs -  Physician Assistants and Nurse Practitioners) who all work together to provide you with the care you need, when you need it.  We recommend signing up for the patient portal called "MyChart".  Sign up information is provided on this After Visit Summary.  MyChart is used to connect with patients for Virtual Visits (Telemedicine).  Patients are able to view lab/test results, encounter notes, upcoming appointments, etc.  Non-urgent messages can be sent to your provider as well.   To learn more about what you can do with MyChart, go to [email or web address].    Your next appointment:   4 month(s)  The format for your next appointment:   In Person  Provider:   Zoila Shutter MD -- lipid clinic

## 2022-03-08 NOTE — Progress Notes (Signed)
Chief Complaint:   OBESITY Kim Clements is here to discuss her progress with her obesity treatment plan along with follow-up of her obesity related diagnoses. Kim Clements is on the Category 1 Plan and states she is following her eating plan approximately 90% of the time. Kim Clements states she is walking 35 minutes 1-2 times per week.  Today's visit was #: 13 Starting weight: 170 lbs Starting date: 07/07/2021 Today's weight: 148 lbs Today's date: 03/04/2022 Total lbs lost to date: 22 lbs Total lbs lost since last in-office visit: 4  Interim History:  Kim Clements had several Bday celebrations, friends over to eat.  She has provided the following food recall that is typical of day: Breakfast- yogurt with toast, one egg and cheese.  Lunch- 6oz meat with 45 calorie bread. Dinner- meat protein with vegetables.  Snacks- mint oreo's and tostida's.  Subjective:   1. Mixed hyperlipidemia Kim Clements has office visit with Dr. Rennis Golden on 03/05/2022 for dyslipidemia. She endorses the following family history of heart disease including her mother who had an MI at age 78 and both parents who have HLD.  2. Vitamin D deficiency Kim Clements is on over the counter Vit D3 5,000 IU every day.  3. Prediabetes Kim Clements is not on any blood glucose lowering prescription at present.  4. At risk for heart disease Kim Clements is at higher than average risk for cardiovascular disease due to obesity and HLD.  Assessment/Plan:   1. Mixed hyperlipidemia We will obtain labs today.  - Comprehensive metabolic panel - Lipid panel  2. Vitamin D deficiency We will obtain labs today.  - VITAMIN D 25 Hydroxy (Vit-D Deficiency, Fractures)  3. Prediabetes We will obtain labs today.  - Hemoglobin A1c - Insulin, random  4. At risk for heart disease Kim Clements was given approximately 15 minutes of coronary artery disease prevention counseling today. She is 56 y.o. female and has risk factors for heart disease  including obesity. We discussed intensive lifestyle modifications today with an emphasis on specific weight loss instructions and strategies.  Repetitive spaced learning was employed today to elicit superior memory formation and behavioral change.   5. Obesity with current BMI of 28.0 Kim Clements is currently in the action stage of change. As such, her goal is to continue with weight loss efforts. She has agreed to the Category 1 Plan.   Exercise goals: As is.  Behavioral modification strategies: increasing lean protein intake, decreasing simple carbohydrates, meal planning and cooking strategies, keeping healthy foods in the home, and planning for success.  Kim Clements has agreed to follow-up with our clinic in 3 weeks. She was informed of the importance of frequent follow-up visits to maximize her success with intensive lifestyle modifications for her multiple health conditions.   Kim Clements was informed we would discuss her lab results at her next visit unless there is a critical issue that needs to be addressed sooner. Kim Clements agreed to keep her next visit at the agreed upon time to discuss these results.  Objective:   Blood pressure 94/64, pulse 79, temperature 97.9 F (36.6 C), height 5\' 1"  (1.549 m), weight 148 lb (67.1 kg), SpO2 98 %. Body mass index is 27.96 kg/m.  General: Cooperative, alert, well developed, in no acute distress. HEENT: Conjunctivae and lids unremarkable. Cardiovascular: Regular rhythm.  Lungs: Normal work of breathing. Neurologic: No focal deficits.   Lab Results  Component Value Date   CREATININE 0.83 03/04/2022   BUN 20 03/04/2022   NA 143 03/04/2022   K 4.5 03/04/2022  CL 104 03/04/2022   CO2 24 03/04/2022   Lab Results  Component Value Date   ALT 18 03/04/2022   AST 17 03/04/2022   ALKPHOS 74 03/04/2022   BILITOT 0.6 03/04/2022   Lab Results  Component Value Date   HGBA1C 5.3 03/04/2022   HGBA1C 5.7 (H) 10/22/2021   HGBA1C 5.5 07/07/2021    Lab Results  Component Value Date   INSULIN 4.0 03/04/2022   INSULIN 9.0 10/22/2021   INSULIN 9.4 07/07/2021   Lab Results  Component Value Date   TSH 2.05 12/25/2020   Lab Results  Component Value Date   CHOL 211 (H) 03/04/2022   HDL 55 03/04/2022   LDLCALC 142 (H) 03/04/2022   TRIG 77 03/04/2022   CHOLHDL 3.8 03/04/2022   Lab Results  Component Value Date   VD25OH 73.6 03/04/2022   VD25OH 61.5 10/22/2021   VD25OH 35.0 07/07/2021   Lab Results  Component Value Date   WBC 6.2 12/25/2020   HGB 14.3 12/25/2020   HCT 41.9 12/25/2020   MCV 88.3 12/25/2020   PLT 260.0 12/25/2020   No results found for: "IRON", "TIBC", "FERRITIN"  Attestation Statements:   Reviewed by clinician on day of visit: allergies, medications, problem list, medical history, surgical history, family history, social history, and previous encounter notes.  I, Brendell Tyus, RMA, am acting as transcriptionist for William Hamburger, NP.  I have reviewed the above documentation for accuracy and completeness, and I agree with the above. -  Ashraf Mesta d. Aynsley Fleet, NP-C

## 2022-03-15 DIAGNOSIS — E782 Mixed hyperlipidemia: Secondary | ICD-10-CM | POA: Insufficient documentation

## 2022-03-15 DIAGNOSIS — R7303 Prediabetes: Secondary | ICD-10-CM | POA: Insufficient documentation

## 2022-03-16 ENCOUNTER — Ambulatory Visit (HOSPITAL_BASED_OUTPATIENT_CLINIC_OR_DEPARTMENT_OTHER)
Admission: RE | Admit: 2022-03-16 | Discharge: 2022-03-16 | Disposition: A | Discharge status: Home / Self Care | Payer: 59 | Source: Ambulatory Visit | Attending: Internal Medicine | Admitting: Internal Medicine

## 2022-03-16 DIAGNOSIS — E7849 Other hyperlipidemia: Secondary | ICD-10-CM

## 2022-03-18 ENCOUNTER — Other Ambulatory Visit: Payer: Self-pay | Admitting: *Deleted

## 2022-03-18 MED ORDER — NEXLETOL 180 MG PO TABS: 1.0000 | ORAL_TABLET | Freq: Every day | ORAL | 3 refills | Status: DC

## 2022-03-19 ENCOUNTER — Encounter (HOSPITAL_BASED_OUTPATIENT_CLINIC_OR_DEPARTMENT_OTHER): Payer: Self-pay | Admitting: Internal Medicine

## 2022-03-23 ENCOUNTER — Encounter (INDEPENDENT_AMBULATORY_CARE_PROVIDER_SITE_OTHER): Payer: Self-pay | Admitting: Adult Health

## 2022-03-23 ENCOUNTER — Ambulatory Visit (INDEPENDENT_AMBULATORY_CARE_PROVIDER_SITE_OTHER): Payer: 59 | Admitting: Adult Health

## 2022-03-23 VITALS — BP 100/69 | HR 78 | Temp 97.6°F | Ht 61.0 in | Wt 148.0 lb

## 2022-03-23 DIAGNOSIS — R7303 Prediabetes: Secondary | ICD-10-CM

## 2022-03-23 DIAGNOSIS — E782 Mixed hyperlipidemia: Secondary | ICD-10-CM | POA: Diagnosis not present

## 2022-03-23 DIAGNOSIS — Z6828 Body mass index (BMI) 28.0-28.9, adult: Secondary | ICD-10-CM

## 2022-03-23 DIAGNOSIS — Z9189 Other specified personal risk factors, not elsewhere classified: Secondary | ICD-10-CM | POA: Insufficient documentation

## 2022-03-23 DIAGNOSIS — E669 Obesity, unspecified: Secondary | ICD-10-CM

## 2022-03-23 DIAGNOSIS — E559 Vitamin D deficiency, unspecified: Secondary | ICD-10-CM

## 2022-03-24 ENCOUNTER — Telehealth: Payer: Self-pay

## 2022-03-24 ENCOUNTER — Other Ambulatory Visit: Payer: Self-pay

## 2022-03-24 MED ORDER — CHOLESTYRAMINE LIGHT 4 G PO PACK
2.0000 g | PACK | Freq: Two times a day (BID) | ORAL | 6 refills | Status: DC
Start: 2022-03-24 — End: 2022-05-13

## 2022-03-24 NOTE — Telephone Encounter (Signed)
Prescription provided.

## 2022-03-24 NOTE — Telephone Encounter (Signed)
Yes it is fine to switch to Prevalite or cholestyramine.  Please advise patient to take half packet twice daily, she can titrate the dose as needed based on her response.  Thank you

## 2022-03-24 NOTE — Telephone Encounter (Signed)
The pharmacy cannot get the Colestid brand she is able to swallow. She is asking for a powder the pharmacist told her about. Is it okay to change her to cholestyramine?

## 2022-03-24 NOTE — Progress Notes (Signed)
Chief Complaint:   OBESITY Kim Clements is here to discuss her progress with her obesity treatment plan along with follow-up of her obesity related diagnoses. Kim Clements is on the Category 1 Plan and states she is following her eating plan approximately 90% of the time. Kim Clements states she is walking for 20-30 minutes 2 times per week.  Today's visit was #: 14 Starting weight: 170 lbs Starting date: 07/07/2021 Today's weight: 148 lbs Today's date: 03/23/2022 Total lbs lost to date: 22 Total lbs lost since last in-office visit: 0  Interim History:  On 03/05/2022,initial office visit with cardiology Dr. Rennis Golden.  Cards ordered a CT cardiac scanning, reviewed results with the patient. Kim Clements was started on Nexletol 180 mg daily on 03/18/2022 per cardiology.  She has yet to start new Rx.  Of note: Husband and adult daughter age 29 is present during her office visit.  Subjective:   1. Mixed hyperlipidemia On 03/04/2022 lipid panel Lipid Panel     Component Value Date/Time   CHOL 211 (H) 03/04/2022 1530   TRIG 77 03/04/2022 1530   HDL 55 03/04/2022 1530   CHOLHDL 3.8 03/04/2022 1530   CHOLHDL 4 12/25/2020 1130   VLDL 24.2 12/25/2020 1130   LDLCALC 142 (H) 03/04/2022 1530   LABVLDL 14 03/04/2022 1530     Kim Clements was started on Nexletol 180 mg daily on 03/18/2022 per cardiology.   Her mother (currently age 87) had MI at 89.  She underwent PCI, however she is unsure if stents were placed.  Father passed away at age 51 from CVA.   I discussed labs with the patient today.  2. Vitamin D deficiency On 03/04/2022, Kim Clements's vitamin D level was 73.6.   She is on OTC vitamin D3 5000 units daily.   I discussed labs with the patient today.  3. Prediabetes On 03/04/2022, CMP GFR 83, blood glucose 84, A1c 5.3, and insulin level 4.0.   Kim Clements is not on any blood glucose lowering medications.   I discussed labs with the patient today.  4. At risk for heart disease Kim Clements is at  higher than average risk for cardiovascular disease due to obesity, HLD, and HTN.  Assessment/Plan:   1. Mixed hyperlipidemia Cera agreed to start Nexletol 180 mg once daily per cardiology.  2. Vitamin D deficiency Kim Clements agreed to decrease OTC vitamin D to 4000 units daily.  3. Prediabetes Kim Clements will continue her category 1 meal plan and regular walking.  4. At risk for heart disease Kim Clements was given approximately 15 minutes of coronary artery disease prevention counseling today. She is 56 y.o. female and has risk factors for heart disease including obesity. We discussed intensive lifestyle modifications today with an emphasis on specific weight loss instructions and strategies.  Repetitive spaced learning was employed today to elicit superior memory formation and behavioral change.   5. Obesity, current BMI 28.1 Kim Clements is currently in the action stage of change. As such, her goal is to continue with weight loss efforts. She has agreed to the Category 1 Plan.   Exercise goals: As is.   Behavioral modification strategies: increasing lean protein intake, decreasing simple carbohydrates, meal planning and cooking strategies, keeping healthy foods in the home, and planning for success.  Kim Clements has agreed to follow-up with our clinic in 4 weeks. She was informed of the importance of frequent follow-up visits to maximize her success with intensive lifestyle modifications for her multiple health conditions.   Objective:   Blood pressure 100/69, pulse 78,  temperature 97.6 F (36.4 C), height 5\' 1"  (1.549 m), weight 148 lb (67.1 kg), SpO2 100 %. Body mass index is 27.96 kg/m.  General: Cooperative, alert, well developed, in no acute distress. HEENT: Conjunctivae and lids unremarkable. Cardiovascular: Regular rhythm.  Lungs: Normal work of breathing. Neurologic: No focal deficits.   Lab Results  Component Value Date   CREATININE 0.83 03/04/2022   BUN 20  03/04/2022   NA 143 03/04/2022   K 4.5 03/04/2022   CL 104 03/04/2022   CO2 24 03/04/2022   Lab Results  Component Value Date   ALT 18 03/04/2022   AST 17 03/04/2022   ALKPHOS 74 03/04/2022   BILITOT 0.6 03/04/2022   Lab Results  Component Value Date   HGBA1C 5.3 03/04/2022   HGBA1C 5.7 (H) 10/22/2021   HGBA1C 5.5 07/07/2021   Lab Results  Component Value Date   INSULIN 4.0 03/04/2022   INSULIN 9.0 10/22/2021   INSULIN 9.4 07/07/2021   Lab Results  Component Value Date   TSH 2.05 12/25/2020   Lab Results  Component Value Date   CHOL 211 (H) 03/04/2022   HDL 55 03/04/2022   LDLCALC 142 (H) 03/04/2022   TRIG 77 03/04/2022   CHOLHDL 3.8 03/04/2022   Lab Results  Component Value Date   VD25OH 73.6 03/04/2022   VD25OH 61.5 10/22/2021   VD25OH 35.0 07/07/2021   Lab Results  Component Value Date   WBC 6.2 12/25/2020   HGB 14.3 12/25/2020   HCT 41.9 12/25/2020   MCV 88.3 12/25/2020   PLT 260.0 12/25/2020   No results found for: "IRON", "TIBC", "FERRITIN"  Attestation Statements:   Reviewed by clinician on day of visit: allergies, medications, problem list, medical history, surgical history, family history, social history, and previous encounter notes.   12/27/2020, am acting as transcriptionist for Trude Mcburney, NP.  I have reviewed the above documentation for accuracy and completeness, and I agree with the above. -  Carless Slatten d. Breaker Springer, NP-C

## 2022-04-07 ENCOUNTER — Telehealth: Payer: Self-pay | Admitting: Physician Assistant

## 2022-04-07 NOTE — Telephone Encounter (Signed)
Patient requests to be called at ph# [phone] to be advised on protocol for exposure to Covid 19 (Daughter has tested positive for Covid 19). Patient states she has a couple of questions related to situation-Daughter is also a Patient.

## 2022-04-07 NOTE — Telephone Encounter (Signed)
Called pt and she states daughter tested positive on 04/06/22, her and husband is not having any symptoms at this time. Advised daughter needs to quarantine and needs to wear mask when around her. Let us know if she starts to have symptoms. Wash hands and disinfect well. Pt verbalized understanding. Pt mother is immune compromised how long should they wait before going around her and other people in general. Current incubation time etc?

## 2022-04-09 NOTE — Telephone Encounter (Signed)
Called pt and advised, pt verbalized understanding.  

## 2022-04-14 ENCOUNTER — Encounter (INDEPENDENT_AMBULATORY_CARE_PROVIDER_SITE_OTHER): Payer: Self-pay

## 2022-04-15 ENCOUNTER — Encounter (INDEPENDENT_AMBULATORY_CARE_PROVIDER_SITE_OTHER): Payer: Self-pay | Admitting: Adult Health

## 2022-04-15 ENCOUNTER — Ambulatory Visit (INDEPENDENT_AMBULATORY_CARE_PROVIDER_SITE_OTHER): Payer: 59 | Admitting: Adult Health

## 2022-04-15 VITALS — BP 114/75 | HR 88 | Temp 97.9°F | Ht 61.0 in | Wt 148.0 lb

## 2022-04-15 DIAGNOSIS — K580 Irritable bowel syndrome with diarrhea: Secondary | ICD-10-CM

## 2022-04-15 DIAGNOSIS — Z6828 Body mass index (BMI) 28.0-28.9, adult: Secondary | ICD-10-CM | POA: Diagnosis not present

## 2022-04-15 DIAGNOSIS — E669 Obesity, unspecified: Secondary | ICD-10-CM | POA: Diagnosis not present

## 2022-04-15 DIAGNOSIS — G4700 Insomnia, unspecified: Secondary | ICD-10-CM | POA: Diagnosis not present

## 2022-04-22 NOTE — Progress Notes (Addendum)
Chief Complaint:   OBESITY Kim Clements is here to discuss her progress with her obesity treatment plan along with follow-up of her obesity related diagnoses. Kim Clements is on the Category 1 Plan and states she is following her eating plan approximately 90% of the time. Kim Clements states she is doing 0 minutes 0 times per week.  Today's visit was #: 15 Starting weight: 170 lbs Starting date: 07/07/2021 Today's weight: 148 lbs Today's date: 04/15/2022 Total lbs lost to date: 22 Total lbs lost since last in-office visit: 0  Interim History:  Kim Clements has been unable to walk outside the last 2 weeks due to inclement weather (heat).   They have a treadmill and recumbent bike at home.  Of Note-her husband is at [facility] during OV.  Subjective:   1. Insomnia, unspecified type Kim Clements estimates to sleep 6 to 7 hours per night with frequent awakenings throughout the night (1-4 times per night).   PCP manages amitriptyline 10 mg QD.  2. Irritable bowel syndrome with diarrhea On 12/01/2021, Kim Clements had a GI office visit notes: HPI:  56 year old very pleasant female with history of irritable bowel syndrome predominant diarrhea here for follow-up visit She is taking Viberzi 75 mg twice daily and also Colestid 1 g daily.  She is having overall improvement of symptoms with occasional diarrhea once every few months.  She is avoiding lactose.  Denies abdominal pain, nausea, vomiting, dysphagia, loss of appetite or weight loss. Overall she feels well and has no specific GI complaints.  She would like to use Librax given it is available again, she wants to stop using Viberzi   Colonoscopy February 12, 2016 showed sigmoid diverticulosis internal hemorrhoids otherwise normal exam   Colonoscopy Jan 09, 2010 by Dr. Ewing Schlein at [facility] sigmoid diverticulosis otherwise normal exam   Colonoscopy October 20 2004 by Dr. Maryj[facility].  Terminal ileum biopsies normal mucosa.  2 small polypoid appearing polyps were  removed with biopsy forceps showed benign colonic mucosa.  Sigmoid diverticulosis   Assessment and Plan/Recommendations:   56 year old very pleasant female with irritable bowel syndrome predominant diarrhea here for follow-up visit   IBS-D:  Patient is interested to switching to Librax, will send prescription for Librax 1 capsule up to 3 times daily as needed.  Advised patient to avoid using it continuously and only use it as needed Stop Viberzi to 75 mg twice daily. Continue Colestid 1 g daily at bedtime for bile induced diarrhea Continue lactose-free diet    Due for colonoscopy for surveillance in 2027   Return in 6 months or sooner if needed  Assessment/Plan:   1. Insomnia, unspecified type Kim Clements will continue to follow-up with her PCP.  2. Irritable bowel syndrome with diarrhea Kim Clements will continue her medication therapy per GI.  3. Obesity, current BMI 28.1 Kim Clements is currently in the action stage of change. As such, her goal is to continue with weight loss efforts. She has agreed to Category 1 Meal Plan.   Exercise goals: Cardio (jeopardy) 2-3 times per week.  Behavioral modification strategies: increasing lean protein intake, decreasing simple carbohydrates, meal planning and cooking strategies, keeping healthy foods in the home, and planning for success.  Kim Clements has agreed to follow-up with our clinic in 4 weeks. She was informed of the importance of frequent follow-up visits to maximize her success with intensive lifestyle modifications for her multiple health conditions.   Objective:   Blood pressure 114/75, pulse 88, temperature 97.9 F (36.6 C), height 5\' 1"  (  1.549 m), weight 148 lb (67.1 kg), SpO2 98 %. Body mass index is 27.96 kg/m.  General: Cooperative, alert, well developed, in no acute distress. HEENT: Conjunctivae and lids unremarkable. Cardiovascular: Regular rhythm.  Lungs: Normal work of breathing. Neurologic: No focal deficits.   Lab  Results  Component Value Date   CREATININE 0.83 03/04/2022   BUN 20 03/04/2022   NA 143 03/04/2022   K 4.5 03/04/2022   CL 104 03/04/2022   CO2 24 03/04/2022   Lab Results  Component Value Date   ALT 18 03/04/2022   AST 17 03/04/2022   ALKPHOS 74 03/04/2022   BILITOT 0.6 03/04/2022   Lab Results  Component Value Date   HGBA1C 5.3 03/04/2022   HGBA1C 5.7 (H) 10/22/2021   HGBA1C 5.5 07/07/2021   Lab Results  Component Value Date   INSULIN 4.0 03/04/2022   INSULIN 9.0 10/22/2021   INSULIN 9.4 07/07/2021   Lab Results  Component Value Date   TSH 2.05 12/25/2020   Lab Results  Component Value Date   CHOL 211 (H) 03/04/2022   HDL 55 03/04/2022   LDLCALC 142 (H) 03/04/2022   TRIG 77 03/04/2022   CHOLHDL 3.8 03/04/2022   Lab Results  Component Value Date   VD25OH 73.6 03/04/2022   VD25OH 61.5 10/22/2021   VD25OH 35.0 07/07/2021   Lab Results  Component Value Date   WBC 6.2 12/25/2020   HGB 14.3 12/25/2020   HCT 41.9 12/25/2020   MCV 88.3 12/25/2020   PLT 260.0 12/25/2020   No results found for: "IRON", "TIBC", "FERRITIN"  Attestation Statements:   Reviewed by clinician on day of visit: allergies, medications, problem list, medical history, surgical history, family history, social history, and previous encounter notes.  Time spent on visit including pre-visit chart review and post-visit care and charting was 32 minutes.   Kim Clements, am acting as transcriptionist for William Hamburger, NP.  I have reviewed the above documentation for accuracy and completeness, and I agree with the above. -  Kim Clements d. Kim Bayley, NP-C

## 2022-05-13 ENCOUNTER — Ambulatory Visit (INDEPENDENT_AMBULATORY_CARE_PROVIDER_SITE_OTHER): Payer: 59 | Admitting: Adult Health

## 2022-05-13 ENCOUNTER — Encounter (INDEPENDENT_AMBULATORY_CARE_PROVIDER_SITE_OTHER): Payer: Self-pay | Admitting: Adult Health

## 2022-05-13 VITALS — BP 108/72 | HR 76 | Temp 98.0°F | Ht 61.0 in | Wt 149.0 lb

## 2022-05-13 DIAGNOSIS — E782 Mixed hyperlipidemia: Secondary | ICD-10-CM | POA: Diagnosis not present

## 2022-05-13 DIAGNOSIS — E669 Obesity, unspecified: Secondary | ICD-10-CM

## 2022-05-13 DIAGNOSIS — Z6828 Body mass index (BMI) 28.0-28.9, adult: Secondary | ICD-10-CM | POA: Diagnosis not present

## 2022-05-15 NOTE — Progress Notes (Signed)
Chief Complaint:   OBESITY Kim Clements is here to discuss her progress with her obesity treatment plan along with follow-up of her obesity related diagnoses. Kim Clements is on the Category 1 Plan and states she is following her eating plan approximately 90% of the time. Kim Clements states she is walking 20-30 minutes 3 times per week.  Today's visit was #: 16 Starting weight: 170 lbs Starting date: 07/07/2021 Today's weight: 149 lbs Today's date: 05/13/2022 Total lbs lost to date: 21 lbs Total lbs lost since last in-office visit: +1 lb  Interim History:  She endorses increased snacking. Snacking items:  thin mint Oreo, double stuffed Oreo's, Tostito's with cheese drizzled with taco sauce.   Snacking usually later in the afternoon.   Subjective:   1. Mixed hyperlipidemia The 10-year ASCVD risk score (Arnett DK, et al., 2019) is: 1.7%   Values used to calculate the score:     Age: 56 years     Sex: Female     Is Non-Hispanic African American: No     Diabetic: No     Tobacco smoker: No     Systolic Blood Pressure: 108 mmHg     Is BP treated: No     HDL Cholesterol: 55 mg/dL     Total Cholesterol: 211 mg/dL She started Nexletol 180mg  QD per Cards/Dr. .   Assessment/Plan:   1. Mixed hyperlipidemia Check labs at next visit.   2. Obesity, current BMI 28.2 Check fasting labs at next office visit,  to include labs ordered by Dr Rennis Golden, in system already.   Kim Clements is currently in the action stage of change. As such, her goal is to continue with weight loss efforts. She has agreed to the Category 1 Plan.   Exercise goals:  walking and band it exercises.   Behavioral modification strategies: increasing lean protein intake, decreasing simple carbohydrates, meal planning and cooking strategies, keeping healthy foods in the home, and planning for success.  Kim Clements has agreed to follow-up with our clinic in 4 weeks. She was informed of the importance of frequent follow-up  visits to maximize her success with intensive lifestyle modifications for her multiple health conditions.   Objective:   Blood pressure 108/72, pulse 76, temperature 98 F (36.7 C), height 5\' 1"  (1.549 m), weight 149 lb (67.6 kg), SpO2 96 %. Body mass index is 28.15 kg/m.  General: Cooperative, alert, well developed, in no acute distress. HEENT: Conjunctivae and lids unremarkable. Cardiovascular: Regular rhythm.  Lungs: Normal work of breathing. Neurologic: No focal deficits.   Lab Results  Component Value Date   CREATININE 0.83 03/04/2022   BUN 20 03/04/2022   NA 143 03/04/2022   K 4.5 03/04/2022   CL 104 03/04/2022   CO2 24 03/04/2022   Lab Results  Component Value Date   ALT 18 03/04/2022   AST 17 03/04/2022   ALKPHOS 74 03/04/2022   BILITOT 0.6 03/04/2022   Lab Results  Component Value Date   HGBA1C 5.3 03/04/2022   HGBA1C 5.7 (H) 10/22/2021   HGBA1C 5.5 07/07/2021   Lab Results  Component Value Date   INSULIN 4.0 03/04/2022   INSULIN 9.0 10/22/2021   INSULIN 9.4 07/07/2021   Lab Results  Component Value Date   TSH 2.05 12/25/2020   Lab Results  Component Value Date   CHOL 211 (H) 03/04/2022   HDL 55 03/04/2022   LDLCALC 142 (H) 03/04/2022   TRIG 77 03/04/2022   CHOLHDL 3.8 03/04/2022   Lab Results  Component Value Date   VD25OH 73.6 03/04/2022   VD25OH 61.5 10/22/2021   VD25OH 35.0 07/07/2021   Lab Results  Component Value Date   WBC 6.2 12/25/2020   HGB 14.3 12/25/2020   HCT 41.9 12/25/2020   MCV 88.3 12/25/2020   PLT 260.0 12/25/2020   No results found for: "IRON", "TIBC", "FERRITIN"  Attestation Statements:   Reviewed by clinician on day of visit: allergies, medications, problem list, medical history, surgical history, family history, social history, and previous encounter notes.  I, Malcolm Metro, RMA, am acting as Energy manager for William Hamburger, NP.  I have reviewed the above documentation for accuracy and completeness, and I  agree with the above. -  Lanique Gonzalo d. Mirriam Vadala, NP-C

## 2022-05-18 ENCOUNTER — Encounter (INDEPENDENT_AMBULATORY_CARE_PROVIDER_SITE_OTHER): Payer: Self-pay | Admitting: Adult Health

## 2022-05-18 ENCOUNTER — Encounter: Payer: Self-pay | Admitting: Physician Assistant

## 2022-05-18 ENCOUNTER — Encounter: Payer: Self-pay | Admitting: Gastroenterology

## 2022-05-18 NOTE — Telephone Encounter (Signed)
Please see note and advise  

## 2022-05-19 ENCOUNTER — Encounter (INDEPENDENT_AMBULATORY_CARE_PROVIDER_SITE_OTHER): Payer: Self-pay | Admitting: Adult Health

## 2022-05-31 ENCOUNTER — Encounter: Payer: Self-pay | Admitting: *Deleted

## 2022-06-17 ENCOUNTER — Ambulatory Visit (INDEPENDENT_AMBULATORY_CARE_PROVIDER_SITE_OTHER): Payer: 59 | Admitting: Adult Health

## 2022-06-17 ENCOUNTER — Encounter (INDEPENDENT_AMBULATORY_CARE_PROVIDER_SITE_OTHER): Payer: Self-pay | Admitting: Adult Health

## 2022-06-17 VITALS — BP 128/85 | HR 78 | Temp 97.4°F | Ht 61.0 in | Wt 145.0 lb

## 2022-06-17 DIAGNOSIS — E782 Mixed hyperlipidemia: Secondary | ICD-10-CM

## 2022-06-17 DIAGNOSIS — E559 Vitamin D deficiency, unspecified: Secondary | ICD-10-CM

## 2022-06-17 DIAGNOSIS — K5909 Other constipation: Secondary | ICD-10-CM | POA: Diagnosis not present

## 2022-06-17 DIAGNOSIS — R7303 Prediabetes: Secondary | ICD-10-CM | POA: Diagnosis not present

## 2022-06-17 DIAGNOSIS — Z6827 Body mass index (BMI) 27.0-27.9, adult: Secondary | ICD-10-CM

## 2022-06-17 DIAGNOSIS — E669 Obesity, unspecified: Secondary | ICD-10-CM

## 2022-06-18 LAB — LIPID PANEL
Chol/HDL Ratio: 2.6 ratio (ref 0.0–4.4)
Cholesterol, Total: 150 mg/dL (ref 100–199)
HDL: 57 mg/dL (ref >39)
LDL Chol Calc (NIH): 81 mg/dL (ref 0–99)
Triglycerides: 59 mg/dL (ref 0–149)
VLDL Cholesterol Cal: 12 mg/dL (ref 5–40)

## 2022-06-18 LAB — COMPREHENSIVE METABOLIC PANEL
ALT: 38 IU/L — ABNORMAL HIGH (ref 0–32)
AST: 31 IU/L (ref 0–40)
Albumin/Globulin Ratio: 2.2 (ref 1.2–2.2)
Albumin: 4.7 g/dL (ref 3.8–4.9)
Alkaline Phosphatase: 51 IU/L (ref 44–121)
BUN/Creatinine Ratio: 24 — ABNORMAL HIGH (ref 9–23)
BUN: 20 mg/dL (ref 6–24)
Bilirubin Total: 0.5 mg/dL (ref 0.0–1.2)
CO2: 23 mmol/L (ref 20–29)
Calcium: 9.5 mg/dL (ref 8.7–10.2)
Chloride: 103 mmol/L (ref 96–106)
Creatinine, Ser: 0.82 mg/dL (ref 0.57–1.00)
Globulin, Total: 2.1 g/dL (ref 1.5–4.5)
Glucose: 88 mg/dL (ref 70–99)
Potassium: 4.5 mmol/L (ref 3.5–5.2)
Sodium: 141 mmol/L (ref 134–144)
Total Protein: 6.8 g/dL (ref 6.0–8.5)
eGFR: 84 mL/min/{1.73_m2} (ref >59)

## 2022-06-18 LAB — NMR, LIPOPROFILE
Cholesterol, Total: 156 mg/dL (ref 100–199)
HDL Particle Number: 38 umol/L (ref >=30.5)
HDL-C: 60 mg/dL (ref >39)
LDL Particle Number: 952 nmol/L (ref <1000)
LDL Size: 20.3 nm — ABNORMAL LOW (ref >20.5)
LDL-C (NIH Calc): 85 mg/dL (ref 0–99)
LP-IR Score: 30 (ref <=45)
Small LDL Particle Number: 419 nmol/L (ref <=527)
Triglycerides: 54 mg/dL (ref 0–149)

## 2022-06-18 LAB — VITAMIN D 25 HYDROXY (VIT D DEFICIENCY, FRACTURES): Vit D, 25-Hydroxy: 54.1 ng/mL (ref 30.0–100.0)

## 2022-06-18 LAB — HEMOGLOBIN A1C
Est. average glucose Bld gHb Est-mCnc: 117 mg/dL
Hgb A1c MFr Bld: 5.7 % — ABNORMAL HIGH (ref 4.8–5.6)

## 2022-06-18 LAB — LIPOPROTEIN A (LPA): Lipoprotein (a): 85.8 nmol/L — ABNORMAL HIGH (ref <75.0)

## 2022-06-18 LAB — INSULIN, RANDOM: INSULIN: 3.1 u[IU]/mL (ref 2.6–24.9)

## 2022-06-21 ENCOUNTER — Other Ambulatory Visit: Payer: Self-pay | Admitting: Physician Assistant

## 2022-06-21 DIAGNOSIS — G8929 Other chronic pain: Secondary | ICD-10-CM

## 2022-06-22 ENCOUNTER — Ambulatory Visit: Payer: 59 | Admitting: Physician Assistant

## 2022-06-22 ENCOUNTER — Encounter: Payer: Self-pay | Admitting: Family Medicine

## 2022-06-22 ENCOUNTER — Ambulatory Visit: Payer: 59 | Admitting: Family Medicine

## 2022-06-22 ENCOUNTER — Ambulatory Visit (HOSPITAL_COMMUNITY)
Admission: RE | Admit: 2022-06-22 | Discharge: 2022-06-22 | Disposition: A | Discharge status: Home / Self Care | Payer: 59 | Source: Ambulatory Visit | Attending: Family Medicine | Admitting: Family Medicine

## 2022-06-22 ENCOUNTER — Telehealth: Payer: Self-pay | Admitting: Physician Assistant

## 2022-06-22 VITALS — BP 120/72 | HR 88 | Temp 98.0°F | Ht 61.0 in | Wt 150.4 lb

## 2022-06-22 DIAGNOSIS — M79605 Pain in left leg: Secondary | ICD-10-CM

## 2022-06-22 NOTE — Progress Notes (Signed)
Please call patient: I have reviewed his/her lab results. Please let her know that her Doppler was negative for any clots or cysts. It was normal.  Recommend nsaids and stretching and f/u if not improving. thanks

## 2022-06-22 NOTE — Progress Notes (Signed)
Subjective  CC:  Chief Complaint  Patient presents with   Leg Pain    Pt stated that she has been experiencing some Lt leg pain for the past week and has gotten worse over the last couple of days.    HPI: Claudell Clements is a 56 y.o. female who presents to the office today to address the problems listed above in the chief complaint. 56 yo healthy female presents with about a week of posterior left thigh pain. Reports vague discomfort most noticeable at night when lying in bed with leg fully extended, or if prolonged sitting or if sitting on toilet. No pain with walking. Pain is only intermittent and more of a soreness. She can't bring on the pain and the leg is not swollen, red, tender. She has not limped. She has been on a walking program over the last 6 mnths. No injury. No knee pain. No h/o DVT or risk factors but that is her concern. No FH of VTE. No recent travel. No sob.    Assessment  1. Leg pain, posterior, left      Plan  Leg soreness:  vague. Differential includes DVT, baker's cyst, referred pain from hip, back or knee, hamstring tightness/strain or other. Recommend Doppler to r/o DVT and if negative nsaids and stretching. F/u if worsening or not improving.   Follow up: prn  Visit date not found  Orders Placed This Encounter  Procedures   VAS [place] LOWER EXTREMITY VENOUS (DVT)   No orders of the defined types were placed in this encounter.     I reviewed the patients updated PMH, FH, and SocHx.    Patient Active Problem List   Diagnosis Date Noted   Insomnia 04/15/2022   At risk for heart disease 03/23/2022   Mixed hyperlipidemia 03/15/2022   Prediabetes 03/15/2022   Other constipation 02/13/2022   Vitamin D deficiency 10/22/2021   Insulin resistance 09/28/2021   Aortic atherosclerosis (HCC) 05/09/2020   At high risk for breast cancer 07/02/2019   Allergic rhinitis due to pollen 12/27/2018   Allergic rhinitis due to animal (cat) (dog) hair and dander  12/27/2018   Chronic pain of left ankle 07/03/2017   GAD (generalized anxiety disorder) 12/10/2016   Class 1 obesity with serious comorbidity and body mass index (BMI) of 32.0 to 32.9 in adult 12/10/2016   Depression, recurrent (HCC) 12/10/2016   IBS (irritable bowel syndrome), followed by [facility]    Other hyperlipidemia    GERD (gastroesophageal reflux disease)    Asthma    Arthritis    Current Meds  Medication Sig   AMBULATORY NON FORMULARY MEDICATION Allergy Shots 2 shots per week   amitriptyline (ELAVIL) 10 MG tablet TAKE 1 TABLET BY MOUTH EVERY DAY AT NIGHT   Bempedoic Acid (NEXLETOL) 180 MG TABS Take 1 tablet by mouth daily.   bismuth subsalicylate (PEPTO BISMOL) 262 MG/15ML suspension Take 30 mLs by mouth as needed.   calcium carbonate (TUMS - DOSED IN MG ELEMENTAL CALCIUM) 500 MG chewable tablet Chew 1 tablet by mouth daily.   Cholecalciferol (VITAMIN D3) 25 MCG (1000 UT) CAPS Take 5 capsules by mouth daily. 5000 IU/daily   Efinaconazole (JUBLIA) 10 % SOLN Apply 1 application topically daily.   Eluxadoline (VIBERZI) 75 MG TABS Take 1 tablet by mouth 2 (two) times daily.   EPINEPHrine 0.3 mg/0.3 mL IJ SOAJ injection SMARTSIG:IM As Directed PRN   fexofenadine (ALLEGRA) 180 MG tablet Take 180 mg by mouth daily.  Loperamide HCl (IMODIUM A-D PO) Take by mouth as needed.   Simethicone (GAS-X PO) Take by mouth as needed.    Allergies: Patient is allergic to erythromycin, penicillins, and sulfa antibiotics. Family History: Patient family history includes Anxiety disorder in her mother; Breast cancer in her paternal aunt and paternal grandmother; Colon polyps in her father and mother; Heart disease in her father, maternal grandfather, maternal grandmother, mother, and paternal grandfather; High Cholesterol in her father; High blood pressure in her father and mother; Osteoarthritis in her mother; Prostate cancer in her paternal grandfather; Rheum arthritis in her mother; Stroke in  her father. Social History:  Patient  reports that she has never smoked. She has never used smokeless tobacco. She reports that she does not drink alcohol and does not use drugs.  Review of Systems: Constitutional: Negative for fever malaise or anorexia Cardiovascular: negative for chest pain Respiratory: negative for SOB or persistent cough Gastrointestinal: negative for abdominal pain  Objective  Vitals: BP 120/72   Pulse 88   Temp 98 F (36.7 C)   Ht 5\' 1"  (1.549 m)   Wt 150 lb 6.4 oz (68.2 kg)   SpO2 98%   BMI 28.42 kg/m  General: no acute distress , A&Ox3 Back:FROM Gait: normal Left knee: no crepitus or posterior swelling or ttp.  Left leg: no hamstring ttp, FROM, 5/5 strength w/o pain Neg homan's    Commons side effects, risks, benefits, and alternatives for medications and treatment plan prescribed today were discussed, and the patient expressed understanding of the given instructions. Patient is instructed to call or message via MyChart if he/she has any questions or concerns regarding our treatment plan. No barriers to understanding were identified. We discussed Red Flag symptoms and signs in detail. Patient expressed understanding regarding what to do in case of urgent or emergency type symptoms.  Medication list was reconciled, printed and provided to the patient in AVS. Patient instructions and summary information was reviewed with the patient as documented in the AVS. This note was prepared with assistance of Dragon voice recognition software. Occasional wrong-word or sound-a-like substitutions may have occurred due to the inherent limitations of voice recognition software  This visit occurred during the SARS-CoV-2 public health emergency.  Safety protocols were in place, including screening questions prior to the visit, additional usage of staff PPE, and extensive cleaning of exam room while observing appropriate contact time as indicated for disinfecting solutions.

## 2022-06-22 NOTE — Telephone Encounter (Signed)
error 

## 2022-06-22 NOTE — Patient Instructions (Signed)
Please follow up if symptoms do not improve or as needed.    We will get a venous doppler ultrasound to ensure no blood clot in the veins.  If negative, then nsaids and stretches should help. Let us know if it doesn't.

## 2022-06-25 ENCOUNTER — Telehealth: Payer: Self-pay | Admitting: Internal Medicine

## 2022-06-25 ENCOUNTER — Ambulatory Visit (INDEPENDENT_AMBULATORY_CARE_PROVIDER_SITE_OTHER): Payer: 59 | Admitting: Internal Medicine

## 2022-06-25 ENCOUNTER — Encounter (HOSPITAL_BASED_OUTPATIENT_CLINIC_OR_DEPARTMENT_OTHER): Payer: Self-pay | Admitting: Internal Medicine

## 2022-06-25 VITALS — BP 128/80 | HR 67 | Ht 61.0 in | Wt 150.8 lb

## 2022-06-25 DIAGNOSIS — M791 Myalgia, unspecified site: Secondary | ICD-10-CM

## 2022-06-25 DIAGNOSIS — Z8249 Family history of ischemic heart disease and other diseases of the circulatory system: Secondary | ICD-10-CM | POA: Diagnosis not present

## 2022-06-25 DIAGNOSIS — T466X5A Adverse effect of antihyperlipidemic and antiarteriosclerotic drugs, initial encounter: Secondary | ICD-10-CM

## 2022-06-25 DIAGNOSIS — E785 Hyperlipidemia, unspecified: Secondary | ICD-10-CM

## 2022-06-25 DIAGNOSIS — I700 Atherosclerosis of aorta: Secondary | ICD-10-CM

## 2022-06-25 DIAGNOSIS — R931 Abnormal findings on diagnostic imaging of heart and coronary circulation: Secondary | ICD-10-CM

## 2022-06-25 DIAGNOSIS — E7841 Elevated Lipoprotein(a): Secondary | ICD-10-CM

## 2022-06-25 NOTE — Patient Instructions (Signed)
Medication Instructions:  We will check on getting authorization for an injectable cholesterol medication -- Repatha OR Praluent This is a self-administered medication, given once every 14 days.  Administer medication in area of fatty tissue such as abdomen, outer thigh, back of upper arm - and rotate site with each injection Store medication in refrigerator until ready to administer - allow to sit at room temp for 30 mins - 1 hour prior to injection Dispose of medication in a SHARPS container - your pharmacy should be able to direct you on this and proper disposal   If you need a co-pay card for Repatha: [email or web address] If you need a co-pay card for Praluent: [email or web address]     *If you need a refill on your cardiac medications before your next appointment, please call your pharmacy*   Lab Work:  FASTING lab work to check cholesterol in about 3-4 months -- complete about 1 week before your next visit  If you have labs (blood work) drawn today and your tests are completely normal, you will receive your results only by: [place] (if you have MyChart) OR A paper copy in the mail If you have any lab test that is abnormal or we need to change your treatment, we will call you to review the results.   Follow-Up: At [facility], you and your health needs are our priority.  As part of our continuing mission to provide you with exceptional heart care, we have created designated Provider Care Teams.  These Care Teams include your primary Cardiologist (physician) and Advanced Practice Providers (APPs -  Physician Assistants and Nurse Practitioners) who all work together to provide you with the care you need, when you need it.  We recommend signing up for the patient portal called "MyChart".  Sign up information is provided on this After Visit Summary.  MyChart is used to connect with patients for Virtual Visits (Telemedicine).   Patients are able to view lab/test results, encounter notes, upcoming appointments, etc.  Non-urgent messages can be sent to your provider as well.   To learn more about what you can do with MyChart, go to [email or web address].    Your next appointment:    3-4 months with Dr. Debara Pickett

## 2022-06-25 NOTE — Telephone Encounter (Signed)
Patient seen by Dr. Debara Pickett 06/25/22 He would like to see if patient can get approval for [facility]

## 2022-06-25 NOTE — Progress Notes (Signed)
LIPID CLINIC CONSULT NOTE  Chief Complaint:  Follow-up dyslipidemia  Primary Care Physician: Allwardt, Randa Evens, PA-C  Primary Cardiologist:  None  HPI:  Kim Clements is a 56 y.o. female who is being seen today for the evaluation of dyslipidemia at the request of Allwardt, Alyssa M, PA-C.  This is a pleasant 56 year old female kindly referred for evaluation management of dyslipidemia.  She reports a family history of heart disease including her mother who had an MI at age 3 and both parents who have high cholesterol.  Her father died of a stroke suddenly at age 38.  Her recent cholesterol profile showed total of 211, HDL 55, triglycerides 77 and LDL 142.  Unfortunately, she could not tolerate statins causing significant myalgias.  She did have some abdominal imaging which showed aortic atherosclerosis.  She suffers from IBS and is on Viberzi.  She has no other traditional cardiovascular risk factors other than family history of early onset heart disease.  06/25/2022  Kim Clements returns today for follow-up.  She has had a significant reduction in her cholesterol and Nexletol.  Her LDL is gone down from 142 to 85 with LDL particle number of 952.  She did have some coronary calcium with a score of 17 however this was significantly age advanced and represents 83rd percentile for age and sex matched controls suggestive of early onset heart disease.  This is possibly explained by an elevated LP(a) which was noted at 85.8 nmol/L.  Based on this I would target her LDL to less than 70 which she is still above.  We discussed options for management and the best would be to consider PCSK9 inhibitor which would help her to reach a target in addition to her therapy since she cannot tolerate statins and potentially give her some benefit in her LP(a) elevation.  PMHx:  Past Medical History:  Diagnosis Date   Anal fissure    Anxiety    Arthritis    Asthma    Back pain    Colon polyps     Diverticulosis    Edema, lower extremity    GERD (gastroesophageal reflux disease)    HLD (hyperlipidemia)    IBS (irritable bowel syndrome)    Joint pain    Lactose intolerance    Osteoarthritis    Vitamin D deficiency     Past Surgical History:  Procedure Laterality Date   ABDOMINAL HYSTERECTOMY  2007   LAPAROSCOPY  1988   Laser. Indication: Endometriosis.   TONSILLECTOMY  1971    FAMHx:  Family History  Problem Relation Age of Onset   Colon polyps Mother    Heart disease Mother    Rheum arthritis Mother    Osteoarthritis Mother    High blood pressure Mother    Anxiety disorder Mother    Colon polyps Father    Stroke Father    High blood pressure Father    High Cholesterol Father    Heart disease Father    Heart disease Maternal Grandmother    Heart disease Maternal Grandfather    Breast cancer Paternal Grandmother        all of paternal females   Prostate cancer Paternal Grandfather    Heart disease Paternal Grandfather    Breast cancer Paternal Aunt    Colon cancer Neg Hx     SOCHx:   reports that she has never smoked. She has never used smokeless tobacco. She reports that she does not drink alcohol and does not  use drugs.  ALLERGIES:  Allergies  Allergen Reactions   Erythromycin Rash   Penicillins Hives   Sulfa Antibiotics Rash    ROS: Pertinent items noted in HPI and remainder of comprehensive ROS otherwise negative.  HOME MEDS: Current Outpatient Medications on File Prior to Visit  Medication Sig Dispense Refill   AMBULATORY NON FORMULARY MEDICATION Allergy Shots 2 shots per week     amitriptyline (ELAVIL) 10 MG tablet TAKE 1 TABLET BY MOUTH EVERY DAY AT NIGHT 90 tablet 0   Bempedoic Acid (NEXLETOL) 180 MG TABS Take 1 tablet by mouth daily. 90 tablet 3   bismuth subsalicylate (PEPTO BISMOL) 262 MG/15ML suspension Take 30 mLs by mouth as needed.     calcium carbonate (TUMS - DOSED IN MG ELEMENTAL CALCIUM) 500 MG chewable tablet Chew 1 tablet by  mouth daily.     Cholecalciferol (VITAMIN D3) 25 MCG (1000 UT) CAPS Take 4 capsules by mouth daily. 4000 IU/daily     Efinaconazole (JUBLIA) 10 % SOLN Apply 1 application topically daily.     Eluxadoline (VIBERZI) 75 MG TABS Take 1 tablet by mouth 2 (two) times daily. 60 tablet 3   EPINEPHrine 0.3 mg/0.3 mL IJ SOAJ injection SMARTSIG:IM As Directed PRN     fexofenadine (ALLEGRA) 180 MG tablet Take 180 mg by mouth daily.     Loperamide HCl (IMODIUM A-D PO) Take by mouth as needed.     Simethicone (GAS-X PO) Take by mouth as needed.     No current facility-administered medications on file prior to visit.    LABS/IMAGING: No results found for this or any previous visit (from the past 48 hour(s)).  No results found.  LIPID PANEL:    Component Value Date/Time   CHOL 150 06/17/2022 1032   TRIG 59 06/17/2022 1032   HDL 57 06/17/2022 1032   CHOLHDL 2.6 06/17/2022 1032   CHOLHDL 4 12/25/2020 1130   VLDL 24.2 12/25/2020 1130   LDLCALC 81 06/17/2022 1032    WEIGHTS: Wt Readings from Last 3 Encounters:  06/25/22 150 lb 12.8 oz (68.4 kg)  06/22/22 150 lb 6.4 oz (68.2 kg)  06/17/22 145 lb (65.8 kg)    VITALS: BP 128/80 (BP Location: Right Arm, Patient Position: Sitting, Cuff Size: Normal)   Pulse 67   Ht 5\' 1"  (1.549 m)   Wt 150 lb 12.8 oz (68.4 kg)   SpO2 100%   BMI 28.49 kg/m   EXAM: Deferred  EKG: N/A  ASSESSMENT: Mixed dyslipidemia Family history of premature coronary artery disease Statin myalgia Aortic atherosclerosis 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  Her LDL cholesterol is still above a target less than 70 as she has evidence of ASCVD, aortic atherosclerosis and an elevated calcium score for her age.  Given these factors she needs more aggressive therapy and would benefit from a PCSK9 inhibitor in addition to Nexletol since she  cannot tolerate statins.  Ezetimibe was considered however she has a number of GI issues which would potentially be worsened by this.  We will proceed with prior authorization.  Then plan repeat lipids and LP(a) in about 3 to 4 months.  Pixie Casino, MD, [facility], [place] Director of the Advanced Lipid Disorders &  Cardiovascular Risk Reduction Clinic Diplomate of the American Board of Clinical Lipidology Attending Cardiologist  Direct  DialRQ:[phone]  [phone]  Website:  [email or web address] Fremon Zacharia 06/25/2022, 3:09 PM

## 2022-06-28 NOTE — Progress Notes (Signed)
Chief Complaint:   OBESITY Kim Clements is here to discuss her progress with her obesity treatment plan along with follow-up of her obesity related diagnoses. Kim Clements is on the Category 1 Plan and states she is following her eating plan approximately 90% of the time. Kim Clements states she is walking 20-40 minutes 2-3 times per week.  Today's visit was #: 31 Starting weight: 170 lbs Starting date: 11/001/2022 Today's weight: 145 lbs Today's date: 06/17/2022 Total lbs lost to date: 25 lbs Total lbs lost since last in-office visit: 4 lbs  Interim History:  Since last office visit she has VERY CONSISTENTLY tracked her intake.  She is down 4 lbs.    She endorses recent constipation, denies hematochezia.   She does not have a family history of colon cancer.   She had to purchase new jeans, size 10.   Her ultimate goal size is a 8.   Visceral adipose rating when starting HWW was 11, today it is 8.  Of Note- Husband at [facility] during [place]. Subjective:   1. Other constipation She endorses recent constipation, denies hematochezia.   She does not have a family history of colon cancer.   2. Mixed hyperlipidemia Cardiology started her on Nexletol- she denies medication SE.  3. Vitamin D deficiency She is on OTC Vitamin D3, 1,000 IU.  4 capsules =4,000 IU daily.   4. Prediabetes She denies 1st degree family history of T2DM.  She is not currently on any anti-diabetic medications.  Assessment/Plan:   1. Other constipation Increase water intake and try OTC stool softner.   2. Mixed hyperlipidemia Check labs today.   - Comprehensive metabolic panel - Lipid panel - NMR, lipoprofile-ORDERED BY CARDS - Lipoprotein A (LPA)- ORDERED BY CARDS  3. Vitamin D deficiency Check labs today.   - VITAMIN D 25 Hydroxy (Vit-D Deficiency, Fractures)  4. Prediabetes Check labs today.   - Hemoglobin A1c - Insulin, random  5. Obesity, current BMI 27.5 Kim Clements is currently in the action  stage of change. As such, her goal is to continue with weight loss efforts. She has agreed to the Category 1 Plan.   Exercise goals:  As is.   Behavioral modification strategies: increasing lean protein intake, decreasing simple carbohydrates, meal planning and cooking strategies, keeping healthy foods in the home, and planning for success.  Kim Clements has agreed to follow-up with our clinic in 4 weeks. She was informed of the importance of frequent follow-up visits to maximize her success with intensive lifestyle modifications for her multiple health conditions.   Kim Clements was informed we would discuss her lab results at her next visit unless there is a critical issue that needs to be addressed sooner. Kim Clements agreed to keep her next visit at the agreed upon time to discuss these results.  Objective:   Blood pressure 128/85, pulse 78, temperature (!) 97.4 F (36.3 C), height 5\' 1"  (1.549 m), weight 145 lb (65.8 kg), SpO2 100 %. Body mass index is 27.4 kg/m.  General: Cooperative, alert, well developed, in no acute distress. HEENT: Conjunctivae and lids unremarkable. Cardiovascular: Regular rhythm.  Lungs: Normal work of breathing. Neurologic: No focal deficits.   Lab Results  Component Value Date   CREATININE 0.82 06/17/2022   BUN 20 06/17/2022   NA 141 06/17/2022   K 4.5 06/17/2022   CL 103 06/17/2022   CO2 23 06/17/2022   Lab Results  Component Value Date   ALT 38 (H) 06/17/2022   AST 31 06/17/2022  ALKPHOS 51 06/17/2022   BILITOT 0.5 06/17/2022   Lab Results  Component Value Date   HGBA1C 5.7 (H) 06/17/2022   HGBA1C 5.3 03/04/2022   HGBA1C 5.7 (H) 10/22/2021   HGBA1C 5.5 07/07/2021   Lab Results  Component Value Date   INSULIN 3.1 06/17/2022   INSULIN 4.0 03/04/2022   INSULIN 9.0 10/22/2021   INSULIN 9.4 07/07/2021   Lab Results  Component Value Date   TSH 2.05 12/25/2020   Lab Results  Component Value Date   CHOL 150 06/17/2022   HDL 57 06/17/2022    LDLCALC 81 06/17/2022   TRIG 59 06/17/2022   CHOLHDL 2.6 06/17/2022   Lab Results  Component Value Date   VD25OH 54.1 06/17/2022   VD25OH 73.6 03/04/2022   VD25OH 61.5 10/22/2021   Lab Results  Component Value Date   WBC 6.2 12/25/2020   HGB 14.3 12/25/2020   HCT 41.9 12/25/2020   MCV 88.3 12/25/2020   PLT 260.0 12/25/2020   No results found for: "IRON", "TIBC", "FERRITIN"  Attestation Statements:   Reviewed by clinician on day of visit: allergies, medications, problem list, medical history, surgical history, family history, social history, and previous encounter notes.  I, Davy Pique, RMA, am acting as Location manager for Mina Marble, NP.  I have reviewed the above documentation for accuracy and completeness, and I agree with the above. -  Cheston Coury d. Ianmichael Amescua, NP-C

## 2022-06-29 NOTE — Telephone Encounter (Signed)
PA submitted for Repatha (Key: [id number]) on Jun 29 2022.

## 2022-07-01 NOTE — Telephone Encounter (Signed)
Prior auth approved through 06/29/23. LVM for patient to call back to confirm pharmacy and to advise she get a copay card.

## 2022-07-02 MED ORDER — REPATHA SURECLICK 140 MG/ML ~~LOC~~ SOAJ: 1.0000 mL | SUBCUTANEOUS | 11 refills | Status: DC

## 2022-07-02 NOTE — Telephone Encounter (Signed)
Spoke with patient. Made her aware of below.

## 2022-07-13 ENCOUNTER — Encounter (INDEPENDENT_AMBULATORY_CARE_PROVIDER_SITE_OTHER): Payer: Self-pay | Admitting: Family Medicine

## 2022-07-13 ENCOUNTER — Ambulatory Visit (INDEPENDENT_AMBULATORY_CARE_PROVIDER_SITE_OTHER): Payer: 59 | Admitting: Family Medicine

## 2022-07-13 VITALS — BP 125/80 | HR 73 | Temp 98.2°F | Ht 61.0 in | Wt 145.6 lb

## 2022-07-13 DIAGNOSIS — E669 Obesity, unspecified: Secondary | ICD-10-CM

## 2022-07-13 DIAGNOSIS — R7303 Prediabetes: Secondary | ICD-10-CM | POA: Diagnosis not present

## 2022-07-13 DIAGNOSIS — E559 Vitamin D deficiency, unspecified: Secondary | ICD-10-CM

## 2022-07-13 DIAGNOSIS — Z6827 Body mass index (BMI) 27.0-27.9, adult: Secondary | ICD-10-CM

## 2022-07-13 DIAGNOSIS — E7849 Other hyperlipidemia: Secondary | ICD-10-CM

## 2022-07-15 ENCOUNTER — Ambulatory Visit (INDEPENDENT_AMBULATORY_CARE_PROVIDER_SITE_OTHER): Payer: 59 | Admitting: Family Medicine

## 2022-07-27 NOTE — Progress Notes (Signed)
Chief Complaint:   OBESITY Kim Clements is here to discuss her progress with her obesity treatment plan along with follow-up of her obesity related diagnoses. Kim Clements is on the Category 2 Plan and keeping a food journal and adhering to recommended goals of 1200 calories and 80 grams of protein daily and states she is following her eating plan approximately 95% of the time. [name] states she is not currently exercising.  Today's visit was #: 18 Starting weight: 170 lbs Starting date: 07/07/2021 Today's weight: 145 lbs Today's date: 07/13/2022 Total lbs lost to date: 25 Total lbs lost since last in-office visit: 0  Interim History: [name] is weighing herself at home daily. This is her first office visit with me in many months. We obtained labs at her last office visit, and she is here to review them all.   Subjective:   1. Other hyperlipidemia Kim Clements sees Dr. Rennis Golden for management. Extra labs will be obtained today by Cardiology. She is taking Nexletol and Repatha. She has a strong family history of  early CAD.    2. Vitamin D deficiency Kim Clements is taking Vitamin D 4,000 IU daily. She has no issues and she notes fatigue has improved from prior.   3. Prediabetes Kim Clements's A1c was 5.3 four months ago and is now 5.7, and her fasting insulin has improved some.   Assessment/Plan:  No orders of the defined types were placed in this encounter.   There are no discontinued medications.   No orders of the defined types were placed in this encounter.    1. Other hyperlipidemia Kim Clements's FLP has improved, and additional labs work will be obtained. She will have to discuss with Dr. Rennis Golden. She will continue medication management by Dr. Rennis Golden, continue her prudent nutritional plan and increase exercise.   2. Vitamin D deficiency Kim Clements's Vitamin D level is at goal. She will continue her Vitamin D supplementation at her current dose. Low Vitamin D level contributes to  fatigue and are associated with obesity, breast, and colon cancer. She will follow-up for routine testing of Vitamin D, at least 2-3 times per year to avoid over-replacement.  3. Prediabetes Kim Clements will continue to work on weight loss, exercise, and decreasing simple carbohydrates to help decrease the risk of diabetes.   4. Obesity, current BMI 27.5 Kim Clements is currently in the action stage of change. As such, her goal is to continue with weight loss efforts. She has agreed to the Category 1 Plan.   Patient is with several questions regarding lab findings. Foods influence the values of CBC, CMP, A1C, fasting insulin, FLP, and Vitamin D. Counseling was done.   Exercise goals: As is.   Behavioral modification strategies: increasing lean protein intake and decreasing simple carbohydrates.  Kim Clements has agreed to follow-up with our clinic in 3 weeks. She was informed of the importance of frequent follow-up visits to maximize her success with intensive lifestyle modifications for her multiple health conditions.   Objective:   Blood pressure 125/80, pulse 73, temperature 98.2 F (36.8 C), height 5\' 1"  (1.549 m), weight 145 lb 9.6 oz (66 kg), SpO2 (!) 9 %. Body mass index is 27.51 kg/m.  General: Cooperative, alert, well developed, in no acute distress. HEENT: Conjunctivae and lids unremarkable. Cardiovascular: Regular rhythm.  Lungs: Normal work of breathing. Neurologic: No focal deficits.   Lab Results  Component Value Date   CREATININE 0.82 06/17/2022   BUN 20 06/17/2022   NA 141 06/17/2022   K 4.5 06/17/2022  CL 103 06/17/2022   CO2 23 06/17/2022   Lab Results  Component Value Date   ALT 38 (H) 06/17/2022   AST 31 06/17/2022   ALKPHOS 51 06/17/2022   BILITOT 0.5 06/17/2022   Lab Results  Component Value Date   HGBA1C 5.7 (H) 06/17/2022   HGBA1C 5.3 03/04/2022   HGBA1C 5.7 (H) 10/22/2021   HGBA1C 5.5 07/07/2021   Lab Results  Component Value Date   INSULIN 3.1  06/17/2022   INSULIN 4.0 03/04/2022   INSULIN 9.0 10/22/2021   INSULIN 9.4 07/07/2021   Lab Results  Component Value Date   TSH 2.05 12/25/2020   Lab Results  Component Value Date   CHOL 150 06/17/2022   HDL 57 06/17/2022   LDLCALC 81 06/17/2022   TRIG 59 06/17/2022   CHOLHDL 2.6 06/17/2022   Lab Results  Component Value Date   VD25OH 54.1 06/17/2022   VD25OH 73.6 03/04/2022   VD25OH 61.5 10/22/2021   Lab Results  Component Value Date   WBC 6.2 12/25/2020   HGB 14.3 12/25/2020   HCT 41.9 12/25/2020   MCV 88.3 12/25/2020   PLT 260.0 12/25/2020   No results found for: "IRON", "TIBC", "FERRITIN"  Attestation Statements:   Reviewed by clinician on day of visit: allergies, medications, problem list, medical history, surgical history, family history, social history, and previous encounter notes.   Trude Mcburney, am acting as transcriptionist for [employer], DO.  I have reviewed the above documentation for accuracy and completeness, and I agree with the above. Carlye Grippe, D.O.  The 21st Century Cures Act was signed into law in 2016 which includes the topic of electronic health records.  This provides immediate access to information in MyChart.  This includes consultation notes, operative notes, office notes, lab results and pathology reports.  If you have any questions about what you read please let us know at your next visit so we can discuss your concerns and take corrective action if need be.  We are right here with you.

## 2022-08-02 ENCOUNTER — Encounter: Payer: Self-pay | Admitting: Physician Assistant

## 2022-08-03 NOTE — Telephone Encounter (Signed)
Please see pt message/advise request and advise

## 2022-08-05 ENCOUNTER — Ambulatory Visit: Payer: 59 | Admitting: Gastroenterology

## 2022-08-10 ENCOUNTER — Ambulatory Visit: Payer: 59 | Admitting: Nurse Practitioner

## 2022-08-12 ENCOUNTER — Ambulatory Visit (INDEPENDENT_AMBULATORY_CARE_PROVIDER_SITE_OTHER): Payer: 59 | Admitting: Physician Assistant

## 2022-08-12 ENCOUNTER — Encounter (INDEPENDENT_AMBULATORY_CARE_PROVIDER_SITE_OTHER): Payer: Self-pay | Admitting: Physician Assistant

## 2022-08-12 VITALS — BP 112/73 | HR 102 | Temp 98.4°F | Ht 61.0 in | Wt 142.0 lb

## 2022-08-12 DIAGNOSIS — E7849 Other hyperlipidemia: Secondary | ICD-10-CM | POA: Diagnosis not present

## 2022-08-12 DIAGNOSIS — R7303 Prediabetes: Secondary | ICD-10-CM

## 2022-08-12 DIAGNOSIS — Z6826 Body mass index (BMI) 26.0-26.9, adult: Secondary | ICD-10-CM | POA: Diagnosis not present

## 2022-08-12 DIAGNOSIS — E669 Obesity, unspecified: Secondary | ICD-10-CM

## 2022-08-19 ENCOUNTER — Encounter: Payer: Self-pay | Admitting: *Deleted

## 2022-08-26 NOTE — Progress Notes (Signed)
Chief Complaint:   OBESITY Meghin is here to discuss her progress with her obesity treatment plan along with follow-up of her obesity related diagnoses. Stefany is on the Category 2 Plan and states she is following her eating plan approximately 85% of the time. Lakiah states she is walking 20 minutes 2-3 times per week.  Today's visit was #: 19 Starting weight: 170 lbs Starting date: 07/07/2021 Today's weight: 142 lbs Today's date: 08/12/2022 Total lbs lost to date: 28 lbs Total lbs lost since last in-office visit: 3  Interim History: Leydy has done well with weight loss. Did well being mindful over Thanksgiving, focusing on getting adequate protein.    Ultimate goal: 135-140 lbs.  Subjective:   1. Prediabetes A1c at 5.7 on 06/17/22- nearing goal. Doing well with eating plan to decrease simple carbohydrates(rarely drinks a sweet tea now), decreasing saturated fats, increasing lean proteins and exercise to promote weight loss/decrease insulin resistance.  2. Other hyperlipidemia Due to start of Repatha and taking Nexletol due to strong family history of early CAD. Sees Dr. Rennis Golden for labs with further breakdown and medications for hyperlipidemia.  Assessment/Plan:   1. Prediabetes Continue healthy eating plan  to decrease simple carbohydrates, decreasing saturated fats, increasing lean proteins and exercise to promote weight loss/decrease insulin resistance.  2. Other hyperlipidemia To start Repatha per Dr. Hilty--cardiology. Continue medications--to follow up with cardiology early Feb 2024.  3. Obesity, current BMI 26.9 Vonnetta is currently in the action stage of change. As such, her goal is to continue with weight loss efforts. She has agreed to the Category 1 Plan.   Exercise goals: As is. Add upper body strengthening.  Behavioral modification strategies: increasing lean protein intake, decreasing simple carbohydrates, increasing water intake, and holiday  eating strategies .  Dondi has agreed to follow-up with our clinic in 5 weeks. She was informed of the importance of frequent follow-up visits to maximize her success with intensive lifestyle modifications for her multiple health conditions.   Objective:   Blood pressure 112/73, pulse (!) 102, temperature 98.4 F (36.9 C), height 5\' 1"  (1.549 m), weight 142 lb (64.4 kg), SpO2 97 %. Body mass index is 26.83 kg/m.  General: Cooperative, alert, well developed, in no acute distress. HEENT: Conjunctivae and lids unremarkable. Cardiovascular: Regular rhythm.  Lungs: Normal work of breathing. Neurologic: No focal deficits.   Lab Results  Component Value Date   CREATININE 0.82 06/17/2022   BUN 20 06/17/2022   NA 141 06/17/2022   K 4.5 06/17/2022   CL 103 06/17/2022   CO2 23 06/17/2022   Lab Results  Component Value Date   ALT 38 (H) 06/17/2022   AST 31 06/17/2022   ALKPHOS 51 06/17/2022   BILITOT 0.5 06/17/2022   Lab Results  Component Value Date   HGBA1C 5.7 (H) 06/17/2022   HGBA1C 5.3 03/04/2022   HGBA1C 5.7 (H) 10/22/2021   HGBA1C 5.5 07/07/2021   Lab Results  Component Value Date   INSULIN 3.1 06/17/2022   INSULIN 4.0 03/04/2022   INSULIN 9.0 10/22/2021   INSULIN 9.4 07/07/2021   Lab Results  Component Value Date   TSH 2.05 12/25/2020   Lab Results  Component Value Date   CHOL 150 06/17/2022   HDL 57 06/17/2022   LDLCALC 81 06/17/2022   TRIG 59 06/17/2022   CHOLHDL 2.6 06/17/2022   Lab Results  Component Value Date   VD25OH 54.1 06/17/2022   VD25OH 73.6 03/04/2022   VD25OH 61.5 10/22/2021  Lab Results  Component Value Date   WBC 6.2 12/25/2020   HGB 14.3 12/25/2020   HCT 41.9 12/25/2020   MCV 88.3 12/25/2020   PLT 260.0 12/25/2020   No results found for: "IRON", "TIBC", "FERRITIN"  Attestation Statements:   Reviewed by clinician on day of visit: allergies, medications, problem list, medical history, surgical history, family history, social  history, and previous encounter notes.  I, Brendell Tyus, am acting as transcriptionist for [employer], PA.  I have reviewed the above documentation for accuracy and completeness, and I agree with the above. -  Talene Glastetter,PA-C

## 2022-09-08 ENCOUNTER — Encounter: Payer: Self-pay | Admitting: Gastroenterology

## 2022-09-10 ENCOUNTER — Other Ambulatory Visit: Payer: Self-pay

## 2022-09-10 MED ORDER — VIBERZI 75 MG PO TABS: 1.0000 | ORAL_TABLET | Freq: Two times a day (BID) | ORAL | 0 refills | Status: DC

## 2022-09-13 ENCOUNTER — Other Ambulatory Visit: Payer: Self-pay | Admitting: *Deleted

## 2022-09-13 ENCOUNTER — Other Ambulatory Visit: Payer: Self-pay | Admitting: Gastroenterology

## 2022-09-13 MED ORDER — VIBERZI 75 MG PO TABS: 1.0000 | ORAL_TABLET | Freq: Two times a day (BID) | ORAL | 0 refills | Status: DC

## 2022-09-13 NOTE — Progress Notes (Signed)
Through patient advise request we printed prescription and had Dr Silverio Decamp to sign

## 2022-09-14 ENCOUNTER — Ambulatory Visit (INDEPENDENT_AMBULATORY_CARE_PROVIDER_SITE_OTHER): Payer: 59 | Admitting: Adult Health

## 2022-09-16 ENCOUNTER — Ambulatory Visit (INDEPENDENT_AMBULATORY_CARE_PROVIDER_SITE_OTHER): Payer: 59 | Admitting: Adult Health

## 2022-09-16 ENCOUNTER — Encounter (INDEPENDENT_AMBULATORY_CARE_PROVIDER_SITE_OTHER): Payer: Self-pay | Admitting: Adult Health

## 2022-09-16 VITALS — BP 133/78 | HR 74 | Temp 98.0°F | Ht 61.0 in | Wt 142.0 lb

## 2022-09-16 DIAGNOSIS — Z6826 Body mass index (BMI) 26.0-26.9, adult: Secondary | ICD-10-CM | POA: Diagnosis not present

## 2022-09-16 DIAGNOSIS — E669 Obesity, unspecified: Secondary | ICD-10-CM | POA: Diagnosis not present

## 2022-09-16 DIAGNOSIS — E782 Mixed hyperlipidemia: Secondary | ICD-10-CM

## 2022-09-16 DIAGNOSIS — E559 Vitamin D deficiency, unspecified: Secondary | ICD-10-CM

## 2022-09-19 ENCOUNTER — Other Ambulatory Visit: Payer: Self-pay | Admitting: Physician Assistant

## 2022-09-19 DIAGNOSIS — G8929 Other chronic pain: Secondary | ICD-10-CM

## 2022-09-23 NOTE — Progress Notes (Signed)
Chief Complaint:   OBESITY Kim Clements is here to discuss her progress with her obesity treatment plan along with follow-up of her obesity related diagnoses. Kim Clements is on the Category 1 Plan and states she is following her eating plan approximately 70% of the time. Kim Clements states she is not exercising.  Today's visit was #: 51 Starting weight: 170 LBS Starting date: 07/07/2021 Today's weight: 142 LBS Today's date: 09/16/2022 Total lbs lost to date: 28 LBS Total lbs lost since last in-office visit: 0  Interim History:  Health Goals for 2024 1) Resume regular walking regime 2) Current weight 142 lbs, goal weight 135-140 lbs  Her 19 year daughter was ill with influenza over Christmas holiday. She lives with her husband and their daughter.  Of Note- Her husband "Kim Clements" at [facility] during [place].  Subjective:   1. Vitamin D deficiency On 06/17/2022 vitamin D level 54.1.   She is on OTC vitamin D3 1000 IU, taking 4 capsules equaling 4000 IU daily.   Patient endorses stable energy level.  2. Mixed hyperlipidemia Patient has yet to start [place], has prescription at home.   She is taking daily Nexletol 180 mg.   She continues to reduce saturated fat intake and remain tobacco/vape free.  Assessment/Plan:   1. Vitamin D deficiency Continue daily supplement.  2. Mixed hyperlipidemia Continue Nexletol, start Repatha.  3. Obesity, current BMI 26.9 Kim Clements is currently in the action stage of change. As such, her goal is to continue with weight loss efforts. She has agreed to the Category 1 Plan.   Exercise goals:  As is.  Behavioral modification strategies: increasing lean protein intake, decreasing simple carbohydrates, meal planning and cooking strategies, keeping healthy foods in the home, and planning for success.  Kim Clements has agreed to follow-up with our clinic in 4 weeks. She was informed of the importance of frequent follow-up visits to maximize her success with  intensive lifestyle modifications for her multiple health conditions.   Objective:   Blood pressure 133/78, pulse 74, temperature 98 F (36.7 C), height 5\' 1"  (1.549 m), weight 142 lb (64.4 kg), SpO2 100 %. Body mass index is 26.83 kg/m.  General: Cooperative, alert, well developed, in no acute distress. HEENT: Conjunctivae and lids unremarkable. Cardiovascular: Regular rhythm.  Lungs: Normal work of breathing. Neurologic: No focal deficits.   Lab Results  Component Value Date   CREATININE 0.82 06/17/2022   BUN 20 06/17/2022   NA 141 06/17/2022   K 4.5 06/17/2022   CL 103 06/17/2022   CO2 23 06/17/2022   Lab Results  Component Value Date   ALT 38 (H) 06/17/2022   AST 31 06/17/2022   ALKPHOS 51 06/17/2022   BILITOT 0.5 06/17/2022   Lab Results  Component Value Date   HGBA1C 5.7 (H) 06/17/2022   HGBA1C 5.3 03/04/2022   HGBA1C 5.7 (H) 10/22/2021   HGBA1C 5.5 07/07/2021   Lab Results  Component Value Date   INSULIN 3.1 06/17/2022   INSULIN 4.0 03/04/2022   INSULIN 9.0 10/22/2021   INSULIN 9.4 07/07/2021   Lab Results  Component Value Date   TSH 2.05 12/25/2020   Lab Results  Component Value Date   CHOL 150 06/17/2022   HDL 57 06/17/2022   LDLCALC 81 06/17/2022   TRIG 59 06/17/2022   CHOLHDL 2.6 06/17/2022   Lab Results  Component Value Date   VD25OH 54.1 06/17/2022   VD25OH 73.6 03/04/2022   VD25OH 61.5 10/22/2021   Lab Results  Component Value  Date   WBC 6.2 12/25/2020   HGB 14.3 12/25/2020   HCT 41.9 12/25/2020   MCV 88.3 12/25/2020   PLT 260.0 12/25/2020   No results found for: "IRON", "TIBC", "FERRITIN"  Attestation Statements:   Reviewed by clinician on day of visit: allergies, medications, problem list, medical history, surgical history, family history, social history, and previous encounter notes.  I, Kim Clements, RMA, am acting as Location manager for Kim Marble, NP.  I have reviewed the above documentation for accuracy and  completeness, and I agree with the above. -  Kim Clements d. Kim Bera, NP-C

## 2022-09-28 DIAGNOSIS — E785 Hyperlipidemia, unspecified: Secondary | ICD-10-CM

## 2022-10-06 NOTE — Telephone Encounter (Signed)
Spoke with patient of Dr. Debara Pickett. She cancelled her 10/11/22 appointment as she has NOT started Repatha. She said she was nervous to start it and then her family got sick and she meant to call our office about this but never did. Rx was approved back in October 2023. Her appointment has been moved to May and she was scheduled for a pharmacist appt for her 1st injection, per her request. Lab work reordered and mailed.

## 2022-10-07 ENCOUNTER — Ambulatory Visit (INDEPENDENT_AMBULATORY_CARE_PROVIDER_SITE_OTHER): Payer: 59 | Admitting: Gastroenterology

## 2022-10-07 VITALS — BP 128/64 | HR 70 | Ht 61.5 in | Wt 147.0 lb

## 2022-10-07 DIAGNOSIS — K580 Irritable bowel syndrome with diarrhea: Secondary | ICD-10-CM | POA: Diagnosis not present

## 2022-10-07 MED ORDER — VIBERZI 75 MG PO TABS: 1.0000 | ORAL_TABLET | Freq: Two times a day (BID) | ORAL | 3 refills | Status: DC

## 2022-10-07 NOTE — Patient Instructions (Signed)
We have given you a printed prescription of Viberzi signed by Dr Silverio Decamp to take to your pharmacy  Follow up in 1 year  _______________________________________________________  If your blood pressure at your visit was 140/90 or greater, please contact your primary care physician to follow up on this.  _______________________________________________________  If you are age 58 or older, your body mass index should be between 23-30. Your Body mass index is 27.33 kg/m. If this is out of the aforementioned range listed, please consider follow up with your Primary Care Provider.  If you are age 43 or younger, your body mass index should be between 19-25. Your Body mass index is 27.33 kg/m. If this is out of the aformentioned range listed, please consider follow up with your Primary Care Provider.   ________________________________________________________  The [facility] providers would like to encourage you to use [facility] to communicate with providers for non-urgent requests or questions.  Due to long hold times on the telephone, sending your provider a message by [facility] may be a faster and more efficient way to get a response.  Please allow 48 business hours for a response.  Please remember that this is for non-urgent requests.  _______________________________________________________   I appreciate the  opportunity to care for you  Thank You   Harl Bowie , MD

## 2022-10-07 NOTE — Progress Notes (Signed)
Kim Clements    EP:[id number]    Sep 20, 1965  Primary Care Physician:Allwardt, Randa Evens, PA-C  Referring Physician: Allwardt, Randa Evens, PA-C [place],  [place] [id number]   Chief complaint:  IBS  HPI:  57 year old very pleasant female with history of irritable bowel syndrome predominant diarrhea here for follow-up visit She is taking Viberzi 75 mg twice daily. She is having overall improvement of symptoms with occasional diarrhea once every few months.  She is avoiding lactose.  Denies abdominal pain, nausea, vomiting, dysphagia, loss of appetite or weight loss. Overall she feels well and has no specific GI complaints.     Colonoscopy February 12, 2016 showed sigmoid diverticulosis internal hemorrhoids otherwise normal exam   Colonoscopy Jan 09, 2010 by Dr. Watt Climes at [facility] sigmoid diverticulosis otherwise normal exam   Colonoscopy October 20 2004 by Dr. Barron [facility].  Terminal ileum biopsies normal mucosa.  2 small polypoid appearing polyps were removed with biopsy forceps showed benign colonic mucosa.  Sigmoid diverticulosis     Outpatient Encounter Medications as of 10/07/2022  Medication Sig   AMBULATORY NON FORMULARY MEDICATION Allergy Shots 2 shots per week   amitriptyline (ELAVIL) 10 MG tablet TAKE 1 TABLET BY MOUTH EVERY DAY AT NIGHT   Bempedoic Acid (NEXLETOL) 180 MG TABS Take 1 tablet by mouth daily.   bismuth subsalicylate (PEPTO BISMOL) 262 MG/15ML suspension Take 30 mLs by mouth as needed.   calcium carbonate (TUMS - DOSED IN MG ELEMENTAL CALCIUM) 500 MG chewable tablet Chew 1 tablet by mouth daily.   Cholecalciferol (VITAMIN D3) 25 MCG (1000 UT) CAPS Take 4 capsules by mouth daily. 4000 IU/daily   EPINEPHrine 0.3 mg/0.3 mL IJ SOAJ injection SMARTSIG:IM As Directed PRN   fexofenadine (ALLEGRA) 180 MG tablet Take 180 mg by mouth daily.   itraconazole (SPORANOX) 10 MG/ML solution Take 10 mLs by mouth daily.   Loperamide HCl (IMODIUM  A-D PO) Take by mouth as needed.   Simethicone (GAS-X PO) Take by mouth as needed.   [DISCONTINUED] Eluxadoline (VIBERZI) 75 MG TABS Take 1 tablet by mouth 2 (two) times daily.   Eluxadoline (VIBERZI) 75 MG TABS Take 1 tablet (75 mg total) by mouth 2 (two) times daily.   Evolocumab (REPATHA SURECLICK) [id number] MG/ML SOAJ Inject 140 mg into the skin every 14 (fourteen) days. (Patient not taking: Reported on 09/16/2022)   No facility-administered encounter medications on file as of 10/07/2022.    Allergies as of 10/07/2022 - Review Complete 10/07/2022  Allergen Reaction Noted   Erythromycin Rash 08/22/2015   Penicillins Hives 08/22/2015   Sulfa antibiotics Rash 08/22/2015    Past Medical History:  Diagnosis Date   Anal fissure    Anxiety    Arthritis    Asthma    Back pain    Colon polyps    Diverticulosis    Edema, lower extremity    GERD (gastroesophageal reflux disease)    HLD (hyperlipidemia)    IBS (irritable bowel syndrome)    Joint pain    Lactose intolerance    Osteoarthritis    Vitamin D deficiency     Past Surgical History:  Procedure Laterality Date   ABDOMINAL HYSTERECTOMY  2007   LAPAROSCOPY  1988   Laser. Indication: Endometriosis.   TONSILLECTOMY  1971    Family History  Problem Relation Age of Onset   Colon polyps Mother    Heart disease Mother  Rheum arthritis Mother    Osteoarthritis Mother    High blood pressure Mother    Anxiety disorder Mother    Colon polyps Father    Stroke Father    High blood pressure Father    High Cholesterol Father    Heart disease Father    Heart disease Maternal Grandmother    Heart disease Maternal Grandfather    Breast cancer Paternal Grandmother        all of paternal females   Prostate cancer Paternal Grandfather    Heart disease Paternal Grandfather    Breast cancer Paternal Aunt    Colon cancer Neg Hx     Social History   Socioeconomic History   Marital status: Married    Spouse name: Not on file    Number of children: 1   Years of education: Not on file   Highest education level: Not on file  Occupational History   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   Occupation: stay at home mom, retired  Tobacco Use   Smoking status: Never   Smokeless tobacco: Never  Vaping Use   Vaping Use: Never used  Substance and Sexual Activity   Alcohol use: No    Alcohol/week: 0.0 standard drinks of alcohol   Drug use: No   Sexual activity: Not on file  Other Topics Concern   Not on file  Social History Narrative   Not on file   Social Determinants of Health   Financial Resource Strain: Not on file  Food Insecurity: Not on file  Transportation Needs: Not on file  Physical Activity: Not on file  Stress: Not on file  Social Connections: Not on file  Intimate Partner Violence: Not on file      Review of systems: All other review of systems negative except as mentioned in the HPI.   Physical Exam: Vitals:   10/07/22 1127  BP: 128/64  Pulse: 70   Body mass index is 27.33 kg/m. Gen:      No acute distress HEENT:  sclera anicteric Abd:      soft, non-tender; no palpable masses, no distension Ext:    No edema Neuro: alert and oriented x 3 Psych: normal mood and affect  Data Reviewed:  Reviewed labs, radiology imaging, old records and pertinent past GI work up   Assessment and Plan/Recommendations:  57 year old very pleasant female with irritable bowel syndrome predominant diarrhea here for follow-up visit   IBS-D:  Continue Viberzi to 75 mg twice daily.  Patient was provided refills for 1 year   Due for colonoscopy for surveillance in 2027   Return in 1 year or sooner if needed  The patient was provided an opportunity to ask questions and all were answered. The patient agreed with the plan and demonstrated an understanding of the instructions.  Damaris Hippo , MD    CC: Allwardt, Randa Evens, PA-C

## 2022-10-11 ENCOUNTER — Ambulatory Visit (HOSPITAL_BASED_OUTPATIENT_CLINIC_OR_DEPARTMENT_OTHER): Payer: 59 | Admitting: Internal Medicine

## 2022-10-12 ENCOUNTER — Encounter (INDEPENDENT_AMBULATORY_CARE_PROVIDER_SITE_OTHER): Payer: Self-pay | Admitting: Adult Health

## 2022-10-12 ENCOUNTER — Ambulatory Visit (INDEPENDENT_AMBULATORY_CARE_PROVIDER_SITE_OTHER): Payer: 59 | Admitting: Adult Health

## 2022-10-12 VITALS — BP 110/77 | HR 79 | Temp 97.7°F | Ht 61.0 in | Wt 143.0 lb

## 2022-10-12 DIAGNOSIS — E669 Obesity, unspecified: Secondary | ICD-10-CM

## 2022-10-12 DIAGNOSIS — E782 Mixed hyperlipidemia: Secondary | ICD-10-CM

## 2022-10-12 DIAGNOSIS — Z6827 Body mass index (BMI) 27.0-27.9, adult: Secondary | ICD-10-CM

## 2022-10-20 NOTE — Progress Notes (Signed)
Chief Complaint:   OBESITY Kim Clements is here to discuss her progress with her obesity treatment plan along with follow-up of her obesity related diagnoses. Kim Clements is on the Category 1 Plan and states she is following her eating plan approximately 75-80% of the time. Kim Clements states she is walking 20 minutes 3 times per week.  Today's visit was #: 21 Starting weight: 170 LBS Starting date: 07/07/2021 Today's weight: 143 LBS Today's date: 10/12/2022 Total lbs lost to date: 27 LBS Total lbs lost since last in-office visit: +1 LB  Interim History:  Kim Clements current weight is 143lbs Her goal weight is between 134-140 lbs  She estimates to consume 90 oz water/day.  She feels that following the prescribed meal plan was "harder" this interval since last OV on 09/16/22.  Reviewed Bioempedence Results with pt: Muscle Mass + 2.4 lbs Adipose Mass - 1.2 lbs  Of Note- Husband at [facility] during OV  Subjective:   1. Mixed hyperlipidemia Lipid Panel     Component Value Date/Time   CHOL 150 06/17/2022 1032   TRIG 59 06/17/2022 1032   HDL 57 06/17/2022 1032   CHOLHDL 2.6 06/17/2022 1032   CHOLHDL 4 12/25/2020 1130   VLDL 24.2 12/25/2020 1130   LDLCALC 81 06/17/2022 1032   LABVLDL 12 06/17/2022 1032    The 10-year ASCVD risk score (Arnett DK, et al., 2019) is: 1.4%   Values used to calculate the score:     Age: 57 years     Sex: Female     Is Non-Hispanic African American: No     Diabetic: No     Tobacco smoker: No     Systolic Blood Pressure: [id number] mmHg     Is BP treated: No     HDL Cholesterol: 57 mg/dL     Total Cholesterol: 156 mg/dL   Cardiology manages daily Nexletol 180 mg.   Patient has yet to start Repatha 140 mg every 14 days.  Patient will start after visit with Pharm.D. on 10/27/2022. She an her husband estimate to consume 2-3 servings of red meat a week- discussed at length the importance of reducing saturated fat intake.   Assessment/Plan:   1. Mixed  hyperlipidemia Take medication as directed. Limit red meat to MAX one serving a week.  2. Obesity, current BMI 27.2 Kim Clements is currently in the action stage of change. As such, her goal is to continue with weight loss efforts. She has agreed to the Category 1 Plan.   Exercise goals:  As is, but increase walking to 4 times a week.  Also try stationary bike and treadmill.  Behavioral modification strategies: increasing lean protein intake, decreasing simple carbohydrates, meal planning and cooking strategies, keeping healthy foods in the home, and planning for success.  Kim Clements has agreed to follow-up with our clinic in 4 weeks. She was informed of the importance of frequent follow-up visits to maximize her success with intensive lifestyle modifications for her multiple health conditions.   Objective:   Blood pressure 110/77, pulse 79, temperature 97.7 F (36.5 C), height 5' 1"$  (1.549 m), weight 143 lb (64.9 kg), SpO2 97 %. Body mass index is 27.02 kg/m.  General: Cooperative, alert, well developed, in no acute distress. HEENT: Conjunctivae and lids unremarkable. Cardiovascular: Regular rhythm.  Lungs: Normal work of breathing. Neurologic: No focal deficits.   Lab Results  Component Value Date   CREATININE 0.82 06/17/2022   BUN 20 06/17/2022   NA 141 06/17/2022   K 4.5  06/17/2022   CL 103 06/17/2022   CO2 23 06/17/2022   Lab Results  Component Value Date   ALT 38 (H) 06/17/2022   AST 31 06/17/2022   ALKPHOS 51 06/17/2022   BILITOT 0.5 06/17/2022   Lab Results  Component Value Date   HGBA1C 5.7 (H) 06/17/2022   HGBA1C 5.3 03/04/2022   HGBA1C 5.7 (H) 10/22/2021   HGBA1C 5.5 07/07/2021   Lab Results  Component Value Date   INSULIN 3.1 06/17/2022   INSULIN 4.0 03/04/2022   INSULIN 9.0 10/22/2021   INSULIN 9.4 07/07/2021   Lab Results  Component Value Date   TSH 2.05 12/25/2020   Lab Results  Component Value Date   CHOL 150 06/17/2022   HDL 57 06/17/2022    LDLCALC 81 06/17/2022   TRIG 59 06/17/2022   CHOLHDL 2.6 06/17/2022   Lab Results  Component Value Date   VD25OH 54.1 06/17/2022   VD25OH 73.6 03/04/2022   VD25OH 61.5 10/22/2021   Lab Results  Component Value Date   WBC 6.2 12/25/2020   HGB 14.3 12/25/2020   HCT 41.9 12/25/2020   MCV 88.3 12/25/2020   PLT 260.0 12/25/2020   No results found for: "IRON", "TIBC", "FERRITIN"  Attestation Statements:   Reviewed by clinician on day of visit: allergies, medications, problem list, medical history, surgical history, family history, social history, and previous encounter notes.  Time spent on visit including pre-visit chart review and post-visit care and charting was 47 minutes.   I, Kim Clements, RMA, am acting as Location manager for Kim Marble, NP.  I have reviewed the above documentation for accuracy and completeness, and I agree with the above. -  Kim Clements d. Kim Ivins, NP-C

## 2022-10-27 ENCOUNTER — Telehealth: Payer: Self-pay | Admitting: Internal Medicine

## 2022-10-27 ENCOUNTER — Ambulatory Visit: Payer: 59 | Attending: Cardiology | Admitting: Pharmacist Clinician (PhC)/ Clinical Pharmacy Specialist

## 2022-10-27 DIAGNOSIS — E785 Hyperlipidemia, unspecified: Secondary | ICD-10-CM

## 2022-10-27 DIAGNOSIS — E782 Mixed hyperlipidemia: Secondary | ICD-10-CM

## 2022-10-27 NOTE — Telephone Encounter (Signed)
Returned call to pt, advised she's just fine to exercise after giving Repatha injection.

## 2022-10-27 NOTE — Patient Instructions (Addendum)
Continue with your Repatha injections every 14 days.  Repeat labs in 3 months

## 2022-10-27 NOTE — Telephone Encounter (Signed)
Pt c/o medication issue:  1. Name of Medication: Evolocumab (REPATHA SURECLICK) XX123456 MG/ML SOAJ  2. How are you currently taking this medication (dosage and times per day)?  3. Are you having a reaction (difficulty breathing--STAT)?   4. What is your medication issue?   Patient would like to know if it would be alright to go walking after taking her injection. She states she forgot to ask during her appointment. She states her daughter takes and allergy injection and she has to wait a few hours.

## 2022-11-03 ENCOUNTER — Encounter: Payer: Self-pay | Admitting: Gastroenterology

## 2022-11-04 NOTE — Progress Notes (Signed)
   Office Visit    Kim Clements in the office today with her husband, to learn injection technique and give first dose of Repatha.    Accessory Clinical Findings   Lab Results  Component Value Date   CHOL 150 06/17/2022   HDL 57 06/17/2022   LDLCALC 81 06/17/2022   TRIG 59 06/17/2022   CHOLHDL 2.6 06/17/2022    Lab Results  Component Value Date   ALT 38 (H) 06/17/2022   AST 31 06/17/2022   ALKPHOS 51 06/17/2022   BILITOT 0.5 06/17/2022   Lab Results  Component Value Date   CREATININE 0.82 06/17/2022   BUN 20 06/17/2022   NA 141 06/17/2022   K 4.5 06/17/2022   CL 103 06/17/2022   CO2 23 06/17/2022   Lab Results  Component Value Date   HGBA1C 5.7 (H) 06/17/2022    Home Medications    Current Outpatient Medications  Medication Sig Dispense Refill   AMBULATORY NON FORMULARY MEDICATION Allergy Shots 2 shots per week     amitriptyline (ELAVIL) 10 MG tablet TAKE 1 TABLET BY MOUTH EVERY DAY AT NIGHT 90 tablet 0   Bempedoic Acid (NEXLETOL) 180 MG TABS Take 1 tablet by mouth daily. 90 tablet 3   bismuth subsalicylate (PEPTO BISMOL) 262 MG/15ML suspension Take 30 mLs by mouth as needed.     calcium carbonate (TUMS - DOSED IN MG ELEMENTAL CALCIUM) 500 MG chewable tablet Chew 1 tablet by mouth daily.     Cholecalciferol (VITAMIN D3) 25 MCG (1000 UT) CAPS Take 4 capsules by mouth daily. 4000 IU/daily     Eluxadoline (VIBERZI) 75 MG TABS Take 1 tablet (75 mg total) by mouth 2 (two) times daily. 180 tablet 3   EPINEPHrine 0.3 mg/0.3 mL IJ SOAJ injection SMARTSIG:IM As Directed PRN     Evolocumab (REPATHA SURECLICK) XX123456 MG/ML SOAJ Inject 140 mg into the skin every 14 (fourteen) days. 2 mL 11   fexofenadine (ALLEGRA) 180 MG tablet Take 180 mg by mouth daily.     itraconazole (SPORANOX) 10 MG/ML solution Take 10 mLs by mouth daily.     Loperamide HCl (IMODIUM A-D PO) Take by mouth as needed.     Simethicone (GAS-X PO) Take by mouth as needed.     No current facility-administered  medications for this visit.     Assessment & Plan    Mixed hyperlipidemia Patient was given full instructions on injection technique.  Used sample pen multiple times until comfortable with the process.  She was able to give herself first Repatha injection without incident while in the office today.  All questions answered.     Tommy Medal, PharmD CPP [facility] [address] [place]  [place], [place] [address] [phone]  11/04/2022, 7:38 AM

## 2022-11-04 NOTE — Assessment & Plan Note (Signed)
Patient was given full instructions on injection technique.  Used sample pen multiple times until comfortable with the process.  She was able to give herself first Repatha injection without incident while in the office today.  All questions answered.

## 2022-11-08 ENCOUNTER — Ambulatory Visit (INDEPENDENT_AMBULATORY_CARE_PROVIDER_SITE_OTHER): Payer: 59 | Admitting: Adult Health

## 2022-11-08 ENCOUNTER — Encounter (INDEPENDENT_AMBULATORY_CARE_PROVIDER_SITE_OTHER): Payer: Self-pay | Admitting: Adult Health

## 2022-11-08 VITALS — BP 116/81 | HR 83 | Temp 97.7°F | Ht 61.0 in | Wt 144.0 lb

## 2022-11-08 DIAGNOSIS — E559 Vitamin D deficiency, unspecified: Secondary | ICD-10-CM

## 2022-11-08 DIAGNOSIS — E669 Obesity, unspecified: Secondary | ICD-10-CM

## 2022-11-08 DIAGNOSIS — R7303 Prediabetes: Secondary | ICD-10-CM | POA: Diagnosis not present

## 2022-11-08 DIAGNOSIS — Z6827 Body mass index (BMI) 27.0-27.9, adult: Secondary | ICD-10-CM | POA: Diagnosis not present

## 2022-11-08 DIAGNOSIS — Z6832 Body mass index (BMI) 32.0-32.9, adult: Secondary | ICD-10-CM

## 2022-11-08 DIAGNOSIS — E66811 Obesity, class 1: Secondary | ICD-10-CM

## 2022-11-08 NOTE — Progress Notes (Signed)
Chief Complaint:   OBESITY Kim Clements is here to discuss her progress with her obesity treatment plan along with follow-up of her obesity related diagnoses. Kim Clements is on the Category 1 Plan and states she is following her eating plan approximately 80% of the time.  Kim Clements states she is briskly walking 20-30 minutes 4 times per week.  Today's visit was #: 22 Starting weight: 170 lbs Starting date: 07/07/2021 Today's weight: 144 lbs Today's date: 11/08/2022 Total lbs lost to date: 26 lbs Total lbs lost since last in-office visit: + 1 lb  Interim History:  Kim Clements endorses increased diffuse polyphagia since last OV. She reports daily snack calories 200 per day, over the prescribed 100 cal/day.  She increased lunch time protein to 6 oz.  Snacks: 12 scoop chips with cheese Lactose free ice cream Ritz crackers with peanut butter  She started Repatha injections- tolerated well. Will inject second dose this Wednesday 11/10/22   12/02/2021 RMR: 1426- higher than expected  Of note- Husband at [facility] during OV  Subjective:   1. Prediabetes Lab Results  Component Value Date   HGBA1C 5.7 (H) 06/17/2022   HGBA1C 5.3 03/04/2022   HGBA1C 5.7 (H) 10/22/2021   She is not currently on any BG lowering medication. Discussed risk/benefits of Metformin therapy again today- handout provided.  2. Vitamin D deficiency She is on OTC Vit D3 4000 IU QD. She endorses stable energy levels.  Assessment/Plan:   1. Prediabetes Check Labs - Comprehensive metabolic panel - Hemoglobin A1c - Vitamin B12 - Insulin, random  If A1c >5.6 CONSIDER Metformin therapy.  2. Vitamin D deficiency Check Labs - VITAMIN D 25 Hydroxy (Vit-D Deficiency, Fractures)  3. Obesity, current BMI 27.2  Kim Clements is currently in the action stage of change. As such, her goal is to continue with weight loss efforts. She has agreed to the Category 1 Plan.   Exercise goals: For substantial health benefits,  adults should do at least 150 minutes (2 hours and 30 minutes) a week of moderate-intensity, or 75 minutes (1 hour and 15 minutes) a week of vigorous-intensity aerobic physical activity, or an equivalent combination of moderate- and vigorous-intensity aerobic activity. Aerobic activity should be performed in episodes of at least 10 minutes, and preferably, it should be spread throughout the week.  Behavioral modification strategies: increasing lean protein intake, decreasing simple carbohydrates, increasing vegetables, increasing high fiber foods, meal planning and cooking strategies, keeping healthy foods in the home, better snacking choices, and planning for success.  Kim Clements has agreed to follow-up with our clinic in 4 weeks. She was informed of the importance of frequent follow-up visits to maximize her success with intensive lifestyle modifications for her multiple health conditions.   Kim Clements was informed we would discuss her lab results at her next visit unless there is a critical issue that needs to be addressed sooner. Kim Clements agreed to keep her next visit at the agreed upon time to discuss these results.  Chcek IC at next OV- pt aware to arrive 30 mins early and fasting.  Objective:   Blood pressure 116/81, pulse 83, temperature 97.7 F (36.5 C), height '5\' 1"'$  (1.549 m), weight 144 lb (65.3 kg), SpO2 100 %. Body mass index is 27.21 kg/m.  General: Cooperative, alert, well developed, in no acute distress. HEENT: Conjunctivae and lids unremarkable. Cardiovascular: Regular rhythm.  Lungs: Normal work of breathing. Neurologic: No focal deficits.   Lab Results  Component Value Date   CREATININE 0.82 06/17/2022  BUN 20 06/17/2022   NA 141 06/17/2022   K 4.5 06/17/2022   CL 103 06/17/2022   CO2 23 06/17/2022   Lab Results  Component Value Date   ALT 38 (H) 06/17/2022   AST 31 06/17/2022   ALKPHOS 51 06/17/2022   BILITOT 0.5 06/17/2022   Lab Results  Component Value  Date   HGBA1C 5.7 (H) 06/17/2022   HGBA1C 5.3 03/04/2022   HGBA1C 5.7 (H) 10/22/2021   HGBA1C 5.5 07/07/2021   Lab Results  Component Value Date   INSULIN 3.1 06/17/2022   INSULIN 4.0 03/04/2022   INSULIN 9.0 10/22/2021   INSULIN 9.4 07/07/2021   Lab Results  Component Value Date   TSH 2.05 12/25/2020   Lab Results  Component Value Date   CHOL 150 06/17/2022   HDL 57 06/17/2022   LDLCALC 81 06/17/2022   TRIG 59 06/17/2022   CHOLHDL 2.6 06/17/2022   Lab Results  Component Value Date   VD25OH 54.1 06/17/2022   VD25OH 73.6 03/04/2022   VD25OH 61.5 10/22/2021   Lab Results  Component Value Date   WBC 6.2 12/25/2020   HGB 14.3 12/25/2020   HCT 41.9 12/25/2020   MCV 88.3 12/25/2020   PLT 260.0 12/25/2020   No results found for: "IRON", "TIBC", "FERRITIN"  Attestation Statements:   Reviewed by clinician on day of visit: allergies, medications, problem list, medical history, surgical history, family history, social history, and previous encounter notes.  I have reviewed the above documentation for accuracy and completeness, and I agree with the above. -  Valery Amedee d. Joshue Badal, NP-C

## 2022-11-09 LAB — INSULIN, RANDOM: INSULIN: 4.7 u[IU]/mL (ref 2.6–24.9)

## 2022-11-09 LAB — COMPREHENSIVE METABOLIC PANEL
ALT: 17 IU/L (ref 0–32)
AST: 15 IU/L (ref 0–40)
Albumin/Globulin Ratio: 2.3 — ABNORMAL HIGH (ref 1.2–2.2)
Albumin: 4.5 g/dL (ref 3.8–4.9)
Alkaline Phosphatase: 44 IU/L (ref 44–121)
BUN/Creatinine Ratio: 26 — ABNORMAL HIGH (ref 9–23)
BUN: 21 mg/dL (ref 6–24)
Bilirubin Total: 0.5 mg/dL (ref 0.0–1.2)
CO2: 22 mmol/L (ref 20–29)
Calcium: 9.3 mg/dL (ref 8.7–10.2)
Chloride: 105 mmol/L (ref 96–106)
Creatinine, Ser: 0.81 mg/dL (ref 0.57–1.00)
Globulin, Total: 2 g/dL (ref 1.5–4.5)
Glucose: 87 mg/dL (ref 70–99)
Potassium: 4.4 mmol/L (ref 3.5–5.2)
Sodium: 143 mmol/L (ref 134–144)
Total Protein: 6.5 g/dL (ref 6.0–8.5)
eGFR: 85 mL/min/{1.73_m2} (ref >59)

## 2022-11-09 LAB — VITAMIN D 25 HYDROXY (VIT D DEFICIENCY, FRACTURES): Vit D, 25-Hydroxy: 56.1 ng/mL (ref 30.0–100.0)

## 2022-11-09 LAB — VITAMIN B12: Vitamin B-12: 357 pg/mL (ref 232–1245)

## 2022-11-09 LAB — HEMOGLOBIN A1C
Est. average glucose Bld gHb Est-mCnc: 111 mg/dL
Hgb A1c MFr Bld: 5.5 % (ref 4.8–5.6)

## 2022-12-10 ENCOUNTER — Telehealth: Payer: Self-pay | Admitting: Internal Medicine

## 2022-12-10 NOTE — Telephone Encounter (Signed)
Pt c/o medication issue:  1. Name of Medication:  Evolocumab (REPATHA SURECLICK) 140 MG/ML SOAJ  2. How are you currently taking this medication (dosage and times per day)?   3. Are you having a reaction (difficulty breathing--STAT)?   4. What is your medication issue?   Patient would like to discuss taking her next dose of Repatha. If her call is returned next week, she requested that it be after 10:00 AM.

## 2022-12-10 NOTE — Telephone Encounter (Signed)
Patient stated she forgot her dose of repatha on Wednesday. She didn't give herself the injection because she received allergy shots yesterday.  She asked once she gave herself injection today can she go back to her Wednesday's? Or would she have to start giving herself the injections to Fridays.  She wanted to also make sure its ok to do the repatha injections while getting allergy shots?  Please advise.

## 2022-12-10 NOTE — Telephone Encounter (Signed)
If she prefers to inject on Wednesdays, recommend she hold off until Wednesday 4/10 and restart her 2 week injection schedule then. She can take Repatha and allergy shots

## 2022-12-13 ENCOUNTER — Ambulatory Visit (INDEPENDENT_AMBULATORY_CARE_PROVIDER_SITE_OTHER): Payer: 59 | Admitting: Adult Health

## 2022-12-13 ENCOUNTER — Encounter (INDEPENDENT_AMBULATORY_CARE_PROVIDER_SITE_OTHER): Payer: Self-pay | Admitting: Adult Health

## 2022-12-13 VITALS — BP 128/83 | HR 73 | Temp 98.0°F | Ht 61.0 in | Wt 143.0 lb

## 2022-12-13 DIAGNOSIS — R0602 Shortness of breath: Secondary | ICD-10-CM | POA: Diagnosis not present

## 2022-12-13 DIAGNOSIS — E559 Vitamin D deficiency, unspecified: Secondary | ICD-10-CM

## 2022-12-13 DIAGNOSIS — E669 Obesity, unspecified: Secondary | ICD-10-CM

## 2022-12-13 DIAGNOSIS — R7303 Prediabetes: Secondary | ICD-10-CM

## 2022-12-13 DIAGNOSIS — Z6827 Body mass index (BMI) 27.0-27.9, adult: Secondary | ICD-10-CM | POA: Diagnosis not present

## 2022-12-13 NOTE — Telephone Encounter (Signed)
Patient is aware she gave injection on Friday, so she will shift her injection day to Friday.

## 2022-12-13 NOTE — Progress Notes (Signed)
WEIGHT SUMMARY AND BIOMETRICS  Vitals Temp: 98 F (36.7 C) BP: 128/83 Pulse Rate: 73 SpO2: 98 %   Anthropometric Measurements Height: 5\' 1"  (1.549 m) Weight: 143 lb (64.9 kg) BMI (Calculated): 27.03 Weight at Last Visit: 144lb Weight Lost Since Last Visit: 1lb Weight Gained Since Last Visit: 0 Starting Weight: 170lb Total Weight Loss (lbs): 27 lb (12.2 kg)   Body Composition  Body Fat %: 36.9 % Fat Mass (lbs): 52.8 lbs Muscle Mass (lbs): 85.6 lbs Total Body Water (lbs): 62.4 lbs Visceral Fat Rating : 8   Other Clinical Data RMR: 1296 Fasting: Yes Labs: Yes Today's Visit #: 23 Starting Date: 07/07/21    Chief Complaint:   OBESITY Kim Clements is here to discuss her progress with her obesity treatment plan. She is on the the Category 1 Plan and states she is following her eating plan approximately 75 % of the time.  She states she is exercising walking 25 minutes 2 times per week.   Interim History:  She and her family recently traveled to [facility] for a week vacation.  She reports not walking as frequently as she would have liked while the [facility] due to inclimate weather. She was thrilled to have found a lactose free ice cream shop at the beach.  07/07/2021 RMR 1613 12/01/2021 RMR 1426 12/13/2022 RMR 1296 Metabolism has slightly decreased by 130 calories.  Reviewed Bioempedence results with pt: Muscle Mass + 0.6 lbs Adipose Mass  - 1.8 lbs  Subjective:   1. Prediabetes Discussed Labs  Latest Reference Range & Units 11/08/22 10:32  Glucose 70 - 99 mg/dL 87  Hemoglobin B7J 4.8 - 5.6 % 5.5  Est. average glucose Bld gHb Est-mCnc mg/dL 696  INSULIN 2.6 - 78.9 uIU/mL 4.7  She is not currently on any antidiabetic medication. She endorses evening polyphagia.  2. Vitamin D deficiency Discussed Labs  Latest Reference Range & Units 11/08/22 10:32  Vitamin D, 25-Hydroxy 30.0 - 100.0 ng/mL 56.1  She is on daily OTC Vit D3 4,000 IU She endorses  stable energy levels  3. Shortness of Breath on Exertion She endorses dyspnea with extreme exertion, denies CP 07/07/2021 RMR 1613 12/01/2021 RMR 1426 12/13/2022 RMR 1296 Metabolism has slightly decreased by 130 calories.  Assessment/Plan:   1. Prediabetes Continue to reduce sugar/CHO and increase protein intake. Discussed at length higher protein snack options.  2. Vitamin D deficiency Continue OTC Vit D3 supplementation.  3. Shortness of Breath on Exertion Continue Cat 1 Meal Plan  4. Obesity, current BMI 27.2  Kim Clements is currently in the action stage of change. As such, her goal is to continue with weight loss efforts. She has agreed to the Category 1 Plan.   Exercise goals: For substantial health benefits, adults should do at least 150 minutes (2 hours and 30 minutes) a week of moderate-intensity, or 75 minutes (1 hour and 15 minutes) a week of vigorous-intensity aerobic physical activity, or an equivalent combination of moderate- and vigorous-intensity aerobic activity. Aerobic activity should be performed in episodes of at least 10 minutes, and preferably, it should be spread throughout the week.  Behavioral modification strategies: increasing lean protein intake, decreasing simple carbohydrates, increasing vegetables, increasing water intake, no skipping meals, meal planning and cooking strategies, better snacking choices, and planning for success.  Kim Clements has agreed to follow-up with our clinic in 4 weeks. She was informed of the importance of frequent follow-up visits to maximize her success with intensive lifestyle modifications for her  multiple health conditions.   Objective:   Blood pressure 128/83, pulse 73, temperature 98 F (36.7 C), height 5\' 1"  (1.549 m), weight 143 lb (64.9 kg), SpO2 98 %. Body mass index is 27.02 kg/m.  General: Cooperative, alert, well developed, in no acute distress. HEENT: Conjunctivae and lids unremarkable. Cardiovascular: Regular  rhythm.  Lungs: Normal work of breathing. Neurologic: No focal deficits.   Lab Results  Component Value Date   CREATININE 0.81 11/08/2022   BUN 21 11/08/2022   NA 143 11/08/2022   K 4.4 11/08/2022   CL 105 11/08/2022   CO2 22 11/08/2022   Lab Results  Component Value Date   ALT 17 11/08/2022   AST 15 11/08/2022   ALKPHOS 44 11/08/2022   BILITOT 0.5 11/08/2022   Lab Results  Component Value Date   HGBA1C 5.5 11/08/2022   HGBA1C 5.7 (H) 06/17/2022   HGBA1C 5.3 03/04/2022   HGBA1C 5.7 (H) 10/22/2021   HGBA1C 5.5 07/07/2021   Lab Results  Component Value Date   INSULIN 4.7 11/08/2022   INSULIN 3.1 06/17/2022   INSULIN 4.0 03/04/2022   INSULIN 9.0 10/22/2021   INSULIN 9.4 07/07/2021   Lab Results  Component Value Date   TSH 2.05 12/25/2020   Lab Results  Component Value Date   CHOL 150 06/17/2022   HDL 57 06/17/2022   LDLCALC 81 06/17/2022   TRIG 59 06/17/2022   CHOLHDL 2.6 06/17/2022   Lab Results  Component Value Date   VD25OH 56.1 11/08/2022   VD25OH 54.1 06/17/2022   VD25OH 73.6 03/04/2022   Lab Results  Component Value Date   WBC 6.2 12/25/2020   HGB 14.3 12/25/2020   HCT 41.9 12/25/2020   MCV 88.3 12/25/2020   PLT 260.0 12/25/2020   No results found for: "IRON", "TIBC", "FERRITIN"  Attestation Statements:   Reviewed by clinician on day of visit: allergies, medications, problem list, medical history, surgical history, family history, social history, and previous encounter notes.  I have reviewed the above documentation for accuracy and completeness, and I agree with the above. -  Ashby Moskal d. Emmy Keng, NP-C

## 2022-12-28 ENCOUNTER — Other Ambulatory Visit: Payer: Self-pay | Admitting: Physician Assistant

## 2022-12-28 DIAGNOSIS — G8929 Other chronic pain: Secondary | ICD-10-CM

## 2023-01-03 ENCOUNTER — Encounter: Payer: 59 | Admitting: Physician Assistant

## 2023-01-11 ENCOUNTER — Ambulatory Visit (INDEPENDENT_AMBULATORY_CARE_PROVIDER_SITE_OTHER): Payer: 59 | Admitting: Adult Health

## 2023-01-11 ENCOUNTER — Encounter (INDEPENDENT_AMBULATORY_CARE_PROVIDER_SITE_OTHER): Payer: Self-pay | Admitting: Adult Health

## 2023-01-11 VITALS — BP 101/68 | HR 80 | Temp 97.8°F | Ht 61.0 in | Wt 144.0 lb

## 2023-01-11 DIAGNOSIS — Z6827 Body mass index (BMI) 27.0-27.9, adult: Secondary | ICD-10-CM

## 2023-01-11 DIAGNOSIS — E669 Obesity, unspecified: Secondary | ICD-10-CM

## 2023-01-11 DIAGNOSIS — E782 Mixed hyperlipidemia: Secondary | ICD-10-CM

## 2023-01-11 NOTE — Progress Notes (Signed)
WEIGHT SUMMARY AND BIOMETRICS  Vitals Temp: 97.8 F (36.6 C) BP: 101/68 Pulse Rate: 80 SpO2: 98 %   Anthropometric Measurements Height: 5\' 1"  (1.549 m) Weight: 144 lb (65.3 kg) BMI (Calculated): 27.22 Weight at Last Visit: 143lb Weight Lost Since Last Visit: 0 Weight Gained Since Last Visit: 1lb Starting Weight: 170lb Total Weight Loss (lbs): 26 lb (11.8 kg)   Body Composition  Body Fat %: 36.5 % Fat Mass (lbs): 52.8 lbs Muscle Mass (lbs): 87.4 lbs Total Body Water (lbs): 62.6 lbs Visceral Fat Rating : 8   Other Clinical Data Fasting: no Labs: no Today's Visit #: 24 Starting Date: 07/07/21    Chief Complaint:   OBESITY Kim Clements is here to discuss her progress with her obesity treatment plan. She is on the the Category 1 Plan and states she is following her eating plan approximately 80 % of the time.  She states she is not currently exercising.   Interim History:  Due to high pollen count, she has not been walking outside. Kim Clements reports snacking over 100 calorie limit daily. Due to her dairy allergy, she is unable to use certain higher protein snacks.  Reviewed Bioimpedance results with pt: Muscle Mass: + 1.8 lbs Adipose Mass: No change Water Weight: + 0.2 lb  Subjective:   1. Mixed hyperlipidemia Lipid Panel     Component Value Date/Time   CHOL 150 06/17/2022 1032   TRIG 59 06/17/2022 1032   HDL 57 06/17/2022 1032   CHOLHDL 2.6 06/17/2022 1032   CHOLHDL 4 12/25/2020 1130   VLDL 24.2 12/25/2020 1130   LDLCALC 81 06/17/2022 1032   LABVLDL 12 06/17/2022 1032   The 10-year ASCVD risk score (Arnett DK, et al., 2019) is: 1.2%   Values used to calculate the score:     Age: 57 years     Sex: Female     Is Non-Hispanic African American: No     Diabetic: No     Tobacco smoker: No     Systolic Blood Pressure: 101 mmHg     Is BP treated: No     HDL Cholesterol: 57 mg/dL     Total Cholesterol: 156 mg/dL  She is currently on   EPINEPHrine 0.3 mg/0.3 mL IJ SOAJ injection  Bempedoic Acid (NEXLETOL) 180 MG TABS  Evolocumab (REPATHA SURECLICK) 140 MG/ML SOAJ   She is followed by Cards/Dr. Rennis Golden  Assessment/Plan:   1. Mixed hyperlipidemia Continue EPINEPHrine 0.3 mg/0.3 mL IJ SOAJ injection  Bempedoic Acid (NEXLETOL) 180 MG TABS  Evolocumab (REPATHA SURECLICK) 140 MG/ML SOAJ   2. Obesity, current BMI 27.22  Kim Clements is currently in the action stage of change. As such, her goal is to continue with weight loss efforts. She has agreed to the Category 1 Plan.   Exercise goals: Walk Briskly 3 x week.  Resistance Training 2 x week Keep track of calories expended during cardiovascular exercise and add to snack calories- keep snacks UNDER to total amount.  Behavioral modification strategies: increasing lean protein intake, decreasing simple carbohydrates, increasing vegetables, increasing water intake, no skipping meals, meal planning and cooking strategies, keeping healthy foods in the home, and planning for success.  Kim Clements has agreed to follow-up with our clinic in 3 weeks. She was informed of the importance of frequent follow-up visits to maximize her success with intensive lifestyle modifications for her multiple health conditions.   Objective:   Blood pressure 101/68, pulse 80, temperature 97.8 F (36.6 C), height 5\' 1"  (1.549  m), weight 144 lb (65.3 kg), SpO2 98 %. Body mass index is 27.21 kg/m.  General: Cooperative, alert, well developed, in no acute distress. HEENT: Conjunctivae and lids unremarkable. Cardiovascular: Regular rhythm.  Lungs: Normal work of breathing. Neurologic: No focal deficits.   Lab Results  Component Value Date   CREATININE 0.81 11/08/2022   BUN 21 11/08/2022   NA 143 11/08/2022   K 4.4 11/08/2022   CL 105 11/08/2022   CO2 22 11/08/2022   Lab Results  Component Value Date   ALT 17 11/08/2022   AST 15 11/08/2022   ALKPHOS 44 11/08/2022   BILITOT 0.5 11/08/2022    Lab Results  Component Value Date   HGBA1C 5.5 11/08/2022   HGBA1C 5.7 (H) 06/17/2022   HGBA1C 5.3 03/04/2022   HGBA1C 5.7 (H) 10/22/2021   HGBA1C 5.5 07/07/2021   Lab Results  Component Value Date   INSULIN 4.7 11/08/2022   INSULIN 3.1 06/17/2022   INSULIN 4.0 03/04/2022   INSULIN 9.0 10/22/2021   INSULIN 9.4 07/07/2021   Lab Results  Component Value Date   TSH 2.05 12/25/2020   Lab Results  Component Value Date   CHOL 150 06/17/2022   HDL 57 06/17/2022   LDLCALC 81 06/17/2022   TRIG 59 06/17/2022   CHOLHDL 2.6 06/17/2022   Lab Results  Component Value Date   VD25OH 56.1 11/08/2022   VD25OH 54.1 06/17/2022   VD25OH 73.6 03/04/2022   Lab Results  Component Value Date   WBC 6.2 12/25/2020   HGB 14.3 12/25/2020   HCT 41.9 12/25/2020   MCV 88.3 12/25/2020   PLT 260.0 12/25/2020   No results found for: "IRON", "TIBC", "FERRITIN"  Attestation Statements:   Reviewed by clinician on day of visit: allergies, medications, problem list, medical history, surgical history, family history, social history, and previous encounter notes.  Time spent on visit including pre-visit chart review and post-visit care and charting was 35 minutes.   I have reviewed the above documentation for accuracy and completeness, and I agree with the above. -  Mady Oubre d. Loistine Eberlin, NP-C

## 2023-01-19 LAB — NMR, LIPOPROFILE
Cholesterol, Total: 100 mg/dL (ref 100–199)
HDL Particle Number: 44.2 umol/L (ref >=30.5)
HDL-C: 55 mg/dL (ref >39)
LDL Particle Number: <300 nmol/L (ref <1000)
LDL-C (NIH Calc): 29 mg/dL (ref 0–99)
LP-IR Score: 26 (ref <=45)
Small LDL Particle Number: <90 nmol/L (ref <=527)
Triglycerides: 77 mg/dL (ref 0–149)

## 2023-01-19 LAB — LIPOPROTEIN A (LPA): Lipoprotein (a): 51.1 nmol/L (ref <75.0)

## 2023-01-21 ENCOUNTER — Encounter: Payer: Self-pay | Admitting: Physician Assistant

## 2023-01-21 ENCOUNTER — Ambulatory Visit (INDEPENDENT_AMBULATORY_CARE_PROVIDER_SITE_OTHER): Payer: 59 | Admitting: Physician Assistant

## 2023-01-21 VITALS — BP 108/72 | HR 85 | Temp 97.8°F | Ht 61.0 in | Wt 148.8 lb

## 2023-01-21 DIAGNOSIS — J301 Allergic rhinitis due to pollen: Secondary | ICD-10-CM | POA: Diagnosis not present

## 2023-01-21 DIAGNOSIS — Z0000 Encounter for general adult medical examination without abnormal findings: Secondary | ICD-10-CM

## 2023-01-21 MED ORDER — AZELASTINE-FLUTICASONE 137-50 MCG/ACT NA SUSP
1.0000 | Freq: Two times a day (BID) | NASAL | 2 refills | Status: DC
Start: 2023-01-21 — End: 2024-01-11

## 2023-01-21 NOTE — Progress Notes (Signed)
Subjective:    Patient ID: Kim Clements, female    DOB: 08-14-1966, 57 y.o.   MRN: [id number]  Chief Complaint  Patient presents with   Annual Exam    Pt in office for annual CPE and fasting; pt on the fence about shingles vaccine; allergies have been more severe not seeing allergy doctor until October; no other issues to discuss; pt had recent labs;     HPI Patient is in today for annual exam.  Health maintenance: Lifestyle/ exercise: Walking usually  Nutrition: Doing well, cooks most meals  Mental health: Doing well Sleep: Wakes up in the middle of night occasionally, started since COVID-19 in 2022 Immunizations: Contemplating Shingrix  Colonoscopy: Due in 2027 Mammogram: UTD DEXA: 2018 - normal     Past Medical History:  Diagnosis Date   Anal fissure    Anxiety    Arthritis    Asthma    Back pain    Colon polyps    Diverticulosis    Edema, lower extremity    GERD (gastroesophageal reflux disease)    HLD (hyperlipidemia)    IBS (irritable bowel syndrome)    Joint pain    Lactose intolerance    Osteoarthritis    Vitamin D deficiency     Past Surgical History:  Procedure Laterality Date   ABDOMINAL HYSTERECTOMY  2007   LAPAROSCOPY  1988   Laser. Indication: Endometriosis.   TONSILLECTOMY  1971    Family History  Problem Relation Age of Onset   Colon polyps Mother    Heart disease Mother    Rheum arthritis Mother    Osteoarthritis Mother    High blood pressure Mother    Anxiety disorder Mother    Colon polyps Father    Stroke Father    High blood pressure Father    High Cholesterol Father    Heart disease Father    Heart disease Maternal Grandmother    Heart disease Maternal Grandfather    Breast cancer Paternal Grandmother        all of paternal females   Prostate cancer Paternal Grandfather    Heart disease Paternal Grandfather    Breast cancer Paternal Aunt    Colon cancer Neg Hx     Social History   Tobacco Use   Smoking  status: Never   Smokeless tobacco: Never  Vaping Use   Vaping Use: Never used  Substance Use Topics   Alcohol use: No    Alcohol/week: 0.0 standard drinks of alcohol   Drug use: No     Allergies  Allergen Reactions   Erythromycin Rash   Penicillins Hives   Sulfa Antibiotics Rash    Review of Systems NEGATIVE UNLESS OTHERWISE INDICATED IN HPI      Objective:     BP 108/72 (BP Location: Right Arm)   Pulse 85   Temp 97.8 F (36.6 C) (Temporal)   Ht 5\' 1"  (1.549 m)   Wt 148 lb 12.8 oz (67.5 kg)   SpO2 100%   BMI 28.12 kg/m   Wt Readings from Last 3 Encounters:  01/21/23 148 lb 12.8 oz (67.5 kg)  01/11/23 144 lb (65.3 kg)  12/13/22 143 lb (64.9 kg)    BP Readings from Last 3 Encounters:  01/21/23 108/72  01/11/23 101/68  12/13/22 128/83     Physical Exam Vitals and nursing note reviewed.  Constitutional:      Appearance: Normal appearance. She is normal weight. She is not toxic-appearing.  HENT:  Head: Normocephalic and atraumatic.     Right Ear: Tympanic membrane, ear canal and external ear normal.     Left Ear: Tympanic membrane, ear canal and external ear normal.     Nose: Nose normal.     Mouth/Throat:     Mouth: Mucous membranes are moist.  Eyes:     Extraocular Movements: Extraocular movements intact.     Conjunctiva/sclera: Conjunctivae normal.     Pupils: Pupils are equal, round, and reactive to light.  Cardiovascular:     Rate and Rhythm: Normal rate and regular rhythm.     Pulses: Normal pulses.     Heart sounds: Normal heart sounds.  Pulmonary:     Effort: Pulmonary effort is normal.     Breath sounds: Normal breath sounds.  Abdominal:     General: Abdomen is flat. Bowel sounds are normal.     Palpations: Abdomen is soft.  Musculoskeletal:        General: Normal range of motion.     Cervical back: Normal range of motion and neck supple.     Right lower leg: No edema.     Left lower leg: No edema.  Skin:    General: Skin is warm  and dry.     Comments: Several seborrheic keratoses noted  Neurological:     General: No focal deficit present.     Mental Status: She is alert and oriented to person, place, and time.  Psychiatric:        Mood and Affect: Mood normal.        Behavior: Behavior normal.        Thought Content: Thought content normal.        Judgment: Judgment normal.        Assessment & Plan:  Encounter for annual physical exam  Non-seasonal allergic rhinitis due to pollen -     Azelastine-Fluticasone; Place 1 spray into the nose every 12 (twelve) hours.  Dispense: 23 g; Refill: 2   Age-appropriate screening and counseling performed today. Will check labs and call with results. Preventive measures discussed and printed in AVS for patient.   Patient Counseling: [x]   Nutrition: Stressed importance of moderation in sodium/caffeine intake, saturated fat and cholesterol, caloric balance, sufficient intake of fresh fruits, vegetables, and fiber.  [x]   Stressed the importance of regular exercise.   []   Substance Abuse: Discussed cessation/primary prevention of tobacco, alcohol, or other drug use; driving or other dangerous activities under the influence; availability of treatment for abuse.   []   Injury prevention: Discussed safety belts, safety helmets, smoke detector, smoking near bedding or upholstery.   []   Sexuality: Discussed sexually transmitted diseases, partner selection, use of condoms, avoidance of unintended pregnancy  and contraceptive alternatives.   [x]   Dental health: Discussed importance of regular tooth brushing, flossing, and dental visits.  [x]   Health maintenance and immunizations reviewed. Please refer to Health maintenance section.        Return in about 1 year (around 01/21/2024) for physical.    Kattleya Kuhnert M Keddrick Wyne, PA-C

## 2023-01-25 ENCOUNTER — Ambulatory Visit: Payer: 59 | Attending: Internal Medicine | Admitting: Internal Medicine

## 2023-01-25 ENCOUNTER — Encounter: Payer: Self-pay | Admitting: Internal Medicine

## 2023-01-25 VITALS — BP 112/68 | HR 87 | Ht 61.5 in | Wt 149.8 lb

## 2023-01-25 DIAGNOSIS — T466X5A Adverse effect of antihyperlipidemic and antiarteriosclerotic drugs, initial encounter: Secondary | ICD-10-CM

## 2023-01-25 DIAGNOSIS — E782 Mixed hyperlipidemia: Secondary | ICD-10-CM

## 2023-01-25 DIAGNOSIS — I700 Atherosclerosis of aorta: Secondary | ICD-10-CM | POA: Diagnosis not present

## 2023-01-25 DIAGNOSIS — T466X5D Adverse effect of antihyperlipidemic and antiarteriosclerotic drugs, subsequent encounter: Secondary | ICD-10-CM

## 2023-01-25 DIAGNOSIS — E785 Hyperlipidemia, unspecified: Secondary | ICD-10-CM

## 2023-01-25 DIAGNOSIS — E7841 Elevated Lipoprotein(a): Secondary | ICD-10-CM

## 2023-01-25 DIAGNOSIS — M791 Myalgia, unspecified site: Secondary | ICD-10-CM | POA: Diagnosis not present

## 2023-01-25 NOTE — Patient Instructions (Signed)
Medication Instructions:  NO CHANGES  *If you need a refill on your cardiac medications before your next appointment, please call your pharmacy*   Follow-Up: At [facility], you and your health needs are our priority.  As part of our continuing mission to provide you with exceptional heart care, we have created designated Provider Care Teams.  These Care Teams include your primary Cardiologist (physician) and Advanced Practice Providers (APPs -  Physician Assistants and Nurse Practitioners) who all work together to provide you with the care you need, when you need it.  We recommend signing up for the patient portal called "MyChart".  Sign up information is provided on this After Visit Summary.  MyChart is used to connect with patients for Virtual Visits (Telemedicine).  Patients are able to view lab/test results, encounter notes, upcoming appointments, etc.  Non-urgent messages can be sent to your provider as well.   To learn more about what you can do with MyChart, go to [email or web address].    Your next appointment:    12 months with Dr. Hilty  

## 2023-01-25 NOTE — Progress Notes (Signed)
LIPID CLINIC CONSULT NOTE  Chief Complaint:  Follow-up dyslipidemia  Primary Care Physician: Allwardt, Crist Infante, PA-C  Primary Cardiologist:  None  HPI:  Kim Clements is a 57 y.o. female who is being seen today for the evaluation of dyslipidemia at the request of Allwardt, Alyssa M, PA-C.  This is a pleasant 57 year old female kindly referred for evaluation management of dyslipidemia.  She reports a family history of heart disease including her mother who had an MI at age 41 and both parents who have high cholesterol.  Her father died of a stroke suddenly at age 8.  Her recent cholesterol profile showed total of 211, HDL 55, triglycerides 77 and LDL 142.  Unfortunately, she could not tolerate statins causing significant myalgias.  She did have some abdominal imaging which showed aortic atherosclerosis.  She suffers from IBS and is on Viberzi.  She has no other traditional cardiovascular risk factors other than family history of early onset heart disease.  06/25/2022  Kim Clements returns today for follow-up.  She has had a significant reduction in her cholesterol and Nexletol.  Her LDL is gone down from 142 to 85 with LDL particle number of 952.  She did have some coronary calcium with a score of 17 however this was significantly age advanced and represents 83rd percentile for age and sex matched controls suggestive of early onset heart disease.  This is possibly explained by an elevated LP(a) which was noted at 85.8 nmol/L.  Based on this I would target her LDL to less than 70 which she is still above.  We discussed options for management and the best would be to consider PCSK9 inhibitor which would help her to reach a target in addition to her therapy since she cannot tolerate statins and potentially give her some benefit in her LP(a) elevation.  01/25/2023  Kim Clements is seen today in follow-up.  She has been tolerating Repatha well without any significant side effects.   She is also Nexletol.  Her LP(a) has come down from 85.8 to 51.1 nmol/L.  Her LDL particle number is now less than 300 with an LDL-C of 29, HDL-C of 55, triglycerides were 77 and total cholesterol 100.  Overall a significant improvement in her lipids.  PMHx:  Past Medical History:  Diagnosis Date   Anal fissure    Anxiety    Arthritis    Asthma    Back pain    Colon polyps    Diverticulosis    Edema, lower extremity    GERD (gastroesophageal reflux disease)    HLD (hyperlipidemia)    IBS (irritable bowel syndrome)    Joint pain    Lactose intolerance    Osteoarthritis    Vitamin D deficiency     Past Surgical History:  Procedure Laterality Date   ABDOMINAL HYSTERECTOMY  2007   LAPAROSCOPY  1988   Laser. Indication: Endometriosis.   TONSILLECTOMY  1971    FAMHx:  Family History  Problem Relation Age of Onset   Colon polyps Mother    Heart disease Mother    Rheum arthritis Mother    Osteoarthritis Mother    High blood pressure Mother    Anxiety disorder Mother    Colon polyps Father    Stroke Father    High blood pressure Father    High Cholesterol Father    Heart disease Father    Heart disease Maternal Grandmother    Heart disease Maternal Grandfather  Breast cancer Paternal Grandmother        all of paternal females   Prostate cancer Paternal Grandfather    Heart disease Paternal Grandfather    Breast cancer Paternal Aunt    Colon cancer Neg Hx     SOCHx:   reports that she has never smoked. She has never used smokeless tobacco. She reports that she does not drink alcohol and does not use drugs.  ALLERGIES:  Allergies  Allergen Reactions   Erythromycin Rash   Penicillins Hives   Sulfa Antibiotics Rash    ROS: Pertinent items noted in HPI and remainder of comprehensive ROS otherwise negative.  HOME MEDS: Current Outpatient Medications on File Prior to Visit  Medication Sig Dispense Refill   AMBULATORY NON FORMULARY MEDICATION Allergy Shots 2  shots per week     amitriptyline (ELAVIL) 10 MG tablet TAKE 1 TABLET BY MOUTH EVERY DAY AT NIGHT 90 tablet 0   Azelastine-Fluticasone 137-50 MCG/ACT SUSP Place 1 spray into the Clements every 12 (twelve) hours. 23 g 2   Bempedoic Acid (NEXLETOL) 180 MG TABS Take 1 tablet by mouth daily. 90 tablet 3   Cholecalciferol (VITAMIN D3) 25 MCG (1000 UT) CAPS Take 4 capsules by mouth daily. 4000 IU/daily     Eluxadoline (VIBERZI) 75 MG TABS Take 1 tablet (75 mg total) by mouth 2 (two) times daily. 180 tablet 3   EPINEPHrine 0.3 mg/0.3 mL IJ SOAJ injection SMARTSIG:IM As Directed PRN     Evolocumab (REPATHA SURECLICK) 140 MG/ML SOAJ Inject 140 mg into the skin every 14 (fourteen) days. 2 mL 11   fexofenadine (ALLEGRA) 180 MG tablet Take 180 mg by mouth daily.     itraconazole (SPORANOX) 10 MG/ML solution Take 10 mLs by mouth daily. Mediation is not oral, it is a topical application.     No current facility-administered medications on file prior to visit.    LABS/IMAGING: No results found for this or any previous visit (from the past 48 hour(s)).  No results found.  LIPID PANEL:    Component Value Date/Time   CHOL 150 06/17/2022 1032   TRIG 59 06/17/2022 1032   HDL 57 06/17/2022 1032   CHOLHDL 2.6 06/17/2022 1032   CHOLHDL 4 12/25/2020 1130   VLDL 24.2 12/25/2020 1130   LDLCALC 81 06/17/2022 1032    WEIGHTS: Wt Readings from Last 3 Encounters:  01/25/23 149 lb 12.8 oz (67.9 kg)  01/21/23 148 lb 12.8 oz (67.5 kg)  01/11/23 144 lb (65.3 kg)    VITALS: BP 112/68   Pulse 87   Ht 5' 1.5" (1.562 m)   Wt 149 lb 12.8 oz (67.9 kg)   SpO2 99%   BMI 27.85 kg/m   EXAM: Deferred  EKG: N/A  ASSESSMENT: Mixed dyslipidemia Family history of premature coronary artery disease Statin myalgia Aortic atherosclerosis Elevated LP(a)-85.8 nmol/L CAC score of 17, 83rd percentile for age and sex matched controls  PLAN: 1.   Kim Clements has had a significant improvement in her lipids and  normalization of her LP(a) on Repatha.  She seems to be tolerating it very well.  Will continue with this current therapy.  If her LDL gets to an undetectable level then we could consider stopping the Nexletol but I would continue it in addition with the Repatha for the time being.  Plan follow-up with me annually or sooner as necessary.  It would be okay for PCP or weight management physician wanted to repeat her lipids in 6 months.  Kim Nose, MD, [facility], FACP  [place]  [facility]  Medical Director of the Advanced Lipid Disorders &  Cardiovascular Risk Reduction Clinic Diplomate of the American Board of Clinical Lipidology Attending Cardiologist  Direct Dial: [phone]  Fax: [phone]  Website:  [email or web address] Nicely Bani Gianfrancesco 01/25/2023, 2:45 PM

## 2023-02-01 ENCOUNTER — Ambulatory Visit (INDEPENDENT_AMBULATORY_CARE_PROVIDER_SITE_OTHER): Payer: 59 | Admitting: Adult Health

## 2023-02-01 ENCOUNTER — Encounter (INDEPENDENT_AMBULATORY_CARE_PROVIDER_SITE_OTHER): Payer: Self-pay | Admitting: Adult Health

## 2023-02-01 VITALS — BP 101/69 | HR 79 | Temp 97.9°F | Ht 61.0 in | Wt 145.0 lb

## 2023-02-01 DIAGNOSIS — R7303 Prediabetes: Secondary | ICD-10-CM | POA: Diagnosis not present

## 2023-02-01 DIAGNOSIS — Z6827 Body mass index (BMI) 27.0-27.9, adult: Secondary | ICD-10-CM

## 2023-02-01 DIAGNOSIS — E669 Obesity, unspecified: Secondary | ICD-10-CM

## 2023-02-01 DIAGNOSIS — E782 Mixed hyperlipidemia: Secondary | ICD-10-CM | POA: Diagnosis not present

## 2023-02-01 NOTE — Progress Notes (Signed)
WEIGHT SUMMARY AND BIOMETRICS  Vitals Temp: 97.9 F (36.6 C) BP: 101/69 Pulse Rate: 79 SpO2: 99 %   Anthropometric Measurements Height: 5\' 1"  (1.549 m) Weight: 145 lb (65.8 kg) BMI (Calculated): 27.41 Weight at Last Visit: 144lb Weight Lost Since Last Visit: 0 Weight Gained Since Last Visit: 1lb Starting Weight: 170lb Total Weight Loss (lbs): 25 lb (11.3 kg)   Body Composition  Body Fat %: 37.4 % Fat Mass (lbs): 54.4 lbs Muscle Mass (lbs): 86.4 lbs Total Body Water (lbs): 63.2 lbs Visceral Fat Rating : 9   Other Clinical Data Fasting: no Labs: no Today's Visit #: 25 Starting Date: 07/07/21    Chief Complaint:   OBESITY Kim Clements is here to discuss her progress with her obesity treatment plan. She is on the the Category 1 Plan and states she is following her eating plan approximately 80 % of the time. She states she is exercising Walking 15-20 minutes 3 times per week.   Interim History:  She was seen by Dr. Angela Burke on 01/25/2023 Labs stable Lipid panel much improved- no change to current regime: Nexletol 180mg  QD and Repatha 140mg  Q14 day injection.  Hunger/appetite-she is challenged to keep snack calories 100/day. She will often experience late after polyphagia.  Exercise-walking 15-20 ins at least 3 x week. Hydration-she will drink plain water, unsweetened tea, 1/2 and 1/2 sweet/unsweet tea, and sweetened tea.  Reviewed Bioempedence Results with pt: Muscle Mass: - 1 lb Adipose Mass: + 1.6 lbs  Subjective:   1. Mixed hyperlipidemia Lipid Panel     Component Value Date/Time   CHOL 150 06/17/2022 1032   TRIG 59 06/17/2022 1032   HDL 57 06/17/2022 1032   CHOLHDL 2.6 06/17/2022 1032   CHOLHDL 4 12/25/2020 1130   VLDL 24.2 12/25/2020 1130   LDLCALC 81 06/17/2022 1032   LABVLDL 12 06/17/2022 1032    1/61/0960 Dr. Hilty/Cards OV Notes: Kim Clements is seen today in follow-up.  She has been tolerating Repatha well without any significant  side effects.  She is also Nexletol.  Her LP(a) has come down from 85.8 to 51.1 nmol/L.  Her LDL particle number is now less than 300 with an LDL-C of 29, HDL-C of 55, triglycerides were 77 and total cholesterol 100.  Overall a significant improvement in her lipids.   F/u in one year with Cards.  2. Prediabetes Lab Results  Component Value Date   HGBA1C 5.5 11/08/2022   HGBA1C 5.7 (H) 06/17/2022   HGBA1C 5.3 03/04/2022   Hunger/appetite-she is challenged to keep snack calories 100/day. She will often experience late after polyphagia.  Assessment/Plan:   1. Mixed hyperlipidemia Continue  EPINEPHrine 0.3 mg/0.3 mL IJ SOAJ injection  Bempedoic Acid (NEXLETOL) 180 MG TABS  Evolocumab (REPATHA SURECLICK) 140 MG/ML SOAJ    2. Prediabetes Increase protein at meals and with snacks. If snack if >100 cal, then try to expend the extra calories via exercise.  3. Obesity, current BMI 27.41  Kim Clements is currently in the action stage of change. As such, her goal is to continue with weight loss efforts. She has agreed to the Category 1 Plan.   Exercise goals: For substantial health benefits, adults should do at least 150 minutes (2 hours and 30 minutes) a week of moderate-intensity, or 75 minutes (1 hour and 15 minutes) a week of vigorous-intensity aerobic physical activity, or an equivalent combination of moderate- and vigorous-intensity aerobic activity. Aerobic activity should be performed in episodes of at least 10 minutes,  and preferably, it should be spread throughout the week.  Behavioral modification strategies: increasing lean protein intake, decreasing simple carbohydrates, increasing vegetables, increasing water intake, decreasing liquid calories, no skipping meals, meal planning and cooking strategies, better snacking choices, and planning for success.  Kim Clements has agreed to follow-up with our clinic in 4 weeks. She was informed of the importance of frequent follow-up visits to  maximize her success with intensive lifestyle modifications for her multiple health conditions.   Check Fasting Labs at July 2024 OV  Objective:   Blood pressure 101/69, pulse 79, temperature 97.9 F (36.6 C), height 5\' 1"  (1.549 m), weight 145 lb (65.8 kg), SpO2 99 %. Body mass index is 27.4 kg/m.  General: Cooperative, alert, well developed, in no acute distress. HEENT: Conjunctivae and lids unremarkable. Cardiovascular: Regular rhythm.  Lungs: Normal work of breathing. Neurologic: No focal deficits.   Lab Results  Component Value Date   CREATININE 0.81 11/08/2022   BUN 21 11/08/2022   NA 143 11/08/2022   K 4.4 11/08/2022   CL 105 11/08/2022   CO2 22 11/08/2022   Lab Results  Component Value Date   ALT 17 11/08/2022   AST 15 11/08/2022   ALKPHOS 44 11/08/2022   BILITOT 0.5 11/08/2022   Lab Results  Component Value Date   HGBA1C 5.5 11/08/2022   HGBA1C 5.7 (H) 06/17/2022   HGBA1C 5.3 03/04/2022   HGBA1C 5.7 (H) 10/22/2021   HGBA1C 5.5 07/07/2021   Lab Results  Component Value Date   INSULIN 4.7 11/08/2022   INSULIN 3.1 06/17/2022   INSULIN 4.0 03/04/2022   INSULIN 9.0 10/22/2021   INSULIN 9.4 07/07/2021   Lab Results  Component Value Date   TSH 2.05 12/25/2020   Lab Results  Component Value Date   CHOL 150 06/17/2022   HDL 57 06/17/2022   LDLCALC 81 06/17/2022   TRIG 59 06/17/2022   CHOLHDL 2.6 06/17/2022   Lab Results  Component Value Date   VD25OH 56.1 11/08/2022   VD25OH 54.1 06/17/2022   VD25OH 73.6 03/04/2022   Lab Results  Component Value Date   WBC 6.2 12/25/2020   HGB 14.3 12/25/2020   HCT 41.9 12/25/2020   MCV 88.3 12/25/2020   PLT 260.0 12/25/2020   No results found for: "IRON", "TIBC", "FERRITIN"   Attestation Statements:   Reviewed by clinician on day of visit: allergies, medications, problem list, medical history, surgical history, family history, social history, and previous encounter notes.  Time spent on visit  including pre-visit chart review and post-visit care and charting was 40 minutes.   Mr. And Mrs. Kor are seen together and this requires additional time to field/answer all questions/concerns.  I have reviewed the above documentation for accuracy and completeness, and I agree with the above. -  Christl Fessenden d. Jaycub Noorani, NP-C

## 2023-02-22 ENCOUNTER — Encounter (INDEPENDENT_AMBULATORY_CARE_PROVIDER_SITE_OTHER): Payer: Self-pay | Admitting: Adult Health

## 2023-02-22 ENCOUNTER — Ambulatory Visit (INDEPENDENT_AMBULATORY_CARE_PROVIDER_SITE_OTHER): Payer: 59 | Admitting: Adult Health

## 2023-02-22 VITALS — BP 95/65 | HR 74 | Temp 98.3°F | Ht 61.0 in | Wt 144.0 lb

## 2023-02-22 DIAGNOSIS — E559 Vitamin D deficiency, unspecified: Secondary | ICD-10-CM | POA: Diagnosis not present

## 2023-02-22 DIAGNOSIS — Z6827 Body mass index (BMI) 27.0-27.9, adult: Secondary | ICD-10-CM

## 2023-02-22 DIAGNOSIS — Z0000 Encounter for general adult medical examination without abnormal findings: Secondary | ICD-10-CM

## 2023-02-22 DIAGNOSIS — E669 Obesity, unspecified: Secondary | ICD-10-CM | POA: Diagnosis not present

## 2023-02-22 DIAGNOSIS — R7303 Prediabetes: Secondary | ICD-10-CM | POA: Diagnosis not present

## 2023-02-22 NOTE — Progress Notes (Signed)
WEIGHT SUMMARY AND BIOMETRICS  Vitals Temp: 98.3 F (36.8 C) BP: 95/65 Pulse Rate: 74 SpO2: 98 %   Anthropometric Measurements Height: 5\' 1"  (1.549 m) Weight: 144 lb (65.3 kg) BMI (Calculated): 27.22 Weight at Last Visit: 145lb Weight Lost Since Last Visit: 1lb Weight Gained Since Last Visit: 0 Starting Weight: 170lb Total Weight Loss (lbs): 26 lb (11.8 kg)   Body Composition  Body Fat %: 36.7 % Fat Mass (lbs): 53 lbs Muscle Mass (lbs): 86.8 lbs Total Body Water (lbs): 62.6 lbs Visceral Fat Rating : 8   Other Clinical Data Fasting: No Labs: No Today's Visit #: 26 Starting Date: 07/07/21    Chief Complaint:   OBESITY Margrete is here to discuss her progress with her obesity treatment plan. She is on the the Category 1 Plan and states she is following her eating plan approximately 80 % of the time. She states she is exercising Walking 25 minutes 3-4 times per week.   Interim History:  The Tse family celebrated their daughter "Zollie Scale" 20st birthday for a week! She turned 20 on 02/16/2023 They enjoyed meals at [employer], [employer], [employer], and had a [employer]  She and her immediate family will travel to [facility] for 7 days next week  Exercise-she has increased walking- at least 3-4 times per week  Hydration-she estimates to drink 80-90 oz water/day  Reviewed Bioempedence results with pt: Muscle Mass: +0.4 lb Adipose Mass: -1.4 lbs  Of Note- Mr. Gelardi (pt's husbands at [facility] during OV).  Subjective:   1. Vitamin D deficiency  Latest Reference Range & Units 10/22/21 10:27 03/04/22 15:30 06/17/22 10:32 11/08/22 10:32  Vitamin D, 25-Hydroxy 30.0 - 100.0 ng/mL 61.5 73.6 54.1 56.1   She is currently on daily OTC Vit D3 1,000 international unit: 4 caps/day= 4,000 international unit/day  2. Healthcare maintenance  12/13/22 10:00  RMR 1296    3. Prediabetes Lab Results  Component Value Date   HGBA1C 5.5  11/08/2022   HGBA1C 5.7 (H) 06/17/2022   HGBA1C 5.3 03/04/2022   She is not currently on any BG lowering medications   Assessment/Plan:   1. Vitamin D deficiency Check Labs at next OV  2. Healthcare maintenance Check Labs at next OV  3. Prediabetes Check Labs at next OV  4. Obesity, current BMI 27.22  Chayanne is currently in the action stage of change. As such, her goal is to continue with weight loss efforts. She has agreed to the Category 1 Plan.   Exercise goals: For substantial health benefits, adults should do at least 150 minutes (2 hours and 30 minutes) a week of moderate-intensity, or 75 minutes (1 hour and 15 minutes) a week of vigorous-intensity aerobic physical activity, or an equivalent combination of moderate- and vigorous-intensity aerobic activity. Aerobic activity should be performed in episodes of at least 10 minutes, and preferably, it should be spread throughout the week.  Behavioral modification strategies: increasing lean protein intake, decreasing simple carbohydrates, increasing vegetables, increasing water intake, decreasing eating out, no skipping meals, meal planning and cooking strategies, keeping healthy foods in the home, better snacking choices, keeping a strict food journal, and decreasing junk food.  Janaisa has agreed to follow-up with our clinic in 4 weeks. She was informed of the importance of frequent follow-up visits to maximize her success with intensive lifestyle modifications for her multiple health conditions.   Check Fasting Labs at next OV  Objective:   Blood pressure 95/65, pulse 74,  temperature 98.3 F (36.8 C), height 5\' 1"  (1.549 m), weight 144 lb (65.3 kg), SpO2 98 %. Body mass index is 27.21 kg/m.  General: Cooperative, alert, well developed, in no acute distress. HEENT: Conjunctivae and lids unremarkable. Cardiovascular: Regular rhythm.  Lungs: Normal work of breathing. Neurologic: No focal deficits.   Lab Results   Component Value Date   CREATININE 0.81 11/08/2022   BUN 21 11/08/2022   NA 143 11/08/2022   K 4.4 11/08/2022   CL 105 11/08/2022   CO2 22 11/08/2022   Lab Results  Component Value Date   ALT 17 11/08/2022   AST 15 11/08/2022   ALKPHOS 44 11/08/2022   BILITOT 0.5 11/08/2022   Lab Results  Component Value Date   HGBA1C 5.5 11/08/2022   HGBA1C 5.7 (H) 06/17/2022   HGBA1C 5.3 03/04/2022   HGBA1C 5.7 (H) 10/22/2021   HGBA1C 5.5 07/07/2021   Lab Results  Component Value Date   INSULIN 4.7 11/08/2022   INSULIN 3.1 06/17/2022   INSULIN 4.0 03/04/2022   INSULIN 9.0 10/22/2021   INSULIN 9.4 07/07/2021   Lab Results  Component Value Date   TSH 2.05 12/25/2020   Lab Results  Component Value Date   CHOL 150 06/17/2022   HDL 57 06/17/2022   LDLCALC 81 06/17/2022   TRIG 59 06/17/2022   CHOLHDL 2.6 06/17/2022   Lab Results  Component Value Date   VD25OH 56.1 11/08/2022   VD25OH 54.1 06/17/2022   VD25OH 73.6 03/04/2022   Lab Results  Component Value Date   WBC 6.2 12/25/2020   HGB 14.3 12/25/2020   HCT 41.9 12/25/2020   MCV 88.3 12/25/2020   PLT 260.0 12/25/2020   No results found for: "IRON", "TIBC", "FERRITIN"  Attestation Statements:   Reviewed by clinician on day of visit: allergies, medications, problem list, medical history, surgical history, family history, social history, and previous encounter notes.  Time spent on visit including pre-visit chart review and post-visit care and charting was 38 minutes.   I have reviewed the above documentation for accuracy and completeness, and I agree with the above. -  Brinna Divelbiss d. Tyhir Schwan, NP-C

## 2023-03-16 ENCOUNTER — Ambulatory Visit (INDEPENDENT_AMBULATORY_CARE_PROVIDER_SITE_OTHER): Payer: 59 | Admitting: Adult Health

## 2023-03-17 ENCOUNTER — Telehealth: Payer: Self-pay | Admitting: Pharmacist

## 2023-03-17 NOTE — Telephone Encounter (Signed)
Repatha replacement form faxed to [facility]

## 2023-03-24 ENCOUNTER — Ambulatory Visit (INDEPENDENT_AMBULATORY_CARE_PROVIDER_SITE_OTHER): Payer: 59 | Admitting: Adult Health

## 2023-03-27 ENCOUNTER — Other Ambulatory Visit: Payer: Self-pay | Admitting: Internal Medicine

## 2023-04-05 ENCOUNTER — Encounter (INDEPENDENT_AMBULATORY_CARE_PROVIDER_SITE_OTHER): Payer: Self-pay | Admitting: Adult Health

## 2023-04-05 ENCOUNTER — Ambulatory Visit (INDEPENDENT_AMBULATORY_CARE_PROVIDER_SITE_OTHER): Payer: 59 | Admitting: Adult Health

## 2023-04-05 VITALS — BP 107/74 | HR 75 | Temp 97.8°F | Ht 61.0 in | Wt 145.0 lb

## 2023-04-05 DIAGNOSIS — E559 Vitamin D deficiency, unspecified: Secondary | ICD-10-CM

## 2023-04-05 DIAGNOSIS — E669 Obesity, unspecified: Secondary | ICD-10-CM

## 2023-04-05 DIAGNOSIS — R7303 Prediabetes: Secondary | ICD-10-CM

## 2023-04-05 DIAGNOSIS — Z6827 Body mass index (BMI) 27.0-27.9, adult: Secondary | ICD-10-CM | POA: Diagnosis not present

## 2023-04-05 DIAGNOSIS — Z0000 Encounter for general adult medical examination without abnormal findings: Secondary | ICD-10-CM

## 2023-04-05 NOTE — Progress Notes (Signed)
WEIGHT SUMMARY AND BIOMETRICS  Vitals Temp: 97.8 F (36.6 C) BP: 107/74 Pulse Rate: 75 SpO2: 100 %   Anthropometric Measurements Height: 5\' 1"  (1.549 m) Weight: 145 lb (65.8 kg) BMI (Calculated): 27.41 Weight at Last Visit: 144lb Weight Lost Since Last Visit: 0 Weight Gained Since Last Visit: 1lb Starting Weight: 170lb Total Weight Loss (lbs): 25 lb (11.3 kg)   Body Composition  Body Fat %: 38.2 % Fat Mass (lbs): 55.4 lbs Muscle Mass (lbs): 85.2 lbs Total Body Water (lbs): 63.4 lbs Visceral Fat Rating : 9   Other Clinical Data Fasting: Yes Labs: no Today's Visit #: 27 Starting Date: 07/07/21    Chief Complaint:   OBESITY Kim Clements is here to discuss her progress with her obesity treatment plan. She is on the the Category 1 Plan and states she is following her eating plan approximately 75 % of the time. She states she is not currently exercising.   Interim History:  Kim Clements states "I have been off the plan" the last month or so. She typically consumes [employer] once a week, had it 5 times last week  Exercise-not been walking regularly Hydration-she estimates to drink 60-70 oz water/day, in times past she was drinking >90 oz/day  Reviewed Bioimpedance Results with pt: Muscle Mass: -1.6 lbs Adipose Mass: +2.6 lbs  Subjective:   1. Healthcare maintenance Her Cardiologist requests Lipid panel Nov 2024 Will complete and forward onto Dr. Rennis Golden   2. Prediabetes Lab Results  Component Value Date   HGBA1C 5.5 11/08/2022   HGBA1C 5.7 (H) 06/17/2022   HGBA1C 5.3 03/04/2022   She is not on any antidiabetic medications at present. She denies family hx of MEN 2 or MTC She denies family hx of pancreatitis.  3. Vitamin D deficiency She is taking OTC Vit D3 1,000 4 caps a day to equal 4,000 per day  Assessment/Plan:   1. Healthcare maintenance Check Labs - Comprehensive metabolic panel  2. Prediabetes Check Labs - Hemoglobin A1c - Insulin,  random  3. Vitamin D deficiency Check Labs - VITAMIN D 25 Hydroxy (Vit-D Deficiency, Fractures)  4. Obesity, current BMI 27.41  Kim Clements is currently in the action stage of change. As such, her goal is to continue with weight loss efforts. She has agreed to the Category 1 Plan.   Exercise goals: For substantial health benefits, adults should do at least 150 minutes (2 hours and 30 minutes) a week of moderate-intensity, or 75 minutes (1 hour and 15 minutes) a week of vigorous-intensity aerobic physical activity, or an equivalent combination of moderate- and vigorous-intensity aerobic activity. Aerobic activity should be performed in episodes of at least 10 minutes, and preferably, it should be spread throughout the week.  Behavioral modification strategies: increasing lean protein intake, decreasing simple carbohydrates, increasing vegetables, increasing water intake, no skipping meals, meal planning and cooking strategies, and planning for success.  Kim Clements has agreed to follow-up with our clinic in 4 weeks. She was informed of the importance of frequent follow-up visits to maximize her success with intensive lifestyle modifications for her multiple health conditions.   Kim Clements was informed we would discuss her lab results at her next visit unless there is a critical issue that needs to be addressed sooner. Kim Clements agreed to keep her next visit at the agreed upon time to discuss these results.  Objective:   Blood pressure 107/74, pulse 75, temperature 97.8 F (36.6 C), height 5\' 1"  (1.549 m), weight 145 lb (65.8 kg), SpO2 100%.  Body mass index is 27.4 kg/m.  General: Cooperative, alert, well developed, in no acute distress. HEENT: Conjunctivae and lids unremarkable. Cardiovascular: Regular rhythm.  Lungs: Normal work of breathing. Neurologic: No focal deficits.   Lab Results  Component Value Date   CREATININE 0.81 11/08/2022   BUN 21 11/08/2022   NA 143 11/08/2022   K 4.4  11/08/2022   CL 105 11/08/2022   CO2 22 11/08/2022   Lab Results  Component Value Date   ALT 17 11/08/2022   AST 15 11/08/2022   ALKPHOS 44 11/08/2022   BILITOT 0.5 11/08/2022   Lab Results  Component Value Date   HGBA1C 5.5 11/08/2022   HGBA1C 5.7 (H) 06/17/2022   HGBA1C 5.3 03/04/2022   HGBA1C 5.7 (H) 10/22/2021   HGBA1C 5.5 07/07/2021   Lab Results  Component Value Date   INSULIN 4.7 11/08/2022   INSULIN 3.1 06/17/2022   INSULIN 4.0 03/04/2022   INSULIN 9.0 10/22/2021   INSULIN 9.4 07/07/2021   Lab Results  Component Value Date   TSH 2.05 12/25/2020   Lab Results  Component Value Date   CHOL 150 06/17/2022   HDL 57 06/17/2022   LDLCALC 81 06/17/2022   TRIG 59 06/17/2022   CHOLHDL 2.6 06/17/2022   Lab Results  Component Value Date   VD25OH 56.1 11/08/2022   VD25OH 54.1 06/17/2022   VD25OH 73.6 03/04/2022   Lab Results  Component Value Date   WBC 6.2 12/25/2020   HGB 14.3 12/25/2020   HCT 41.9 12/25/2020   MCV 88.3 12/25/2020   PLT 260.0 12/25/2020   No results found for: "IRON", "TIBC", "FERRITIN"  Attestation Statements:   Reviewed by clinician on day of visit: allergies, medications, problem list, medical history, surgical history, family history, social history, and previous encounter notes.  I have reviewed the above documentation for accuracy and completeness, and I agree with the above. -  Tyianna Menefee d. Bemnet Trovato, NP-C

## 2023-04-21 ENCOUNTER — Ambulatory Visit (INDEPENDENT_AMBULATORY_CARE_PROVIDER_SITE_OTHER): Payer: 59 | Admitting: Adult Health

## 2023-04-21 ENCOUNTER — Encounter (INDEPENDENT_AMBULATORY_CARE_PROVIDER_SITE_OTHER): Payer: Self-pay | Admitting: Adult Health

## 2023-04-21 VITALS — BP 111/76 | HR 78 | Temp 97.5°F | Ht 61.0 in | Wt 146.0 lb

## 2023-04-21 DIAGNOSIS — E559 Vitamin D deficiency, unspecified: Secondary | ICD-10-CM | POA: Diagnosis not present

## 2023-04-21 DIAGNOSIS — R7303 Prediabetes: Secondary | ICD-10-CM | POA: Diagnosis not present

## 2023-04-21 DIAGNOSIS — Z0000 Encounter for general adult medical examination without abnormal findings: Secondary | ICD-10-CM

## 2023-04-21 DIAGNOSIS — E669 Obesity, unspecified: Secondary | ICD-10-CM

## 2023-04-21 DIAGNOSIS — Z6827 Body mass index (BMI) 27.0-27.9, adult: Secondary | ICD-10-CM

## 2023-04-21 NOTE — Progress Notes (Signed)
WEIGHT SUMMARY AND BIOMETRICS  Vitals Temp: (!) 97.5 F (36.4 C) BP: 111/76 Pulse Rate: 78 SpO2: 99 %   Anthropometric Measurements Height: 5\' 1"  (1.549 m) Weight: 146 lb (66.2 kg) BMI (Calculated): 27.6 Weight at Last Visit: 144 lb Weight Lost Since Last Visit: 0 lb Weight Gained Since Last Visit: 1 lb Starting Weight: 170 lb Total Weight Loss (lbs): 24 lb (10.9 kg)   Body Composition  Body Fat %: 37.5 % Fat Mass (lbs): 55 lbs Muscle Mass (lbs): 87.2 lbs Total Body Water (lbs): 63 lbs Visceral Fat Rating : 9   Other Clinical Data Fasting: no Labs: yes Today's Visit #: 28 Starting Date: 07/07/21    Chief Complaint:   OBESITY Oral is here to discuss her progress with her obesity treatment plan. She is on the the Category 1 Plan and states she is following her eating plan approximately 85 % of the time. She states she is NEAT activities.   Interim History:  Ms. Nissen has reduced her [employer] consumption.  Reviewed Bioimpedance results with pt: Muscle Mass:+2 lbs Adipose Mass: -0.4 lbs  Exercise-she has not been currently exercising.  She has home treadmill- great alternative if weather is in climate.  Subjective:   1. Healthcare maintenance Discussed Labs  Latest Reference Range & Units 11/08/22 10:32 04/05/23 10:20  Vitamin B12 232 - 1,245 pg/mL 357 346   She is not on any oral B12 supplementation B12 low normal  2. Prediabetes Discussed Labs Lab Results  Component Value Date   HGBA1C 5.4 04/05/2023   HGBA1C 5.5 11/08/2022   HGBA1C 5.7 (H) 06/17/2022     Latest Reference Range & Units 04/05/23 10:20  Glucose 70 - 99 mg/dL 82  Hemoglobin O9G 4.8 - 5.6 % 5.4  Est. average glucose Bld gHb Est-mCnc mg/dL 295  INSULIN 2.6 - 28.4 uIU/mL 4.9   She will intermittently experience sugar cravings, trying to watch portion sizes when snacking.  3. Vitamin D deficiency Discussed Labs  Latest Reference Range & Units 11/08/22 10:32  04/05/23 10:20  Vitamin D, 25-Hydroxy 30.0 - 100.0 ng/mL 56.1 49.4   She is taking OTC Vit D 3 1,000, 4 caps per day = 4,000 international units every day  Assessment/Plan:   1. Healthcare maintenance Start OTC [employer] daily and reduce OTC Vit D3 1,000, 2 tabs a day = 2,000 international units daily  2. Prediabetes Continue to limit simple CHO and sugar intake  3. Vitamin D deficiency Start OTC [employer] daily and reduce OTC Vit D3 1,000, 2 tabs a day = 2,000 international units daily  4. Obesity, current BMI 27.7  Alisan is currently in the action stage of change. As such, her goal is to continue with weight loss efforts. She has agreed to the Category 1 Plan.   Exercise goals: For substantial health benefits, adults should do at least 150 minutes (2 hours and 30 minutes) a week of moderate-intensity, or 75 minutes (1 hour and 15 minutes) a week of vigorous-intensity aerobic physical activity, or an equivalent combination of moderate- and vigorous-intensity aerobic activity. Aerobic activity should be performed in episodes of at least 10 minutes, and preferably, it should be spread throughout the week.  Behavioral modification strategies: increasing lean protein intake, decreasing simple carbohydrates, increasing vegetables, increasing water intake, decreasing eating out, no skipping meals, meal planning and cooking strategies, keeping healthy foods in the home, ways to avoid boredom eating, and planning for success.  Victorine has agreed to follow-up  with our clinic in 3 weeks. She was informed of the importance of frequent follow-up visits to maximize her success with intensive lifestyle modifications for her multiple health conditions.   Objective:   Blood pressure 111/76, pulse 78, temperature (!) 97.5 F (36.4 C), height 5\' 1"  (1.549 m), weight 146 lb (66.2 kg), SpO2 99%. Body mass index is 27.59 kg/m.  General: Cooperative, alert, well developed, in no acute  distress. HEENT: Conjunctivae and lids unremarkable. Cardiovascular: Regular rhythm.  Lungs: Normal work of breathing. Neurologic: No focal deficits.   Lab Results  Component Value Date   CREATININE 0.76 04/05/2023   BUN 25 (H) 04/05/2023   NA 141 04/05/2023   K 4.4 04/05/2023   CL 103 04/05/2023   CO2 25 04/05/2023   Lab Results  Component Value Date   ALT 23 04/05/2023   AST 17 04/05/2023   ALKPHOS 45 04/05/2023   BILITOT 0.5 04/05/2023   Lab Results  Component Value Date   HGBA1C 5.4 04/05/2023   HGBA1C 5.5 11/08/2022   HGBA1C 5.7 (H) 06/17/2022   HGBA1C 5.3 03/04/2022   HGBA1C 5.7 (H) 10/22/2021   Lab Results  Component Value Date   INSULIN 4.9 04/05/2023   INSULIN 4.7 11/08/2022   INSULIN 3.1 06/17/2022   INSULIN 4.0 03/04/2022   INSULIN 9.0 10/22/2021   Lab Results  Component Value Date   TSH 2.05 12/25/2020   Lab Results  Component Value Date   CHOL 150 06/17/2022   HDL 57 06/17/2022   LDLCALC 81 06/17/2022   TRIG 59 06/17/2022   CHOLHDL 2.6 06/17/2022   Lab Results  Component Value Date   VD25OH 49.4 04/05/2023   VD25OH 56.1 11/08/2022   VD25OH 54.1 06/17/2022   Lab Results  Component Value Date   WBC 6.2 12/25/2020   HGB 14.3 12/25/2020   HCT 41.9 12/25/2020   MCV 88.3 12/25/2020   PLT 260.0 12/25/2020   No results found for: "IRON", "TIBC", "FERRITIN"  Attestation Statements:   Reviewed by clinician on day of visit: allergies, medications, problem list, medical history, surgical history, family history, social history, and previous encounter notes.  I have reviewed the above documentation for accuracy and completeness, and I agree with the above. -  Carlisia Geno d. Amyra Vantuyl, NP-C

## 2023-05-12 ENCOUNTER — Encounter (INDEPENDENT_AMBULATORY_CARE_PROVIDER_SITE_OTHER): Payer: Self-pay | Admitting: Adult Health

## 2023-05-12 ENCOUNTER — Ambulatory Visit (INDEPENDENT_AMBULATORY_CARE_PROVIDER_SITE_OTHER): Payer: 59 | Admitting: Adult Health

## 2023-05-12 VITALS — BP 117/73 | HR 81 | Temp 97.9°F | Ht 61.0 in | Wt 145.0 lb

## 2023-05-12 DIAGNOSIS — R7303 Prediabetes: Secondary | ICD-10-CM | POA: Diagnosis not present

## 2023-05-12 DIAGNOSIS — E559 Vitamin D deficiency, unspecified: Secondary | ICD-10-CM

## 2023-05-12 DIAGNOSIS — E669 Obesity, unspecified: Secondary | ICD-10-CM

## 2023-05-12 DIAGNOSIS — F411 Generalized anxiety disorder: Secondary | ICD-10-CM | POA: Diagnosis not present

## 2023-05-12 DIAGNOSIS — Z6827 Body mass index (BMI) 27.0-27.9, adult: Secondary | ICD-10-CM

## 2023-05-12 NOTE — Progress Notes (Signed)
WEIGHT SUMMARY AND BIOMETRICS  Vitals Temp: 97.9 F (36.6 C) BP: 117/73 Pulse Rate: 81 SpO2: 98 %   Anthropometric Measurements Height: 5\' 1"  (1.549 m) Weight: 145 lb (65.8 kg) BMI (Calculated): 27.41 Weight at Last Visit: 146lb Weight Lost Since Last Visit: 1lb Weight Gained Since Last Visit: 0 Starting Weight: 170lb Total Weight Loss (lbs): 25 lb (11.3 kg)   Body Composition  Body Fat %: 37.5 % Fat Mass (lbs): 54.6 lbs Muscle Mass (lbs): 86.6 lbs Total Body Water (lbs): 63.2 lbs Visceral Fat Rating : 9   Other Clinical Data Fasting: no Labs: no Today's Visit #: 29 Starting Date: 07/07/21    Chief Complaint:   OBESITY Kim Clements is here to discuss her progress with her obesity treatment plan. She is on the the Category 1 Plan and states she is following her eating plan approximately 80 % of the time. She states she is exercising Brisk Walking 20-30 minutes 3 times per week.   Interim History:  Kim Clements is still leery of [place] product in light of the recent Recall. Discussed at length other lunch options.  Reviewed Bioimpedance results with pt: Muscle Mass: -0.6 lb Adipose Mass: -0.4 lb   Hydration-she tried to drink at least 60 oz water/day  Of Note- Her husband and daughter are BOTH at [facility] during OV  Subjective:   1. Vitamin D deficiency  Latest Reference Range & Units 11/08/22 10:32 04/05/23 10:20  Vitamin D, 25-Hydroxy 30.0 - 100.0 ng/mL 56.1 49.4   04/21/2023- Recommended- Start OTC Womens Centrum daily and reduce OTC Vit D3 1,000, 2 tabs a day = 2,000 international units daily   2. Prediabetes Lab Results  Component Value Date   HGBA1C 5.4 04/05/2023   HGBA1C 5.5 11/08/2022   HGBA1C 5.7 (H) 06/17/2022     Latest Reference Range & Units 06/17/22 10:32 11/08/22 10:32 04/05/23 10:20  INSULIN 2.6 - 24.9 uIU/mL 3.1 4.7 4.9   A1c has been improving with lifestyle modifications. Insulin level has been stable for quite  awhile. She has never been on Metformin therapy  3. GAD (generalized anxiety disorder) She is on nightly Amitriptyline 10mg  She is really unsure as to why she is on this medication. She denies SE with TCA use PCP is currently providing refills on daily Amitriptyline.  Assessment/Plan:   1. Vitamin D deficiency Start- Start OTC [employer] daily and reduce OTC Vit D3 1,000, 2 tabs a day = 2,000 international units daily   2. Prediabetes Continue healthy eating and regular exercise  3. GAD (generalized anxiety disorder) Continue healthy eating, regular exercise, and daily low dose TCA therapy  4. Obesity, current BMI 27.41  Kim Clements is currently in the action stage of change. As such, her goal is to continue with weight loss efforts. She has agreed to the Category 1 Plan.   Exercise goals: For substantial health benefits, adults should do at least 150 minutes (2 hours and 30 minutes) a week of moderate-intensity, or 75 minutes (1 hour and 15 minutes) a week of vigorous-intensity aerobic physical activity, or an equivalent combination of moderate- and vigorous-intensity aerobic activity. Aerobic activity should be performed in episodes of at least 10 minutes, and preferably, it should be spread throughout the week.  Behavioral modification strategies: increasing lean protein intake, decreasing simple carbohydrates, increasing vegetables, increasing water intake, no skipping meals, meal planning and cooking strategies, keeping healthy foods in the home, better snacking choices, and planning for success.  Kim Clements has agreed  to follow-up with our clinic in 4 weeks. She was informed of the importance of frequent follow-up visits to maximize her success with intensive lifestyle modifications for her multiple health conditions.   Objective:   Blood pressure 117/73, pulse 81, temperature 97.9 F (36.6 C), height 5\' 1"  (1.549 m), weight 145 lb (65.8 kg), SpO2 98%. Body mass index is  27.4 kg/m.  General: Cooperative, alert, well developed, in no acute distress. HEENT: Conjunctivae and lids unremarkable. Cardiovascular: Regular rhythm.  Lungs: Normal work of breathing. Neurologic: No focal deficits.   Lab Results  Component Value Date   CREATININE 0.76 04/05/2023   BUN 25 (H) 04/05/2023   NA 141 04/05/2023   K 4.4 04/05/2023   CL 103 04/05/2023   CO2 25 04/05/2023   Lab Results  Component Value Date   ALT 23 04/05/2023   AST 17 04/05/2023   ALKPHOS 45 04/05/2023   BILITOT 0.5 04/05/2023   Lab Results  Component Value Date   HGBA1C 5.4 04/05/2023   HGBA1C 5.5 11/08/2022   HGBA1C 5.7 (H) 06/17/2022   HGBA1C 5.3 03/04/2022   HGBA1C 5.7 (H) 10/22/2021   Lab Results  Component Value Date   INSULIN 4.9 04/05/2023   INSULIN 4.7 11/08/2022   INSULIN 3.1 06/17/2022   INSULIN 4.0 03/04/2022   INSULIN 9.0 10/22/2021   Lab Results  Component Value Date   TSH 2.05 12/25/2020   Lab Results  Component Value Date   CHOL 150 06/17/2022   HDL 57 06/17/2022   LDLCALC 81 06/17/2022   TRIG 59 06/17/2022   CHOLHDL 2.6 06/17/2022   Lab Results  Component Value Date   VD25OH 49.4 04/05/2023   VD25OH 56.1 11/08/2022   VD25OH 54.1 06/17/2022   Lab Results  Component Value Date   WBC 6.2 12/25/2020   HGB 14.3 12/25/2020   HCT 41.9 12/25/2020   MCV 88.3 12/25/2020   PLT 260.0 12/25/2020   No results found for: "IRON", "TIBC", "FERRITIN"  Attestation Statements:   Reviewed by clinician on day of visit: allergies, medications, problem list, medical history, surgical history, family history, social history, and previous encounter notes.  Time spent on visit including pre-visit chart review and post-visit care and charting was 50+ minutes.  Extra time is needed to accommodate entire family at OV  I have reviewed the above documentation for accuracy and completeness, and I agree with the above. -  Kim Clements d. Kharter Sestak, NP-C

## 2023-05-20 ENCOUNTER — Other Ambulatory Visit: Payer: Self-pay | Admitting: Physician Assistant

## 2023-05-20 DIAGNOSIS — G8929 Other chronic pain: Secondary | ICD-10-CM

## 2023-05-20 NOTE — Telephone Encounter (Signed)
Patient called for an update. States she will be running out of medication this weekend.

## 2023-06-06 ENCOUNTER — Encounter (INDEPENDENT_AMBULATORY_CARE_PROVIDER_SITE_OTHER): Payer: Self-pay | Admitting: Adult Health

## 2023-06-06 ENCOUNTER — Ambulatory Visit (INDEPENDENT_AMBULATORY_CARE_PROVIDER_SITE_OTHER): Payer: 59 | Admitting: Adult Health

## 2023-06-06 VITALS — BP 126/78 | HR 79 | Temp 97.6°F | Ht 61.0 in | Wt 146.0 lb

## 2023-06-06 DIAGNOSIS — E669 Obesity, unspecified: Secondary | ICD-10-CM | POA: Diagnosis not present

## 2023-06-06 DIAGNOSIS — Z6827 Body mass index (BMI) 27.0-27.9, adult: Secondary | ICD-10-CM

## 2023-06-06 DIAGNOSIS — E559 Vitamin D deficiency, unspecified: Secondary | ICD-10-CM

## 2023-06-06 DIAGNOSIS — R7303 Prediabetes: Secondary | ICD-10-CM

## 2023-06-06 DIAGNOSIS — E66811 Obesity, class 1: Secondary | ICD-10-CM

## 2023-06-06 NOTE — Progress Notes (Signed)
WEIGHT SUMMARY AND BIOMETRICS  Vitals Temp: 97.6 F (36.4 C) BP: 126/78 Pulse Rate: 79 SpO2: 96 %   Anthropometric Measurements Height: 5\' 1"  (1.549 m) Weight: 146 lb (66.2 kg) BMI (Calculated): 27.6 Weight at Last Visit: 145lb Weight Lost Since Last Visit: 0 Weight Gained Since Last Visit: 1 lb Starting Weight: 170lb Total Weight Loss (lbs): 24 lb (10.9 kg)   Body Composition  Body Fat %: 38.1 % Fat Mass (lbs): 56 lbs Muscle Mass (lbs): 86.2 lbs Total Body Water (lbs): 63.4 lbs Visceral Fat Rating : 9   Other Clinical Data Fasting: no Labs: no Today's Visit #: 30 Starting Date: 07/07/21    Chief Complaint:   OBESITY Kim Clements is here to discuss her progress with her obesity treatment plan. She is on the the Category 1 Plan and states she is following her eating plan approximately 75 % of the time. She states she is not currently exercising.   Interim History:  She and her husband has been unable to find time to walk in evenings. Dicussed moving walk to mid-morning to allow for more consistent exercise habits.  Reviewed Bioimpedance Results with pt: Muscle Mass: -0.4 lbs Adipose Mass: +1.4 lbs  Starting Visceral Adipose Rating 11 Current Visceral Adipose Rating 9 Lowest Visceral Adipose Rating 8  Subjective:   1. Vitamin D deficiency  Latest Reference Range & Units 11/08/22 10:32 04/05/23 10:20  Vitamin D, 25-Hydroxy 30.0 - 100.0 ng/mL 56.1 49.4   At last OV on 05/12/2023 was instructed to Start OTC [facility] daily and reduce OTC Vit D3 1,000, 2 tabs a day = 2,000 international units daily   2. Prediabetes Lab Results  Component Value Date   HGBA1C 5.4 04/05/2023   HGBA1C 5.5 11/08/2022   HGBA1C 5.7 (H) 06/17/2022    Discussed risks/benefits of GLP-1 therapy. She prefers to manage with lifestyle only at this time.  Assessment/Plan:   1. Vitamin D deficiency Continue OTC Womens Centrum daily and reduce OTC Vit D3 1,000, 2 tabs a  day = 2,000 international units daily   2. Prediabetes Continue Cat 1 meal plan and strive to walk at least 2-3 times per week.  3. Obesity, current BMI 27.6  [name] is currently in the action stage of change. As such, her goal is to continue with weight loss efforts. She has agreed to the Category 1 Plan.   Exercise goals: For substantial health benefits, adults should do at least 150 minutes (2 hours and 30 minutes) a week of moderate-intensity, or 75 minutes (1 hour and 15 minutes) a week of vigorous-intensity aerobic physical activity, or an equivalent combination of moderate- and vigorous-intensity aerobic activity. Aerobic activity should be performed in episodes of at least 10 minutes, and preferably, it should be spread throughout the week.  Change walking from evening to mid- morning.  Behavioral modification strategies: increasing lean protein intake, decreasing simple carbohydrates, increasing vegetables, increasing water intake, no skipping meals, meal planning and cooking strategies, ways to avoid boredom eating, and planning for success.  Kinzleigh has agreed to follow-up with our clinic in 4 weeks. She was informed of the importance of frequent follow-up visits to maximize her success with intensive lifestyle modifications for her multiple health conditions.   Objective:   Blood pressure 126/78, pulse 79, temperature 97.6 F (36.4 C), height 5\' 1"  (1.549 m), weight 146 lb (66.2 kg), SpO2 96%. Body mass index is 27.59 kg/m.  General: Cooperative, alert, well developed, in no acute distress. HEENT:  Conjunctivae and lids unremarkable. Cardiovascular: Regular rhythm.  Lungs: Normal work of breathing. Neurologic: No focal deficits.   Lab Results  Component Value Date   CREATININE 0.76 04/05/2023   BUN 25 (H) 04/05/2023   NA 141 04/05/2023   K 4.4 04/05/2023   CL 103 04/05/2023   CO2 25 04/05/2023   Lab Results  Component Value Date   ALT 23 04/05/2023   AST 17  04/05/2023   ALKPHOS 45 04/05/2023   BILITOT 0.5 04/05/2023   Lab Results  Component Value Date   HGBA1C 5.4 04/05/2023   HGBA1C 5.5 11/08/2022   HGBA1C 5.7 (H) 06/17/2022   HGBA1C 5.3 03/04/2022   HGBA1C 5.7 (H) 10/22/2021   Lab Results  Component Value Date   INSULIN 4.9 04/05/2023   INSULIN 4.7 11/08/2022   INSULIN 3.1 06/17/2022   INSULIN 4.0 03/04/2022   INSULIN 9.0 10/22/2021   Lab Results  Component Value Date   TSH 2.05 12/25/2020   Lab Results  Component Value Date   CHOL 150 06/17/2022   HDL 57 06/17/2022   LDLCALC 81 06/17/2022   TRIG 59 06/17/2022   CHOLHDL 2.6 06/17/2022   Lab Results  Component Value Date   VD25OH 49.4 04/05/2023   VD25OH 56.1 11/08/2022   VD25OH 54.1 06/17/2022   Lab Results  Component Value Date   WBC 6.2 12/25/2020   HGB 14.3 12/25/2020   HCT 41.9 12/25/2020   MCV 88.3 12/25/2020   PLT 260.0 12/25/2020   No results found for: "IRON", "TIBC", "FERRITIN"  Attestation Statements:   Reviewed by clinician on day of visit: allergies, medications, problem list, medical history, surgical history, family history, social history, and previous encounter notes.  Time spent on visit including pre-visit chart review and post-visit care and charting was 45 minutes.   Due to seeing both she and her husband during same OV- extra time is needed to chart review, pre-chart, and manage care.  I have reviewed the above documentation for accuracy and completeness, and I agree with the above. -  Kwabena Strutz d. Rhodia Acres, NP-C

## 2023-06-15 ENCOUNTER — Encounter: Payer: Self-pay | Admitting: Physician Assistant

## 2023-06-15 NOTE — Telephone Encounter (Signed)
Please see pt questions and advise

## 2023-07-06 ENCOUNTER — Encounter (INDEPENDENT_AMBULATORY_CARE_PROVIDER_SITE_OTHER): Payer: Self-pay | Admitting: Adult Health

## 2023-07-06 ENCOUNTER — Ambulatory Visit (INDEPENDENT_AMBULATORY_CARE_PROVIDER_SITE_OTHER): Payer: 59 | Admitting: Adult Health

## 2023-07-06 ENCOUNTER — Other Ambulatory Visit: Payer: Self-pay | Admitting: Obstetrics and Gynecology

## 2023-07-06 VITALS — BP 105/73 | HR 82 | Temp 97.5°F | Ht 61.0 in | Wt 147.0 lb

## 2023-07-06 DIAGNOSIS — R7303 Prediabetes: Secondary | ICD-10-CM | POA: Diagnosis not present

## 2023-07-06 DIAGNOSIS — E559 Vitamin D deficiency, unspecified: Secondary | ICD-10-CM | POA: Diagnosis not present

## 2023-07-06 DIAGNOSIS — Z9189 Other specified personal risk factors, not elsewhere classified: Secondary | ICD-10-CM

## 2023-07-06 DIAGNOSIS — E669 Obesity, unspecified: Secondary | ICD-10-CM | POA: Diagnosis not present

## 2023-07-06 DIAGNOSIS — E66811 Obesity, class 1: Secondary | ICD-10-CM

## 2023-07-06 DIAGNOSIS — Z6827 Body mass index (BMI) 27.0-27.9, adult: Secondary | ICD-10-CM | POA: Diagnosis not present

## 2023-07-06 LAB — HM PAP SMEAR

## 2023-07-06 NOTE — Progress Notes (Signed)
WEIGHT SUMMARY AND BIOMETRICS  Vitals Temp: (!) 97.5 F (36.4 C) BP: 105/73 Pulse Rate: 82 SpO2: 98 %   Anthropometric Measurements Height: 5\' 1"  (1.549 m) Weight: 147 lb (66.7 kg) BMI (Calculated): 27.79 Weight at Last Visit: 146lb Weight Lost Since Last Visit: 0 Weight Gained Since Last Visit: 1lb Starting Weight: 170lb Total Weight Loss (lbs): 23 lb (10.4 kg)   Body Composition  Body Fat %: 37.1 % Fat Mass (lbs): 54.6 lbs Muscle Mass (lbs): 87.8 lbs Total Body Water (lbs): 62.4 lbs Visceral Fat Rating : 9   Other Clinical Data Fasting: no Labs: no Today's Visit #: 31 Starting Date: 07/07/21    Chief Complaint:   OBESITY Kim Clements is here to discuss her progress with her obesity treatment plan. She is on the the Category 1 Plan and states she is following her eating plan approximately 75 % of the time. She states she is exercising Walking daily.   Interim History:  Reviewed Bioimpedance results with pt: Muscle Mass: +1.6 lbs Adipose Mass: -1.4 lbs  Hunger/appetite-she endorses increased "stress eating", consuming Salt/Vinegar Chips and [employer] "Pink Drink"  Stress- she endorses increased anxiety r/t to her husbands squamous cell carcinoma on forehead. He underwent Mohs procedure 07/04/2023.  Exercise-NEAT Activities  Hydration-she endorses reduced water intake from 90 oz to between 60-80 oz/day  Of Note- Husband at [facility] during OV   Subjective:   1. Prediabetes Lab Results  Component Value Date   HGBA1C 5.4 04/05/2023   HGBA1C 5.5 11/08/2022   HGBA1C 5.7 (H) 06/17/2022    She reports increased sugar/simple CHO cravings due to her husband's recent health concern, squamous cell carcinoma on forehead.   2. Vitamin D deficiency  Latest Reference Range & Units 11/08/22 10:32 04/05/23 10:20  Vitamin D, 25-Hydroxy 30.0 - 100.0 ng/mL 56.1 49.4   She endorsee stable energy levels She is on  Cholecalciferol (VITAMIN D3) 25 MCG (1000 UT) CAPS  Take 4 capsules by mouth daily. 4000 IU/daily   Assessment/Plan:   1. Prediabetes Reduce sugar/simple CHO Increase protein and regular activity  2. Vitamin D deficiency Continue  Cholecalciferol (VITAMIN D3) 25 MCG (1000 UT) CAPS Take 4 capsules by mouth daily. 4000 IU/daily   3. Obesity, current BMI 27.6  Kim Clements is currently in the action stage of change. As such, her goal is to continue with weight loss efforts. She has agreed to the Category 1 Plan.   Exercise goals: For substantial health benefits, adults should do at least 150 minutes (2 hours and 30 minutes) a week of moderate-intensity, or 75 minutes (1 hour and 15 minutes) a week of vigorous-intensity aerobic physical activity, or an equivalent combination of moderate- and vigorous-intensity aerobic activity. Aerobic activity should be performed in episodes of at least 10 minutes, and preferably, it should be spread throughout the week.  Behavioral modification strategies: increasing lean protein intake, decreasing simple carbohydrates, increasing vegetables, increasing water intake, no skipping meals, meal planning and cooking strategies, keeping healthy foods in the home, ways to avoid boredom eating, emotional eating strategies, and planning for success.  Kim Clements has agreed to follow-up with our clinic in 4 weeks. She was informed of the importance of frequent follow-up visits to maximize her success with intensive lifestyle modifications for her multiple health conditions.   Check Fasting Labs at next OV  Objective:   Blood pressure 105/73, pulse 82, temperature (!) 97.5 F (36.4 C), height 5\' 1"  (1.549 m), weight 147 lb (66.7 kg), SpO2 98%. Body  mass index is 27.78 kg/m.  General: Cooperative, alert, well developed, in no acute distress. HEENT: Conjunctivae and lids unremarkable. Cardiovascular: Regular rhythm.  Lungs: Normal work of breathing. Neurologic: No focal deficits.   Lab Results  Component Value Date    CREATININE 0.76 04/05/2023   BUN 25 (H) 04/05/2023   NA 141 04/05/2023   K 4.4 04/05/2023   CL 103 04/05/2023   CO2 25 04/05/2023   Lab Results  Component Value Date   ALT 23 04/05/2023   AST 17 04/05/2023   ALKPHOS 45 04/05/2023   BILITOT 0.5 04/05/2023   Lab Results  Component Value Date   HGBA1C 5.4 04/05/2023   HGBA1C 5.5 11/08/2022   HGBA1C 5.7 (H) 06/17/2022   HGBA1C 5.3 03/04/2022   HGBA1C 5.7 (H) 10/22/2021   Lab Results  Component Value Date   INSULIN 4.9 04/05/2023   INSULIN 4.7 11/08/2022   INSULIN 3.1 06/17/2022   INSULIN 4.0 03/04/2022   INSULIN 9.0 10/22/2021   Lab Results  Component Value Date   TSH 2.05 12/25/2020   Lab Results  Component Value Date   CHOL 150 06/17/2022   HDL 57 06/17/2022   LDLCALC 81 06/17/2022   TRIG 59 06/17/2022   CHOLHDL 2.6 06/17/2022   Lab Results  Component Value Date   VD25OH 49.4 04/05/2023   VD25OH 56.1 11/08/2022   VD25OH 54.1 06/17/2022   Lab Results  Component Value Date   WBC 6.2 12/25/2020   HGB 14.3 12/25/2020   HCT 41.9 12/25/2020   MCV 88.3 12/25/2020   PLT 260.0 12/25/2020   No results found for: "IRON", "TIBC", "FERRITIN"   Attestation Statements:   Reviewed by clinician on day of visit: allergies, medications, problem list, medical history, surgical history, family history, social history, and previous encounter notes.  Time spent on visit including pre-visit chart review and post-visit care and charting was 28 minutes.   Due to seeing both her and her husband during same OV- extra time is needed to chart review, pre-chart, and manage care.   I have reviewed the above documentation for accuracy and completeness, and I agree with the above. -  Fraidy Mccarrick d. Amiracle Neises, NP-C

## 2023-07-27 ENCOUNTER — Ambulatory Visit (INDEPENDENT_AMBULATORY_CARE_PROVIDER_SITE_OTHER): Payer: 59 | Admitting: Adult Health

## 2023-07-27 ENCOUNTER — Encounter (INDEPENDENT_AMBULATORY_CARE_PROVIDER_SITE_OTHER): Payer: Self-pay | Admitting: Adult Health

## 2023-07-27 VITALS — BP 101/67 | HR 80 | Temp 97.4°F | Ht 61.0 in | Wt 146.0 lb

## 2023-07-27 DIAGNOSIS — Z6827 Body mass index (BMI) 27.0-27.9, adult: Secondary | ICD-10-CM

## 2023-07-27 DIAGNOSIS — E782 Mixed hyperlipidemia: Secondary | ICD-10-CM | POA: Diagnosis not present

## 2023-07-27 DIAGNOSIS — E559 Vitamin D deficiency, unspecified: Secondary | ICD-10-CM

## 2023-07-27 DIAGNOSIS — R7303 Prediabetes: Secondary | ICD-10-CM

## 2023-07-27 DIAGNOSIS — E669 Obesity, unspecified: Secondary | ICD-10-CM | POA: Diagnosis not present

## 2023-07-27 DIAGNOSIS — E66811 Obesity, class 1: Secondary | ICD-10-CM

## 2023-07-27 NOTE — Progress Notes (Signed)
WEIGHT SUMMARY AND BIOMETRICS  Vitals Temp: (!) 97.4 F (36.3 C) BP: 101/67 Pulse Rate: 80 SpO2: 98 %   Anthropometric Measurements Height: 5\' 1"  (1.549 m) Weight: 146 lb (66.2 kg) BMI (Calculated): 27.6 Weight at Last Visit: 147lb Weight Lost Since Last Visit: 1lb Weight Gained Since Last Visit: 0 Starting Weight: 170lb Total Weight Loss (lbs): 24 lb (10.9 kg)   Body Composition  Body Fat %: 37.8 % Fat Mass (lbs): 55.4 lbs Muscle Mass (lbs): 86.8 lbs Total Body Water (lbs): 62.8 lbs Visceral Fat Rating : 9   Other Clinical Data Fasting: yes Labs: yes Today's Visit #: 32 Starting Date: 07/07/21    Chief Complaint:   OBESITY Kim Clements is here to discuss her progress with her obesity treatment plan. She is on the the Category 1 Plan and states she is following her eating plan approximately 80 % of the time. She states she is not currently exercising.   Interim History:  Ms. Lusky reports eating out more frequently there last month. She also endorses increase in emotional eating the last 6 months, ie: various family members ill or injured, church community managing difficult personal issues  She and her husband will host 22-26 people for Thanksgiving this year.  Reviewed Bioimpedance results with pt: Muscle Mass: -1 lb Adipose Mass: -0.2 lb  Weight 07/2022 146 lbs Weight today 146 lbs  Of note- pt's husband at [facility] during OV  Subjective:   1. Mixed hyperlipidemia Cards manages bi- weekly Repatha injections- tolerating well Lipid Panel     Component Value Date/Time   CHOL 150 06/17/2022 1032   TRIG 59 06/17/2022 1032   HDL 57 06/17/2022 1032   CHOLHDL 2.6 06/17/2022 1032   CHOLHDL 4 12/25/2020 1130   VLDL 24.2 12/25/2020 1130   LDLCALC 81 06/17/2022 1032   LABVLDL 12 06/17/2022 1032     2. Prediabetes Lab Results  Component Value Date   HGBA1C 5.4 04/05/2023   HGBA1C 5.5 11/08/2022   HGBA1C 5.7 (H) 06/17/2022    She also endorses  increase in emotional eating the last 6 months, ie: various family members ill or injured, church community managing difficult personal issues  3. Vitamin D deficiency  Latest Reference Range & Units 11/08/22 10:32 04/05/23 10:20  Vitamin D, 25-Hydroxy 30.0 - 100.0 ng/mL 56.1 49.4   She is on daily OTC Vit D 3 1,000 international units  4 caps per day= 5,000 international units/day She endorses stable energy levels  Assessment/Plan:   1. Mixed hyperlipidemia Check Labs - Comprehensive metabolic panel - Lipid panel Forward onto Cards/Dr.   2. Prediabetes Check Labs - Hemoglobin A1c - Insulin, random  3. Vitamin D deficiency Check Labs - VITAMIN D 25 Hydroxy (Vit-D Deficiency, Fractures)  4. Obesity, current BMI 27.6  Ednamae is currently in the action stage of change. As such, her goal is to continue with weight loss efforts. She has agreed to the Category 1 Plan.   Exercise goals: All adults should avoid inactivity. Some physical activity is better than none, and adults who participate in any amount of physical activity gain some health benefits. Adults should also include muscle-strengthening activities that involve all major muscle groups on 2 or more days a week.  Behavioral modification strategies: increasing lean protein intake, decreasing simple carbohydrates, increasing vegetables, increasing water intake, decreasing eating out, meal planning and cooking strategies, keeping healthy foods in the home, ways to avoid boredom eating, holiday eating strategies , and planning for success.  Aylia has agreed to follow-up with our clinic in 4 weeks. She was informed of the importance of frequent follow-up visits to maximize her success with intensive lifestyle modifications for her multiple health conditions.   Zitong was informed we would discuss her lab results at her next visit unless there is a critical issue that needs to be addressed sooner. Chi agreed to keep  her next visit at the agreed upon time to discuss these results.  Objective:   Blood pressure 101/67, pulse 80, temperature (!) 97.4 F (36.3 C), height 5\' 1"  (1.549 m), weight 146 lb (66.2 kg), SpO2 98%. Body mass index is 27.59 kg/m.  General: Cooperative, alert, well developed, in no acute distress. HEENT: Conjunctivae and lids unremarkable. Cardiovascular: Regular rhythm.  Lungs: Normal work of breathing. Neurologic: No focal deficits.   Lab Results  Component Value Date   CREATININE 0.76 04/05/2023   BUN 25 (H) 04/05/2023   NA 141 04/05/2023   K 4.4 04/05/2023   CL 103 04/05/2023   CO2 25 04/05/2023   Lab Results  Component Value Date   ALT 23 04/05/2023   AST 17 04/05/2023   ALKPHOS 45 04/05/2023   BILITOT 0.5 04/05/2023   Lab Results  Component Value Date   HGBA1C 5.4 04/05/2023   HGBA1C 5.5 11/08/2022   HGBA1C 5.7 (H) 06/17/2022   HGBA1C 5.3 03/04/2022   HGBA1C 5.7 (H) 10/22/2021   Lab Results  Component Value Date   INSULIN 4.9 04/05/2023   INSULIN 4.7 11/08/2022   INSULIN 3.1 06/17/2022   INSULIN 4.0 03/04/2022   INSULIN 9.0 10/22/2021   Lab Results  Component Value Date   TSH 2.05 12/25/2020   Lab Results  Component Value Date   CHOL 150 06/17/2022   HDL 57 06/17/2022   LDLCALC 81 06/17/2022   TRIG 59 06/17/2022   CHOLHDL 2.6 06/17/2022   Lab Results  Component Value Date   VD25OH 49.4 04/05/2023   VD25OH 56.1 11/08/2022   VD25OH 54.1 06/17/2022   Lab Results  Component Value Date   WBC 6.2 12/25/2020   HGB 14.3 12/25/2020   HCT 41.9 12/25/2020   MCV 88.3 12/25/2020   PLT 260.0 12/25/2020   No results found for: "IRON", "TIBC", "FERRITIN"  Attestation Statements:   Reviewed by clinician on day of visit: allergies, medications, problem list, medical history, surgical history, family history, social history, and previous encounter notes.  Due to seeing both her and her husband during same OV- extra time is needed to chart review,  pre-chart, and manage care.   I have reviewed the above documentation for accuracy and completeness, and I agree with the above. -  Orazio Weller d. Maurico Perrell, NP-C

## 2023-07-28 LAB — COMPREHENSIVE METABOLIC PANEL
ALT: 16 [IU]/L (ref 0–32)
AST: 16 [IU]/L (ref 0–40)
Albumin: 4.7 g/dL (ref 3.8–4.9)
Alkaline Phosphatase: 51 [IU]/L (ref 44–121)
BUN/Creatinine Ratio: 29 — ABNORMAL HIGH (ref 9–23)
BUN: 23 mg/dL (ref 6–24)
Bilirubin Total: 0.5 mg/dL (ref 0.0–1.2)
CO2: 24 mmol/L (ref 20–29)
Calcium: 9.9 mg/dL (ref 8.7–10.2)
Chloride: 103 mmol/L (ref 96–106)
Creatinine, Ser: 0.8 mg/dL (ref 0.57–1.00)
Globulin, Total: 2 g/dL (ref 1.5–4.5)
Glucose: 78 mg/dL (ref 70–99)
Potassium: 4.4 mmol/L (ref 3.5–5.2)
Sodium: 141 mmol/L (ref 134–144)
Total Protein: 6.7 g/dL (ref 6.0–8.5)
eGFR: 86 mL/min/{1.73_m2} (ref >59)

## 2023-07-28 LAB — LIPID PANEL
Chol/HDL Ratio: 1.8 ratio (ref 0.0–4.4)
Cholesterol, Total: 114 mg/dL (ref 100–199)
HDL: 64 mg/dL (ref >39)
LDL Chol Calc (NIH): 37 mg/dL (ref 0–99)
Triglycerides: 61 mg/dL (ref 0–149)
VLDL Cholesterol Cal: 13 mg/dL (ref 5–40)

## 2023-07-28 LAB — HEMOGLOBIN A1C
Est. average glucose Bld gHb Est-mCnc: 111 mg/dL
Hgb A1c MFr Bld: 5.5 % (ref 4.8–5.6)

## 2023-07-28 LAB — VITAMIN D 25 HYDROXY (VIT D DEFICIENCY, FRACTURES): Vit D, 25-Hydroxy: 60.1 ng/mL (ref 30.0–100.0)

## 2023-07-28 LAB — INSULIN, RANDOM: INSULIN: 4.3 u[IU]/mL (ref 2.6–24.9)

## 2023-08-01 ENCOUNTER — Other Ambulatory Visit: Payer: Self-pay | Admitting: Gastroenterology

## 2023-08-01 ENCOUNTER — Telehealth: Payer: Self-pay | Admitting: Gastroenterology

## 2023-08-01 NOTE — Telephone Encounter (Signed)
Patient called and stated that she needs a refill medication on [facility]iber[facility]. Patient also stated that her insurance only approves 90 day supply but if not she would like a call back. Please advise.

## 2023-08-02 NOTE — Telephone Encounter (Signed)
This was faxed yesterday

## 2023-08-03 ENCOUNTER — Other Ambulatory Visit: Payer: Self-pay | Admitting: Gastroenterology

## 2023-08-03 NOTE — Telephone Encounter (Signed)
Patient called again regarding her request for a refill on Viberzi. I stated that it had been faxed over. Patient stated that the pharmacy has not received anything. Patient stated she would like a call knowing that her refill has been sent over. Please advise.

## 2023-08-08 MED ORDER — VIBERZI 75 MG PO TABS: 1.0000 | ORAL_TABLET | Freq: Two times a day (BID) | ORAL | 1 refills | Status: DC

## 2023-08-08 NOTE — Telephone Encounter (Signed)
Spoke with [facility] pharmacist. No prescription received. Verbal prescription given. Because this is a controlled medication, the prescription will expire in 6 months. Refills changed to reflect this.

## 2023-08-15 ENCOUNTER — Other Ambulatory Visit: Payer: Self-pay | Admitting: Physician Assistant

## 2023-08-15 DIAGNOSIS — G8929 Other chronic pain: Secondary | ICD-10-CM

## 2023-08-22 ENCOUNTER — Other Ambulatory Visit: Payer: Self-pay | Admitting: Internal Medicine

## 2023-08-24 ENCOUNTER — Ambulatory Visit (INDEPENDENT_AMBULATORY_CARE_PROVIDER_SITE_OTHER): Payer: 59 | Admitting: Adult Health

## 2023-08-24 ENCOUNTER — Encounter (INDEPENDENT_AMBULATORY_CARE_PROVIDER_SITE_OTHER): Payer: Self-pay | Admitting: Adult Health

## 2023-08-24 VITALS — BP 101/67 | HR 76 | Temp 97.8°F | Ht 61.0 in | Wt 147.0 lb

## 2023-08-24 DIAGNOSIS — E559 Vitamin D deficiency, unspecified: Secondary | ICD-10-CM

## 2023-08-24 DIAGNOSIS — E782 Mixed hyperlipidemia: Secondary | ICD-10-CM | POA: Diagnosis not present

## 2023-08-24 DIAGNOSIS — Z6827 Body mass index (BMI) 27.0-27.9, adult: Secondary | ICD-10-CM

## 2023-08-24 DIAGNOSIS — E669 Obesity, unspecified: Secondary | ICD-10-CM | POA: Diagnosis not present

## 2023-08-24 DIAGNOSIS — R7303 Prediabetes: Secondary | ICD-10-CM | POA: Diagnosis not present

## 2023-08-24 DIAGNOSIS — Z6832 Body mass index (BMI) 32.0-32.9, adult: Secondary | ICD-10-CM

## 2023-08-24 NOTE — Progress Notes (Addendum)
WEIGHT SUMMARY AND BIOMETRICS  Vitals Temp: 97.8 F (36.6 C) BP: 101/67 Pulse Rate: 76 SpO2: 97 %   Anthropometric Measurements Height: 5\' 1"  (1.549 m) Weight: 147 lb (66.7 kg) BMI (Calculated): 27.79 Weight at Last Visit: 146lb Weight Lost Since Last Visit: 0 Weight Gained Since Last Visit: 1lb Starting Weight: 170lb Total Weight Loss (lbs): 23 lb (10.4 kg)   Body Composition  Body Fat %: 37.5 % Fat Mass (lbs): 55.4 lbs Muscle Mass (lbs): 87.6 lbs Total Body Water (lbs): 63 lbs Visceral Fat Rating : 9   Other Clinical Data Fasting: no Labs: no Today's Visit #: 58 Starting Date: 07/07/21    Chief Complaint:   OBESITY Kim Clements is here to discuss her progress with her obesity treatment plan. She is on the the Category 1 Plan and states she is following her eating plan approximately 70-80 % of the time.  She states she is exercising NEAT Activities.   Interim History:  Reviewed Bioempedence results with pt: Muscle Mass: + 1 lb Adipose Mass: No Change  Kim Clements reports eating with her mother for dinner 1-2 times per week at [facility]. Dinner is meat protein, with 3 sides ( 2 vegetables with baked sweet potato). She is able to avoid the bread service and the after dinner desert.  Hydration-she estimates to drink approximately 60 oz water/day  2025 Health Goals: 1) Current eight 147 lbs.  Loss another 5-10 lbs over the next year. 2) Increase water intake, strive for at least 70-80 oz/day. 3) Walk 15-20 mins at least 2 x week.  Of note- Pt's husband at [facility] during OV.  Subjective:   1. Vitamin D deficiency Discussed Labs  Latest Reference Range & Units 11/08/22 10:32 04/05/23 10:20 07/27/23 11:24  Vitamin D, 25-Hydroxy 30.0 - 100.0 ng/mL 56.1 49.4 60.1   Level at goal. She is on daily OTC Vit D 3 2,000 international units (she takes 2 tabs of 1,000 international units daily).  2. Prediabetes Discussed Labs  Latest Reference Range &  Units 07/27/23 11:24  Glucose 70 - 99 mg/dL 78  Hemoglobin N8G 4.8 - 5.6 % 5.5  Est. average glucose Bld gHb Est-mCnc mg/dL 956  INSULIN 2.6 - 21.3 uIU/mL 4.3   CBG, A1c, and Insulin levels- all at goal! She is not on any blood glucose lowering medications  3. Mixed hyperlipidemia Discussed Labs Lipid Panel     Component Value Date/Time   CHOL 114 07/27/2023 1124   TRIG 61 07/27/2023 1124   HDL 64 07/27/2023 1124   CHOLHDL 1.8 07/27/2023 1124   CHOLHDL 4 12/25/2020 1130   VLDL 24.2 12/25/2020 1130   LDLCALC 37 07/27/2023 1124   LABVLDL 13 07/27/2023 1124    The ASCVD Risk score (Arnett DK, et al., 2019) failed to calculate for the following reasons:   The valid total cholesterol range is 130 to 320 mg/dL   08/65/7846 CMP- Electrolytes, kidney/liver enzymes- stable  Lipid panel at goal!  BP at goal at [facility] manages bi-weekly Repatha injections   Assessment/Plan:   1. Vitamin D deficiency Continue daily OTC Vit D 3 2,000 international units (she takes 2 tabs of 1,000 international units daily).  2. Prediabetes (Primary) Continue prescribed Cat 1 Meal plan and increase regular walking  3. Mixed hyperlipidemia Continue to limit sat fat and Repatha injections per Cards  4. Obesity, current BMI 27.6  Kim Clements is currently in the action stage of change. As such, her goal is to  continue with weight loss efforts. She has agreed to the Category 1 Plan.   Exercise goals: For substantial health benefits, adults should do at least 150 minutes (2 hours and 30 minutes) a week of moderate-intensity, or 75 minutes (1 hour and 15 minutes) a week of vigorous-intensity aerobic physical activity, or an equivalent combination of moderate- and vigorous-intensity aerobic activity. Aerobic activity should be performed in episodes of at least 10 minutes, and preferably, it should be spread throughout the week.  Behavioral modification strategies: increasing lean protein intake, decreasing  simple carbohydrates, increasing vegetables, increasing water intake, meal planning and cooking strategies, keeping healthy foods in the home, ways to avoid boredom eating, and planning for success.  Kim Clements has agreed to follow-up with our clinic in 4 weeks. She was informed of the importance of frequent follow-up visits to maximize her success with intensive lifestyle modifications for her multiple health conditions.   Objective:   Blood pressure 101/67, pulse 76, temperature 97.8 F (36.6 C), height 5\' 1"  (1.549 m), weight 147 lb (66.7 kg), SpO2 97%. Body mass index is 27.78 kg/m.  General: Cooperative, alert, well developed, in no acute distress. HEENT: Conjunctivae and lids unremarkable. Cardiovascular: Regular rhythm.  Lungs: Normal work of breathing. Neurologic: No focal deficits.   Lab Results  Component Value Date   CREATININE 0.80 07/27/2023   BUN 23 07/27/2023   NA 141 07/27/2023   K 4.4 07/27/2023   CL 103 07/27/2023   CO2 24 07/27/2023   Lab Results  Component Value Date   ALT 16 07/27/2023   AST 16 07/27/2023   ALKPHOS 51 07/27/2023   BILITOT 0.5 07/27/2023   Lab Results  Component Value Date   HGBA1C 5.5 07/27/2023   HGBA1C 5.4 04/05/2023   HGBA1C 5.5 11/08/2022   HGBA1C 5.7 (H) 06/17/2022   HGBA1C 5.3 03/04/2022   Lab Results  Component Value Date   INSULIN 4.3 07/27/2023   INSULIN 4.9 04/05/2023   INSULIN 4.7 11/08/2022   INSULIN 3.1 06/17/2022   INSULIN 4.0 03/04/2022   Lab Results  Component Value Date   TSH 2.05 12/25/2020   Lab Results  Component Value Date   CHOL 114 07/27/2023   HDL 64 07/27/2023   LDLCALC 37 07/27/2023   TRIG 61 07/27/2023   CHOLHDL 1.8 07/27/2023   Lab Results  Component Value Date   VD25OH 60.1 07/27/2023   VD25OH 49.4 04/05/2023   VD25OH 56.1 11/08/2022   Lab Results  Component Value Date   WBC 6.2 12/25/2020   HGB 14.3 12/25/2020   HCT 41.9 12/25/2020   MCV 88.3 12/25/2020   PLT 260.0 12/25/2020    No results found for: "IRON", "TIBC", "FERRITIN"  Attestation Statements:   Reviewed by clinician on day of visit: allergies, medications, problem list, medical history, surgical history, family history, social history, and previous encounter notes.  Due to seeing both her and her husband during same OV- extra time is needed to chart review, pre-chart, and manage care.   I have reviewed the above documentation for accuracy and completeness, and I agree with the above. -  Carron Jaggi d. Yared Susan, NP-C

## 2023-09-21 ENCOUNTER — Ambulatory Visit (INDEPENDENT_AMBULATORY_CARE_PROVIDER_SITE_OTHER): Payer: 59 | Admitting: Adult Health

## 2023-10-11 ENCOUNTER — Ambulatory Visit (INDEPENDENT_AMBULATORY_CARE_PROVIDER_SITE_OTHER): Payer: 59 | Admitting: Gastroenterology

## 2023-10-11 ENCOUNTER — Encounter: Payer: Self-pay | Admitting: Gastroenterology

## 2023-10-11 VITALS — BP 110/70 | HR 92 | Ht 61.0 in | Wt 152.0 lb

## 2023-10-11 DIAGNOSIS — E739 Lactose intolerance, unspecified: Secondary | ICD-10-CM

## 2023-10-11 DIAGNOSIS — K580 Irritable bowel syndrome with diarrhea: Secondary | ICD-10-CM

## 2023-10-11 DIAGNOSIS — K589 Irritable bowel syndrome without diarrhea: Secondary | ICD-10-CM | POA: Diagnosis not present

## 2023-10-11 MED ORDER — VIBERZI 75 MG PO TABS: 1.0000 | ORAL_TABLET | Freq: Two times a day (BID) | ORAL | 3 refills | Status: DC

## 2023-10-11 NOTE — Patient Instructions (Addendum)
 VISIT SUMMARY:  Kim Clements, you visited us  today to discuss the management of your irritable bowel syndrome (IBS) and lactose intolerance. We also reviewed your recent blood work, which shows you are no longer in the prediabetic range. Your weight has increased slightly since last year, and we discussed strategies to manage your symptoms and overall health.  YOUR PLAN:  -IRRITABLE BOWEL SYNDROME (IBS): IBS is a chronic condition that causes abdominal pain, bloating, and changes in bowel habits, often worsened by stress and anxiety. You should continue taking Viberzi  as needed, up to twice daily, and skip doses if you experience constipation. We provided a paper prescription for Viberzi . Please monitor your symptoms closely and adjust your medication accordingly.  -LACTOSE INTOLERANCE: Lactose intolerance means your body cannot digest lactose, leading to gastrointestinal discomfort. You should continue using lactase enzyme supplements as needed based on your lactose intake and try different brands to see which works best for you. Consider using lactose-free dairy alternatives to manage your symptoms.  -PRE-DIABETES (RESOLVED): You were previously diagnosed with pre-diabetes, but your recent blood tests show you are no longer in the prediabetic range. Continue with your current dietary and lifestyle modifications to maintain healthy blood glucose levels.  -GENERAL HEALTH MAINTENANCE: Continue with your current health practices. Your next colonoscopy is due in two years.  INSTRUCTIONS:  Please follow up in six months with the Physician Assistant (PA) and in one year with the Medical Doctor (MD). Set reminders in MyChart for these follow-up appointments.  I appreciate the  opportunity to care for you  Thank You   Kavitha Nandigam , MD

## 2023-10-11 NOTE — Progress Notes (Signed)
 Spring [place]    [id number]    19-Dec-1965  Primary Care Physician:Allwardt, Mardy HERO, PA-C  Referring Physician: Allwardt, Mardy HERO, PA-C [address] [place],  [place] [address]   Chief complaint:  IBS  Discussed the use of AI scribe software for clinical note transcription with the patient, who gave verbal consent to proceed.  History of Present Illness   Kim Clements is a 58 year old very pleasant female with irritable bowel syndrome who presents for management of bowel habits and lactose intolerance.  She experiences one to two bowel movements per day but occasionally has days without any bowel movements. She manages her irritable bowel syndrome with Viberzi , taking it once daily, and increases the dose to twice daily during periods of anxiety or stress, which exacerbate her symptoms. She sometimes skips doses if she anticipates constipation. No current diarrhea, stomach pains, blood in stool, or vomiting. Her weight has increased slightly since last year. She has urgency with bowel movements, particularly when not taking Viberzi , and is concerned about controlling this urgency, which can occur even with formed stools.  She has lactose intolerance and manages it by avoiding lactose-containing foods or using lactase enzyme supplements. She is cautious about consuming dairy and prefers lactose-free or dairy-free alternatives. Symptoms occur if she consumes lactose without taking the enzyme supplements. She is concerned about the social implications of her dietary restrictions, especially when dining with others.  She mentions being prediabetic but notes that her recent blood work indicates she is no longer in the prediabetic range. She attributes some weight gain to dietary choices, including consuming [employer] drinks, which she acknowledges contain sugar and calories.       Colonoscopy February 12, 2016 showed sigmoid diverticulosis internal  hemorrhoids otherwise normal exam   Colonoscopy Jan 09, 2010 by Dr. Rosalie at [facility] sigmoid diverticulosis otherwise normal exam   Colonoscopy October 20 2004 by Dr. [facility]al[facility].  Terminal ileum biopsies normal mucosa.  2 small polypoid appearing polyps were removed with biopsy forceps showed benign colonic mucosa.  Sigmoid diverticulosis     Outpatient Encounter Medications as of 10/11/2023  Medication Sig   AMBULATORY NON FORMULARY MEDICATION Allergy Shots 2 shots per week   amitriptyline  (ELAVIL ) 10 MG tablet TAKE 1 TABLET BY MOUTH EVERY DAY AT NIGHT   Cholecalciferol  (VITAMIN D3) 25 MCG (1000 UT) CAPS Take 4 capsules by mouth daily. 4000 IU/daily   EPINEPHrine 0.3 mg/0.3 mL IJ SOAJ injection SMARTSIG:IM As Directed PRN   Evolocumab  (REPATHA  SURECLICK) 140 MG/ML SOAJ INJECT 140 MG INTO THE SKIN EVERY 14 (FOURTEEN) DAYS.   fexofenadine (ALLEGRA) 180 MG tablet Take 180 mg by mouth daily.   itraconazole (SPORANOX) 10 MG/ML solution Take 10 mLs by mouth daily. Medication is not oral, it is a topical application.   NEXLETOL  180 MG TABS TAKE 1 TABLET BY MOUTH EVERY DAY   [DISCONTINUED] Eluxadoline  (VIBERZI ) 75 MG TABS Take 1 tablet (75 mg total) by mouth 2 (two) times daily.   Azelastine -Fluticasone  137-50 MCG/ACT SUSP Place 1 spray into the nose every 12 (twelve) hours. (Patient taking differently: Place 1 spray into the nose every 12 (twelve) hours. As needed.)   Eluxadoline  (VIBERZI ) 75 MG TABS Take 1 tablet (75 mg total) by mouth 2 (two) times daily.   No facility-administered encounter medications on file as of 10/11/2023.    Allergies as of 10/11/2023 - Review Complete 10/11/2023  Allergen Reaction  Noted   Erythromycin Rash 08/22/2015   Penicillins Hives 08/22/2015   Sulfa antibiotics Rash 08/22/2015    Past Medical History:  Diagnosis Date   Anal fissure    Anxiety    Arthritis    Asthma    Back pain    Colon polyps    Diverticulosis    Edema, lower extremity    GERD  (gastroesophageal reflux disease)    HLD (hyperlipidemia)    IBS (irritable bowel syndrome)    Joint pain    Lactose intolerance    Osteoarthritis    Vitamin D  deficiency     Past Surgical History:  Procedure Laterality Date   ABDOMINAL HYSTERECTOMY  2007   LAPAROSCOPY  1988   Laser. Indication: Endometriosis.   TONSILLECTOMY  1971    Family History  Problem Relation Age of Onset   Colon polyps Mother    Heart disease Mother    Rheum arthritis Mother    Osteoarthritis Mother    High blood pressure Mother    Anxiety disorder Mother    Colon polyps Father    Stroke Father    High blood pressure Father    High Cholesterol Father    Heart disease Father    Heart disease Maternal Grandmother    Heart disease Maternal Grandfather    Breast cancer Paternal Grandmother        all of paternal females   Prostate cancer Paternal Grandfather    Heart disease Paternal Grandfather    Breast cancer Paternal Aunt    Colon cancer Neg Hx     Social History   Socioeconomic History   Marital status: Married    Spouse name: Not on file   Number of children: 1   Years of education: Not on file   Highest education level: Not on file  Occupational History   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   Occupation: stay at home mom, retired  Tobacco Use   Smoking status: Never   Smokeless tobacco: Never  Vaping Use   Vaping status: Never Used  Substance and Sexual Activity   Alcohol use: No    Alcohol/week: 0.0 standard drinks of alcohol   Drug use: No   Sexual activity: Not on file  Other Topics Concern   Not on file  Social History Narrative   Not on file   Social Drivers of Health   Financial Resource Strain: Not on file  Food Insecurity: Not on file  Transportation Needs: Not on file  Physical Activity: Not on file  Stress: Not on file  Social Connections: Not on file  Intimate Partner Violence: Not on file      Review of systems: All other review of systems negative except  as mentioned in the HPI.   Physical Exam: Vitals:   10/11/23 1012  BP: 110/70  Pulse: 92   Body mass index is 28.72 kg/m. Gen:      No acute distress HEENT:  sclera anicteric Ext:    No edema Neuro: alert and oriented x 3 Psych: normal mood and affect  Data Reviewed:  Reviewed labs, radiology imaging, old records and pertinent past GI work up     Assessment and Plan    Irritable Bowel Syndrome (IBS) Chronic condition with abdominal pain, bloating, and altered bowel habits, exacerbated by stress and anxiety. Managed with Viberzi , adjusted based on symptoms. No recent diarrhea, abdominal pain, hematochezia, or emesis. Weight is stable. Emphasized medication adjustment to avoid constipation and the importance of monitoring  symptoms closely. - Continue Viberzi  as needed, up to twice daily - Skip doses if constipated - Provide paper prescription for Viberzi  - Follow-up in six months with PA and in one year with MD  Lactose Intolerance Inability to digest lactose, causing gastrointestinal symptoms. Discussed lactase enzyme supplements and trial of different brands to manage symptoms. Emphasized that lactose intolerance causes discomfort but no lasting damage. - Use lactase enzyme supplements as needed based on lactose intake - Try different brands of lactase supplements - Consider lactose-free dairy alternatives  Pre-Diabetes (resolved) Previously diagnosed with pre-diabetes, now out of pre-diabetic range per recent blood tests. Continues weight management with [facility]. - Continue current dietary and lifestyle modifications to maintain blood glucose levels  General Health Maintenance Encouraged continuation of current health practices. - Schedule next colonoscopy in two years  Follow-up - Follow-up in six months with PA - Follow-up in one year with MD - Set reminders in MyChart for follow-up appointments.       This visit required 40 minutes of patient care (this  includes precharting, chart review, review of results, face-to-face time used for counseling as well as treatment plan and follow-up. The patient was provided an opportunity to ask questions and all were answered. The patient agreed with the plan and demonstrated an understanding of the instructions.  K. Veena Tashe Purdon , MD    CC: Allwardt, Mardy HERO, PA-C

## 2023-10-19 ENCOUNTER — Ambulatory Visit (INDEPENDENT_AMBULATORY_CARE_PROVIDER_SITE_OTHER): Payer: 59 | Admitting: Adult Health

## 2023-10-19 ENCOUNTER — Encounter (INDEPENDENT_AMBULATORY_CARE_PROVIDER_SITE_OTHER): Payer: Self-pay | Admitting: Adult Health

## 2023-10-19 VITALS — BP 111/73 | HR 85 | Temp 98.0°F | Ht 61.0 in | Wt 149.0 lb

## 2023-10-19 DIAGNOSIS — E669 Obesity, unspecified: Secondary | ICD-10-CM

## 2023-10-19 DIAGNOSIS — E559 Vitamin D deficiency, unspecified: Secondary | ICD-10-CM | POA: Diagnosis not present

## 2023-10-19 DIAGNOSIS — K589 Irritable bowel syndrome without diarrhea: Secondary | ICD-10-CM

## 2023-10-19 DIAGNOSIS — K580 Irritable bowel syndrome with diarrhea: Secondary | ICD-10-CM

## 2023-10-19 DIAGNOSIS — Z6828 Body mass index (BMI) 28.0-28.9, adult: Secondary | ICD-10-CM

## 2023-10-19 DIAGNOSIS — F411 Generalized anxiety disorder: Secondary | ICD-10-CM

## 2023-10-19 DIAGNOSIS — R7303 Prediabetes: Secondary | ICD-10-CM

## 2023-10-19 DIAGNOSIS — E66811 Obesity, class 1: Secondary | ICD-10-CM

## 2023-10-19 NOTE — Progress Notes (Signed)
WEIGHT SUMMARY AND BIOMETRICS  Vitals Temp: 98 F (36.7 C) BP: 111/73 Pulse Rate: 85 SpO2: 99 %   Anthropometric Measurements Height: 5\' 1"  (1.549 m) Weight: 149 lb (67.6 kg) BMI (Calculated): 28.17 Weight at Last Visit: 147lb Weight Lost Since Last Visit: 0lb Weight Gained Since Last Visit: 2lb Starting Weight: 170lb Total Weight Loss (lbs): 21 lb (9.526 kg)   Body Composition  Body Fat %: 38.4 % Fat Mass (lbs): 57.2 lbs Muscle Mass (lbs): 87.2 lbs Total Body Water (lbs): 63.6 lbs Visceral Fat Rating : 9   Other Clinical Data Fasting: No Labs: No Today's Visit #: 34 Starting Date: 07/07/21    Chief Complaint:   OBESITY Kim Clements is here to discuss her progress with her obesity treatment plan.  She is on the the Category 1 Plan and states she is following her eating plan approximately 70 % of the time.  She states she is exercising Walking 30 minutes 1 times per week.   Interim History:  Last OV at [facility] was 08/24/2023 She was seen by her Podiatrist on 09/28/2023- continued on Sporanox and Amerigel.  Hygiene suggestions offered. She was seen by her Gastroenterologist on 10/11/2023  Reviewed Bioimpedance results with pt: Muscle Mass:-0.4 lb Adipose Mass:+ 1.8 lbs  Stress- they have two family dogs- rescue shelty's. Fredric Mare and [place]. Fredric Mare recently dx'd with incurable bladder cancer.  Exercise-she and her family have a stationary bike and treadmill in the home.  Hydration-she estimates to drink 60-70 oz water/day.  Subjective:   1. Irritable bowel syndrome with diarrhea GI OV Notes 10/11/2023 Assessment and Plan    Irritable Bowel Syndrome (IBS) Chronic condition with abdominal pain, bloating, and altered bowel habits, exacerbated by stress and anxiety. Managed with Viberzi, adjusted based on symptoms. No recent diarrhea, abdominal pain, hematochezia, or emesis. Weight is stable. Emphasized medication adjustment to avoid constipation and the  importance of monitoring symptoms closely. - Continue Viberzi as needed, up to twice daily - Skip doses if constipated - Provide paper prescription for Viberzi - Follow-up in six months with PA and in one year with MD   Lactose Intolerance Inability to digest lactose, causing gastrointestinal symptoms. Discussed lactase enzyme supplements and trial of different brands to manage symptoms. Emphasized that lactose intolerance causes discomfort but no lasting damage. - Use lactase enzyme supplements as needed based on lactose intake - Try different brands of lactase supplements - Consider lactose-free dairy alternatives   Pre-Diabetes (resolved) Previously diagnosed with pre-diabetes, now out of pre-diabetic range per recent blood tests. Continues weight management with [facility]. - Continue current dietary and lifestyle modifications to maintain blood glucose levels   General Health Maintenance Encouraged continuation of current health practices. - Schedule next colonoscopy in two years   Follow-up - Follow-up in six months with PA - Follow-up in one year with MD     2. Vitamin D deficiency  Latest Reference Range & Units 04/05/23 10:20 07/27/23 11:24  Vitamin D, 25-Hydroxy 30.0 - 100.0 ng/mL 49.4 60.1   Level stable on  Cholecalciferol (VITAMIN D3) 25 MCG (1000 UT) CAPS Take 4 capsules by mouth daily. 4000 IU/daily    3. Prediabetes Lab Results  Component Value Date   HGBA1C 5.5 07/27/2023   HGBA1C 5.4 04/05/2023   HGBA1C 5.5 11/08/2022    She is not currently, nor has she ever been on antidiabetic therapy  4. GAD (generalized anxiety disorder) She is on  amitriptyline (ELAVIL) 10 MG tablet  PCP manages low dose  tricyclic antidepressant   Assessment/Plan:   1. Irritable bowel syndrome with diarrhea Continue to avoid known food triggers  2. Vitamin D deficiency (Primary) Continue OTC Supplementation  3. Prediabetes Increase protein and limit sugar/simple  CHO Increase regular exercise  4. GAD (generalized anxiety disorder) Increase regular exercise  5. Obesity, current BMI 28.2  Kim Clements is currently in the action stage of change. As such, her goal is to continue with weight loss efforts. She has agreed to the Category 1 Plan.   Exercise goals: All adults should avoid inactivity. Some physical activity is better than none, and adults who participate in any amount of physical activity gain some health benefits. Adults should also include muscle-strengthening activities that involve all major muscle groups on 2 or more days a week.  Behavioral modification strategies: increasing lean protein intake, decreasing simple carbohydrates, increasing vegetables, increasing water intake, no skipping meals, meal planning and cooking strategies, keeping healthy foods in the home, ways to avoid boredom eating, better snacking choices, emotional eating strategies, and planning for success.  Cardiovascular Exercise 5-10 mins 2 x week  Kim Clements has agreed to follow-up with our clinic in 3-4 weeks. She was informed of the importance of frequent follow-up visits to maximize her success with intensive lifestyle modifications for her multiple health conditions.   Check Fasting Labs and IC at April [facility] OV.  Pt is aware to arrive 30 mins early and fasting the morning of 12/31/2023  Objective:   Blood pressure 111/73, pulse 85, temperature 98 F (36.7 C), height 5\' 1"  (1.549 m), weight 149 lb (67.6 kg), SpO2 99%. Body mass index is 28.15 kg/m.  General: Cooperative, alert, well developed, in no acute distress. HEENT: Conjunctivae and lids unremarkable. Cardiovascular: Regular rhythm.  Lungs: Normal work of breathing. Neurologic: No focal deficits.   Lab Results  Component Value Date   CREATININE 0.80 07/27/2023   BUN 23 07/27/2023   NA 141 07/27/2023   K 4.4 07/27/2023   CL 103 07/27/2023   CO2 24 07/27/2023   Lab Results  Component Value Date    ALT 16 07/27/2023   AST 16 07/27/2023   ALKPHOS 51 07/27/2023   BILITOT 0.5 07/27/2023   Lab Results  Component Value Date   HGBA1C 5.5 07/27/2023   HGBA1C 5.4 04/05/2023   HGBA1C 5.5 11/08/2022   HGBA1C 5.7 (H) 06/17/2022   HGBA1C 5.3 03/04/2022   Lab Results  Component Value Date   INSULIN 4.3 07/27/2023   INSULIN 4.9 04/05/2023   INSULIN 4.7 11/08/2022   INSULIN 3.1 06/17/2022   INSULIN 4.0 03/04/2022   Lab Results  Component Value Date   TSH 2.05 12/25/2020   Lab Results  Component Value Date   CHOL 114 07/27/2023   HDL 64 07/27/2023   LDLCALC 37 07/27/2023   TRIG 61 07/27/2023   CHOLHDL 1.8 07/27/2023   Lab Results  Component Value Date   VD25OH 60.1 07/27/2023   VD25OH 49.4 04/05/2023   VD25OH 56.1 11/08/2022   Lab Results  Component Value Date   WBC 6.2 12/25/2020   HGB 14.3 12/25/2020   HCT 41.9 12/25/2020   MCV 88.3 12/25/2020   PLT 260.0 12/25/2020   No results found for: "IRON", "TIBC", "FERRITIN"  Attestation Statements:   Reviewed by clinician on day of visit: allergies, medications, problem list, medical history, surgical history, family history, social history, and previous encounter notes.  Time spent on visit including pre-visit chart review and post-visit care and charting was 45 minutes.  Due to seeing both her and her husband during same OV- extra time is needed to chart review, pre-chart, and manage care.   I have reviewed the above documentation for accuracy and completeness, and I agree with the above. -  Dianne Bady d. Mehlani Blankenburg, NP-C

## 2023-11-09 ENCOUNTER — Ambulatory Visit (INDEPENDENT_AMBULATORY_CARE_PROVIDER_SITE_OTHER): Payer: 59 | Admitting: Adult Health

## 2023-11-09 ENCOUNTER — Encounter (INDEPENDENT_AMBULATORY_CARE_PROVIDER_SITE_OTHER): Payer: Self-pay | Admitting: Adult Health

## 2023-11-09 VITALS — BP 104/70 | HR 79 | Temp 98.6°F | Ht 61.0 in | Wt 148.0 lb

## 2023-11-09 DIAGNOSIS — E559 Vitamin D deficiency, unspecified: Secondary | ICD-10-CM

## 2023-11-09 DIAGNOSIS — Z0000 Encounter for general adult medical examination without abnormal findings: Secondary | ICD-10-CM

## 2023-11-09 DIAGNOSIS — R7303 Prediabetes: Secondary | ICD-10-CM | POA: Diagnosis not present

## 2023-11-09 DIAGNOSIS — E669 Obesity, unspecified: Secondary | ICD-10-CM

## 2023-11-09 DIAGNOSIS — E782 Mixed hyperlipidemia: Secondary | ICD-10-CM

## 2023-11-09 DIAGNOSIS — Z6828 Body mass index (BMI) 28.0-28.9, adult: Secondary | ICD-10-CM

## 2023-11-09 DIAGNOSIS — E66811 Obesity, class 1: Secondary | ICD-10-CM

## 2023-11-09 NOTE — Progress Notes (Signed)
 WEIGHT SUMMARY AND BIOMETRICS  Vitals Temp: 98.6 F (37 C) BP: 104/70 Pulse Rate: 79 SpO2: 100 %   Anthropometric Measurements Height: 5\' 1"  (1.549 m) Weight: 148 lb (67.1 kg) BMI (Calculated): 27.98 Weight at Last Visit: 149 lb Weight Lost Since Last Visit: 1 lb Weight Gained Since Last Visit: 0 Starting Weight: 170 lb Total Weight Loss (lbs): 20 lb (9.072 kg)   Body Composition  Body Fat %: 37.3 % Fat Mass (lbs): 55.4 lbs Muscle Mass (lbs): 88.2 lbs Total Body Water (lbs): 63.2 lbs Visceral Fat Rating : 9   Other Clinical Data Fasting: no Labs: no Today's Visit #: 35 Starting Date: 07/07/21    Chief Complaint:   OBESITY Kim Clements is here to discuss her progress with her obesity treatment plan.  She is on the the Category 1 Plan and states she is following her eating plan approximately 75-80 % of the time.  She states she is exercising Walking 20 minutes 2 times per week.   Interim History:  Reviewed Bioimpedance results with pt: Muscle Mass: + 1 lb Adipose Mass: -1.8 lbs  Stress- Kim Clements endorses increased and subsequent anxiety r/t -Her brother has experienced recent "cardiac concerns" -Her mother was recently ill with Norovirus -Kim Clements recently dx'd with incurable bladder cancer. Kim Clements is one of their family dogs.  Exercise-Walking 2-3 times per week  Hydration-she estimates to drink 60-70 oz water/day  Of note- Her husband at [facility] during OV [place] prefer to be seen together at [facility]  Subjective:   1. Healthcare maintenance Check Fasting Labs and IC at April [facility] OV. Pt is aware to arrive 30 mins early and fasting the morning of 12/31/2023   2. Vitamin D deficiency  Latest Reference Range & Units 07/27/23 11:24  Vitamin D, 25-Hydroxy 30.0 - 100.0 ng/mL 60.1   She is on daily OTC Vit D 3 1,000 international units, 4 caps per day = 4,000 international units    3. Prediabetes Lab Results  Component Value Date   HGBA1C 5.5  07/27/2023   HGBA1C 5.4 04/05/2023   HGBA1C 5.5 11/08/2022    She endorse "stress eating" and will often choose higher CHO foods.  4. Mixed hyperlipidemia Lipid Panel     Component Value Date/Time   CHOL 114 07/27/2023 1124   TRIG 61 07/27/2023 1124   HDL 64 07/27/2023 1124   CHOLHDL 1.8 07/27/2023 1124   CHOLHDL 4 12/25/2020 1130   VLDL 24.2 12/25/2020 1130   LDLCALC 37 07/27/2023 1124   LABVLDL 13 07/27/2023 1124    Cards manages NEXLETOL 180 MG TABS  Evolocumab (REPATHA SURECLICK) 140 MG/ML SOAJ   Assessment/Plan:   1. Healthcare maintenance Check Labs at next OV Increase walking Limit sugary drinks  2. Vitamin D deficiency (Primary) Check Labs at next OV  3. Prediabetes Increase protein and limit simple CHO/sugar intake Increase walking Limit sugary drinks  4. Mixed hyperlipidemia Continue NEXLETOL 180 MG TABS  Evolocumab (REPATHA SURECLICK) 140 MG/ML SOAJ   5. Obesity, current BMI 28.0  Kim Clements is currently in the action stage of change. As such, her goal is to continue with weight loss efforts. She has agreed to the Category 1 Plan.   Exercise goals: All adults should avoid inactivity. Some physical activity is better than none, and adults who participate in any amount of physical activity gain some health benefits. Adults should also include muscle-strengthening activities that involve all major muscle groups on 2 or more days a week.  Behavioral modification strategies: increasing lean protein intake, decreasing simple carbohydrates, increasing vegetables, increasing water intake, no skipping meals, meal planning and cooking strategies, keeping healthy foods in the home, ways to avoid boredom eating, ways to avoid night time snacking, better snacking choices, emotional eating strategies, planning for success, and decreasing junk food.  Kim Clements has agreed to follow-up with our clinic in 4 weeks. She was informed of the importance of frequent follow-up  visits to maximize her success with intensive lifestyle modifications for her multiple health conditions.   Check Fasting Labs and IC at April [facility] OV. Pt is aware to arrive 30 mins early and fasting the morning of 12/31/2023   Objective:   Blood pressure 104/70, pulse 79, temperature 98.6 F (37 C), height 5\' 1"  (1.549 m), weight 148 lb (67.1 kg), SpO2 100%. Body mass index is 27.96 kg/m.  General: Cooperative, alert, well developed, in no acute distress. HEENT: Conjunctivae and lids unremarkable. Cardiovascular: Regular rhythm.  Lungs: Normal work of breathing. Neurologic: No focal deficits.   Lab Results  Component Value Date   CREATININE 0.80 07/27/2023   BUN 23 07/27/2023   NA 141 07/27/2023   K 4.4 07/27/2023   CL 103 07/27/2023   CO2 24 07/27/2023   Lab Results  Component Value Date   ALT 16 07/27/2023   AST 16 07/27/2023   ALKPHOS 51 07/27/2023   BILITOT 0.5 07/27/2023   Lab Results  Component Value Date   HGBA1C 5.5 07/27/2023   HGBA1C 5.4 04/05/2023   HGBA1C 5.5 11/08/2022   HGBA1C 5.7 (H) 06/17/2022   HGBA1C 5.3 03/04/2022   Lab Results  Component Value Date   INSULIN 4.3 07/27/2023   INSULIN 4.9 04/05/2023   INSULIN 4.7 11/08/2022   INSULIN 3.1 06/17/2022   INSULIN 4.0 03/04/2022   Lab Results  Component Value Date   TSH 2.05 12/25/2020   Lab Results  Component Value Date   CHOL 114 07/27/2023   HDL 64 07/27/2023   LDLCALC 37 07/27/2023   TRIG 61 07/27/2023   CHOLHDL 1.8 07/27/2023   Lab Results  Component Value Date   VD25OH 60.1 07/27/2023   VD25OH 49.4 04/05/2023   VD25OH 56.1 11/08/2022   Lab Results  Component Value Date   WBC 6.2 12/25/2020   HGB 14.3 12/25/2020   HCT 41.9 12/25/2020   MCV 88.3 12/25/2020   PLT 260.0 12/25/2020   No results found for: "IRON", "TIBC", "FERRITIN"  Attestation Statements:   Reviewed by clinician on day of visit: allergies, medications, problem list, medical history, surgical history, family  history, social history, and previous encounter notes.  Time spent on visit including pre-visit chart review and post-visit care and charting was 48 minutes.   Due to seeing both her and her husband during same OV- extra time is needed to chart review, pre-chart, and manage care.   I have reviewed the above documentation for accuracy and completeness, and I agree with the above. -  Kim Clements d. Kim Romagnoli, NP-C

## 2023-11-14 ENCOUNTER — Other Ambulatory Visit: Payer: Self-pay | Admitting: Physician Assistant

## 2023-11-14 DIAGNOSIS — G8929 Other chronic pain: Secondary | ICD-10-CM

## 2023-11-24 ENCOUNTER — Encounter: Payer: Self-pay | Admitting: Internal Medicine

## 2023-11-24 DIAGNOSIS — E785 Hyperlipidemia, unspecified: Secondary | ICD-10-CM

## 2023-11-30 ENCOUNTER — Encounter (INDEPENDENT_AMBULATORY_CARE_PROVIDER_SITE_OTHER): Payer: Self-pay | Admitting: Adult Health

## 2023-11-30 ENCOUNTER — Ambulatory Visit (INDEPENDENT_AMBULATORY_CARE_PROVIDER_SITE_OTHER): Payer: 59 | Admitting: Adult Health

## 2023-11-30 VITALS — BP 104/66 | HR 75 | Temp 97.9°F | Ht 61.0 in | Wt 148.0 lb

## 2023-11-30 DIAGNOSIS — R0602 Shortness of breath: Secondary | ICD-10-CM | POA: Diagnosis not present

## 2023-11-30 DIAGNOSIS — R7303 Prediabetes: Secondary | ICD-10-CM | POA: Diagnosis not present

## 2023-11-30 DIAGNOSIS — F411 Generalized anxiety disorder: Secondary | ICD-10-CM | POA: Diagnosis not present

## 2023-11-30 DIAGNOSIS — E559 Vitamin D deficiency, unspecified: Secondary | ICD-10-CM | POA: Diagnosis not present

## 2023-11-30 DIAGNOSIS — E66811 Obesity, class 1: Secondary | ICD-10-CM

## 2023-11-30 DIAGNOSIS — E782 Mixed hyperlipidemia: Secondary | ICD-10-CM | POA: Diagnosis not present

## 2023-11-30 DIAGNOSIS — E669 Obesity, unspecified: Secondary | ICD-10-CM

## 2023-11-30 DIAGNOSIS — Z6828 Body mass index (BMI) 28.0-28.9, adult: Secondary | ICD-10-CM

## 2023-11-30 NOTE — Progress Notes (Signed)
 WEIGHT SUMMARY AND BIOMETRICS  Vitals Temp: 97.9 F (36.6 C) BP: 104/66 Pulse Rate: 75 SpO2: 97 %   Anthropometric Measurements Height: 5\' 1"  (1.549 m) Weight: 148 lb (67.1 kg) BMI (Calculated): 27.98 Weight at Last Visit: 148 lb Weight Lost Since Last Visit: 0 Weight Gained Since Last Visit: 0 Starting Weight: 170 lb Total Weight Loss (lbs): 22 lb (9.979 kg)   Body Composition  Body Fat %: 38.1 % Fat Mass (lbs): 56.4 lbs Muscle Mass (lbs): 87 lbs Total Body Water (lbs): 63.2 lbs Visceral Fat Rating : 9   Other Clinical Data Fasting: yes Labs: yes Today's Visit #: 41 Starting Date: 07/07/21    Chief Complaint:   OBESITY Kim Clements is here to discuss her progress with her obesity treatment plan.  She is on the the Category 1 Plan and states she is following her eating plan approximately 70 % of the time.  She states she is exercising Walking 20 minutes 2 times per week.   Interim History:  Reviewed Bioimpedance Results with pt: Muscle Mass: 1.2 lbs Adipose Mass: + 1 lb  Exercise-she and her husband has resumed weekly outdoor walking.  Of Note- Her husband is at [facility] during OV  Subjective:   1. SOBOE (shortness of breath on exertion) She endorses dyspnea with extreme exertion, denies CP   12/13/22 10:00  RMR 1296    11/30/23 10:00  RMR 1310   RMR increased and faster than expected  2. Vitamin D deficiency  She is on OTC Vit D3 1,000 international units  She takes 4 caps/day= 4,000 international units/day She endorses stable energy levels  3. Prediabetes Lab Results  Component Value Date   HGBA1C 5.5 07/27/2023   HGBA1C 5.4 04/05/2023   HGBA1C 5.5 11/08/2022    She endorses stable appetite She is no currently on any antidiabetic therapy at present  4. HLD-Mixed Cards manages  EPINEPHrine 0.3 mg/0.3 mL IJ SOAJ injection  NEXLETOL 180 MG TABS  Evolocumab (REPATHA SURECLICK) 140 MG/ML SOAJ   5. GAD (generalized anxiety  disorder) Ms. [place] stable mood She denies increased anxiety sx's She has not been seen by her therapist in 2-3 years She is taking evening Elavil 10mg  She denies morning sedation  Assessment/Plan:   1. SOBOE (shortness of breath on exertion) (Primary) Check IC Continue Cat 1 Meal Plan for weight loss To maintain weight, consume 1300-1350 cal/day, 100g+ protein/day  2. Vitamin D deficiency Check Labs - Vit D Level  3. Prediabetes Check Labs - A1c -Insulin Level - B12 Level  4. HLD-Mixed Check Labs - CMP -Lipid Panel F/u with Cards/Dr. Rennis Golden June 2025  5. GAD (generalized anxiety disorder) Check Labs - TSH and Free T4  6. Obesity, current BMI 28.0  [name] is currently in the action stage of change. As such, her goal is to continue with weight loss efforts. She has agreed to the Category 1 Plan.   Exercise goals: For substantial health benefits, adults should do at least 150 minutes (2 hours and 30 minutes) a week of moderate-intensity, or 75 minutes (1 hour and 15 minutes) a week of vigorous-intensity aerobic physical activity, or an equivalent combination of moderate- and vigorous-intensity aerobic activity. Aerobic activity should be performed in episodes of at least 10 minutes, and preferably, it should be spread throughout the week.  Behavioral modification strategies: increasing lean protein intake, decreasing simple carbohydrates, increasing vegetables, increasing water intake, decreasing liquid calories, meal planning and cooking strategies, keeping healthy foods in the  home, ways to avoid boredom eating, and planning for success.  Verne has agreed to follow-up with our clinic in 4 weeks. She was informed of the importance of frequent follow-up visits to maximize her success with intensive lifestyle modifications for her multiple health conditions.   Ciclaly was informed we would discuss her lab results at her next visit unless there is a critical issue  that needs to be addressed sooner. Adonis agreed to keep her next visit at the agreed upon time to discuss these results.  Objective:   Blood pressure 104/66, pulse 75, temperature 97.9 F (36.6 C), height 5\' 1"  (1.549 m), weight 148 lb (67.1 kg), SpO2 97%. Body mass index is 27.96 kg/m.  General: Cooperative, alert, well developed, in no acute distress. HEENT: Conjunctivae and lids unremarkable. Cardiovascular: Regular rhythm.  Lungs: Normal work of breathing. Neurologic: No focal deficits.   Lab Results  Component Value Date   CREATININE 0.80 07/27/2023   BUN 23 07/27/2023   NA 141 07/27/2023   K 4.4 07/27/2023   CL 103 07/27/2023   CO2 24 07/27/2023   Lab Results  Component Value Date   ALT 16 07/27/2023   AST 16 07/27/2023   ALKPHOS 51 07/27/2023   BILITOT 0.5 07/27/2023   Lab Results  Component Value Date   HGBA1C 5.5 07/27/2023   HGBA1C 5.4 04/05/2023   HGBA1C 5.5 11/08/2022   HGBA1C 5.7 (H) 06/17/2022   HGBA1C 5.3 03/04/2022   Lab Results  Component Value Date   INSULIN 4.3 07/27/2023   INSULIN 4.9 04/05/2023   INSULIN 4.7 11/08/2022   INSULIN 3.1 06/17/2022   INSULIN 4.0 03/04/2022   Lab Results  Component Value Date   TSH 2.05 12/25/2020   Lab Results  Component Value Date   CHOL 114 07/27/2023   HDL 64 07/27/2023   LDLCALC 37 07/27/2023   TRIG 61 07/27/2023   CHOLHDL 1.8 07/27/2023   Lab Results  Component Value Date   VD25OH 60.1 07/27/2023   VD25OH 49.4 04/05/2023   VD25OH 56.1 11/08/2022   Lab Results  Component Value Date   WBC 6.2 12/25/2020   HGB 14.3 12/25/2020   HCT 41.9 12/25/2020   MCV 88.3 12/25/2020   PLT 260.0 12/25/2020   No results found for: "IRON", "TIBC", "FERRITIN"  Attestation Statements:   Reviewed by clinician on day of visit: allergies, medications, problem list, medical history, surgical history, family history, social history, and previous encounter notes.  Due to seeing both her and her husband  during same OV- extra time is needed to chart review, pre-chart, and manage care.   I have reviewed the above documentation for accuracy and completeness, and I agree with the above. - Turrell Severt d. Ebba Goll, NP-C

## 2023-12-01 LAB — LIPID PANEL
Chol/HDL Ratio: 1.7 ratio (ref 0.0–4.4)
Cholesterol, Total: 103 mg/dL (ref 100–199)
HDL: 59 mg/dL (ref >39)
LDL Chol Calc (NIH): 30 mg/dL (ref 0–99)
Triglycerides: 66 mg/dL (ref 0–149)
VLDL Cholesterol Cal: 14 mg/dL (ref 5–40)

## 2023-12-01 LAB — VITAMIN D 25 HYDROXY (VIT D DEFICIENCY, FRACTURES): Vit D, 25-Hydroxy: 64.6 ng/mL (ref 30.0–100.0)

## 2023-12-01 LAB — COMPREHENSIVE METABOLIC PANEL WITH GFR
ALT: 21 IU/L (ref 0–32)
AST: 18 IU/L (ref 0–40)
Albumin: 4.5 g/dL (ref 3.8–4.9)
Alkaline Phosphatase: 49 IU/L (ref 44–121)
BUN/Creatinine Ratio: 20 (ref 9–23)
BUN: 16 mg/dL (ref 6–24)
Bilirubin Total: 0.5 mg/dL (ref 0.0–1.2)
CO2: 25 mmol/L (ref 20–29)
Calcium: 9.6 mg/dL (ref 8.7–10.2)
Chloride: 101 mmol/L (ref 96–106)
Creatinine, Ser: 0.8 mg/dL (ref 0.57–1.00)
Globulin, Total: 2 g/dL (ref 1.5–4.5)
Glucose: 80 mg/dL (ref 70–99)
Potassium: 4.4 mmol/L (ref 3.5–5.2)
Sodium: 140 mmol/L (ref 134–144)
Total Protein: 6.5 g/dL (ref 6.0–8.5)
eGFR: 85 mL/min/{1.73_m2} (ref >59)

## 2023-12-01 LAB — TSH+FREE T4
Free T4: 1.19 ng/dL (ref 0.82–1.77)
TSH: 1.61 u[IU]/mL (ref 0.450–4.500)

## 2023-12-01 LAB — VITAMIN B12: Vitamin B-12: 333 pg/mL (ref 232–1245)

## 2023-12-01 LAB — HEMOGLOBIN A1C
Est. average glucose Bld gHb Est-mCnc: 114 mg/dL
Hgb A1c MFr Bld: 5.6 % (ref 4.8–5.6)

## 2023-12-01 LAB — INSULIN, RANDOM: INSULIN: 4.7 u[IU]/mL (ref 2.6–24.9)

## 2023-12-20 ENCOUNTER — Encounter (INDEPENDENT_AMBULATORY_CARE_PROVIDER_SITE_OTHER): Payer: Self-pay | Admitting: Adult Health

## 2023-12-20 ENCOUNTER — Ambulatory Visit (INDEPENDENT_AMBULATORY_CARE_PROVIDER_SITE_OTHER): Admitting: Adult Health

## 2023-12-20 VITALS — BP 105/69 | HR 77 | Temp 97.7°F | Ht 61.0 in | Wt 149.0 lb

## 2023-12-20 DIAGNOSIS — Z6828 Body mass index (BMI) 28.0-28.9, adult: Secondary | ICD-10-CM

## 2023-12-20 DIAGNOSIS — F411 Generalized anxiety disorder: Secondary | ICD-10-CM | POA: Diagnosis not present

## 2023-12-20 DIAGNOSIS — E669 Obesity, unspecified: Secondary | ICD-10-CM

## 2023-12-20 DIAGNOSIS — E782 Mixed hyperlipidemia: Secondary | ICD-10-CM

## 2023-12-20 DIAGNOSIS — R7303 Prediabetes: Secondary | ICD-10-CM | POA: Diagnosis not present

## 2023-12-20 DIAGNOSIS — E559 Vitamin D deficiency, unspecified: Secondary | ICD-10-CM

## 2023-12-20 DIAGNOSIS — E66811 Obesity, class 1: Secondary | ICD-10-CM

## 2023-12-20 NOTE — Progress Notes (Signed)
 WEIGHT SUMMARY AND BIOMETRICS  Vitals Temp: 97.7 F (36.5 C) BP: 105/69 Pulse Rate: 77 SpO2: 100 %   Anthropometric Measurements Height: 5\' 1"  (1.549 m) Weight: 149 lb (67.6 kg) BMI (Calculated): 28.17 Weight at Last Visit: 148 LB Weight Lost Since Last Visit: 0 Weight Gained Since Last Visit: 1 lb Starting Weight: 170 LB Total Weight Loss (lbs): 21 lb (9.526 kg)   Body Composition  Body Fat %: 38.6 % Fat Mass (lbs): 57.8 lbs Muscle Mass (lbs): 87.2 lbs Total Body Water (lbs): 65.2 lbs Visceral Fat Rating : 9   Other Clinical Data RMR: 1310 Fasting: NO Labs: NO Today's Visit #: 37 Starting Date: 07/07/21    Chief Complaint:   OBESITY Kim Clements is here to discuss her progress with her obesity treatment plan.  She is on the the Category 1 Plan and states she is following her eating plan approximately 75-80 % of the time.  She states she is exercising: NEAT Activities    Interim History:  Reviewed Bioempedence results with pt: Muscle Mass: +0.2 lb Adipose Mass: +1.4 lbs Visceral Adipose Rating 9, at goal Highest Visceral Rating was recorded as 11  She has celebrated a few family birthdays and travelled to the beach in the last 2-3 weeks  Of Note- Her husband at [facility] during OV  Subjective:   1. Mixed hyperlipidemia Discussed Labs Lipid Panel     Component Value Date/Time   CHOL 103 11/30/2023 1215   TRIG 66 11/30/2023 1215   HDL 59 11/30/2023 1215   CHOLHDL 1.7 11/30/2023 1215   CHOLHDL 4 12/25/2020 1130   VLDL 24.2 12/25/2020 1130   LDLCALC 30 11/30/2023 1215   LABVLDL 14 11/30/2023 1215   The ASCVD Risk score (Arnett DK, et al., 2019) failed to calculate for the following reasons:   The valid total cholesterol range is 130 to 320 mg/dL   Lipid panel stable and at goal  Cards manages EPINEPHrine 0.3 mg/0.3 mL IJ SOAJ injection  NEXLETOL 180 MG TABS  Evolocumab (REPATHA SURECLICK) 140 MG/ML SOAJ   2. Vitamin D deficiency Discussed  Labs  Latest Reference Range & Units 11/30/23 12:15  Vitamin D, 25-Hydroxy 30.0 - 100.0 ng/mL 64.6   Vit D Level at goal She is taking OTC Vit D3 1,000 international units = 4 caps = 4,000 international units /daily  3. Prediabetes Discussed Labs  Latest Reference Range & Units 11/30/23 12:15  Glucose 70 - 99 mg/dL 80  Hemoglobin O1H 4.8 - 5.6 % 5.6  Est. average glucose Bld gHb Est-mCnc mg/dL 086  INSULIN 2.6 - 57.8 uIU/mL 4.7   CBG/A1c/Insulin Level- all are within range She is not on any antidiabetic medications  4. GAD (generalized anxiety disorder) Discussed Labs  Latest Reference Range & Units 11/30/23 12:16  TSH 0.450 - 4.500 uIU/mL 1.610  T4,Free(Direct) 0.82 - 1.77 ng/dL 4.69   Thyroid panel stable PCP manages low dose tricyclic antidepressant  - Elavil 10mg - used for anxiety and insomnia. Per pt, manufactuer changed that was providing her Elavil and she experienced GI upset with the new tablet. She stopped nightly Elavil 10mg  > 2 weeks She denise withdrawal sx's She reports stable mood She has not informed PCP of this change  Assessment/Plan:   1. Mixed hyperlipidemia Continue healthy eating and INCREASE regular exercise  2. Vitamin D deficiency (Primary) Reduce to OTC VIt D3 1,000 international units = 2 tabs= 2,000 international units /daily Start OTC MVI one tab/day  3. Prediabetes Continue  healthy eating and INCREASE regular exercise  4. GAD (generalized anxiety disorder) Continue healthy eating and INCREASE regular exercise F/u with PCP about med mgt  5. Obesity, current BMI 28.3  Imya is currently in the action stage of change. As such, her goal is to continue with weight loss efforts. She has agreed to the Category 1 Plan.   Exercise goals: All adults should avoid inactivity. Some physical activity is better than none, and adults who participate in any amount of physical activity gain some health benefits. Adults should also include  muscle-strengthening activities that involve all major muscle groups on 2 or more days a week. Strive to exercise at least 2 x week  Behavioral modification strategies: increasing lean protein intake, decreasing simple carbohydrates, increasing vegetables, increasing water intake, no skipping meals, meal planning and cooking strategies, keeping healthy foods in the home, better snacking choices, planning for success, and decreasing junk food.  Thelda has agreed to follow-up with our clinic in 4 weeks. She was informed of the importance of frequent follow-up visits to maximize her success with intensive lifestyle modifications for her multiple health conditions.   Objective:   Blood pressure 105/69, pulse 77, temperature 97.7 F (36.5 C), height 5\' 1"  (1.549 m), weight 149 lb (67.6 kg), SpO2 100%. Body mass index is 28.15 kg/m.  General: Cooperative, alert, well developed, in no acute distress. HEENT: Conjunctivae and lids unremarkable. Cardiovascular: Regular rhythm.  Lungs: Normal work of breathing. Neurologic: No focal deficits.   Lab Results  Component Value Date   CREATININE 0.80 11/30/2023   BUN 16 11/30/2023   NA 140 11/30/2023   K 4.4 11/30/2023   CL 101 11/30/2023   CO2 25 11/30/2023   Lab Results  Component Value Date   ALT 21 11/30/2023   AST 18 11/30/2023   ALKPHOS 49 11/30/2023   BILITOT 0.5 11/30/2023   Lab Results  Component Value Date   HGBA1C 5.6 11/30/2023   HGBA1C 5.5 07/27/2023   HGBA1C 5.4 04/05/2023   HGBA1C 5.5 11/08/2022   HGBA1C 5.7 (H) 06/17/2022   Lab Results  Component Value Date   INSULIN 4.7 11/30/2023   INSULIN 4.3 07/27/2023   INSULIN 4.9 04/05/2023   INSULIN 4.7 11/08/2022   INSULIN 3.1 06/17/2022   Lab Results  Component Value Date   TSH 1.610 11/30/2023   Lab Results  Component Value Date   CHOL 103 11/30/2023   HDL 59 11/30/2023   LDLCALC 30 11/30/2023   TRIG 66 11/30/2023   CHOLHDL 1.7 11/30/2023   Lab Results   Component Value Date   VD25OH 64.6 11/30/2023   VD25OH 60.1 07/27/2023   VD25OH 49.4 04/05/2023   Lab Results  Component Value Date   WBC 6.2 12/25/2020   HGB 14.3 12/25/2020   HCT 41.9 12/25/2020   MCV 88.3 12/25/2020   PLT 260.0 12/25/2020   No results found for: "IRON", "TIBC", "FERRITIN"  Attestation Statements:   Reviewed by clinician on day of visit: allergies, medications, problem list, medical history, surgical history, family history, social history, and previous encounter notes.  Due to seeing both her and her husband during same OV- extra time is needed to chart review, pre-chart, and manage care   I have reviewed the above documentation for accuracy and completeness, and I agree with the above. -  Nahzir Pohle d. Alyana Kreiter, NP-C

## 2023-12-27 ENCOUNTER — Telehealth: Payer: Self-pay | Admitting: Internal Medicine

## 2023-12-27 ENCOUNTER — Encounter: Payer: Self-pay | Admitting: Internal Medicine

## 2023-12-27 NOTE — Telephone Encounter (Signed)
 Pt c/o medication issue:  1. Name of Medication:  Evolocumab  (REPATHA  SURECLICK) 140 MG/ML SOAJ    2. How are you currently taking this medication (dosage and times per day)?  As prescribed  3. Are you having a reaction (difficulty breathing--STAT)?   4. What is your medication issue?   Patient says she's developing a reaction to Repatha . She never had any issues previously, but her thigh is itching and red around where she injected her medication. Developed about 2-3 days after taking medication. She says it happened the last time she injected it in her stomach, but she didn't think anything of it.

## 2023-12-27 NOTE — Telephone Encounter (Signed)
 Patient identification verified by 2 forms. Hilton Lucky, RN    Called and spoke to patient  Patient states:   -has been on repatha  for over a year   -last 2 injection she has developed reaction at the site   -she develops redness and itching   -the symptoms go away after couple of days   -she cleans area with alcohol prior to injecting  Informed patient message sent to pharmacy for assistance  Patient verbalized understanding, no questions at this time

## 2024-01-10 ENCOUNTER — Other Ambulatory Visit

## 2024-01-11 ENCOUNTER — Ambulatory Visit (INDEPENDENT_AMBULATORY_CARE_PROVIDER_SITE_OTHER): Admitting: Adult Health

## 2024-01-11 ENCOUNTER — Encounter (INDEPENDENT_AMBULATORY_CARE_PROVIDER_SITE_OTHER): Payer: Self-pay | Admitting: Adult Health

## 2024-01-11 VITALS — BP 98/60 | HR 70 | Temp 98.0°F | Ht 61.0 in | Wt 149.0 lb

## 2024-01-11 DIAGNOSIS — R7303 Prediabetes: Secondary | ICD-10-CM

## 2024-01-11 DIAGNOSIS — Z6832 Body mass index (BMI) 32.0-32.9, adult: Secondary | ICD-10-CM

## 2024-01-11 DIAGNOSIS — E559 Vitamin D deficiency, unspecified: Secondary | ICD-10-CM

## 2024-01-11 DIAGNOSIS — E669 Obesity, unspecified: Secondary | ICD-10-CM | POA: Diagnosis not present

## 2024-01-11 DIAGNOSIS — Z6828 Body mass index (BMI) 28.0-28.9, adult: Secondary | ICD-10-CM

## 2024-01-11 DIAGNOSIS — F411 Generalized anxiety disorder: Secondary | ICD-10-CM | POA: Diagnosis not present

## 2024-01-11 NOTE — Progress Notes (Signed)
 WEIGHT SUMMARY AND BIOMETRICS  Vitals Temp: 98 F (36.7 C) BP: 98/60 Pulse Rate: 70 SpO2: 99 %   Anthropometric Measurements Height: 5\' 1"  (1.549 m) Weight: 149 lb (67.6 kg) BMI (Calculated): 28.17 Weight at Last Visit: 149 lb Weight Lost Since Last Visit: 0 Weight Gained Since Last Visit: 0 Starting Weight: 170 lb Total Weight Loss (lbs): 21 lb (9.526 kg)   Body Composition  Body Fat %: 38.9 % Fat Mass (lbs): 58.2 lbs Muscle Mass (lbs): 86.8 lbs Total Body Water (lbs): 65 lbs Visceral Fat Rating : 9   Other Clinical Data RMR: 1310 Fasting: no Labs: no Today's Visit #: 38 Starting Date: 07/07/21    Chief Complaint:   OBESITY Kim Clements is here to discuss her progress with her obesity treatment plan.  She is on the the Category 1 Plan and states she is following her eating plan approximately 75-80 % of the time.  She states she is exercising Walking and House Work 20-25/daily minutes 2/daily times per week.  Interim History:  Reviewed Bioimpedance Results with pt: Muscle Mass: -0.4 lb Adipose Mass: +0.4 lb  Weight loss has stalled the last several months, however her BP is well controlled and A1c/Insulin  levels have normalized.  Husband at [facility] during OV  Subjective:   1. Prediabetes Lab Results  Component Value Date   HGBA1C 5.6 11/30/2023   HGBA1C 5.5 07/27/2023   HGBA1C 5.4 04/05/2023    Blood Glucose levels have been improving > 12 months! She is not on any antidiabetic therapy  2. Vitamin D  deficiency  Latest Reference Range & Units 04/05/23 10:20 07/27/23 11:24 11/30/23 12:15  Vitamin D , 25-Hydroxy 30.0 - 100.0 ng/mL 49.4 60.1 64.6   She is on OTC Vit D Supplementation She endorses stable energy levels  3. GAD (generalized anxiety disorder) Kim Clements stable mood Denies increase in anxiety levels She was previously on- Elavil  10mg - used for anxiety and insomnia. Kim Clements stopped low dose TCA > 4 weeks ago MAR updated  today  Assessment/Plan:   1. Prediabetes Increase protein intake and limit sugar/simple CHO Continue to increase regular walking  2. Vitamin D  deficiency (Primary) Continue OTC Supplementation  3. GAD (generalized anxiety disorder) Continue regular walking  4. Obesity, current BMI 28.3  Kim Clements is currently in the action stage of change. As such, her goal is to continue with weight loss efforts. She has agreed to the Category 1 Plan.   Calorie Range and Protein Goals provided for each meal.  Exercise goals: For substantial health benefits, adults should do at least 150 minutes (2 hours and 30 minutes) a week of moderate-intensity, or 75 minutes (1 hour and 15 minutes) a week of vigorous-intensity aerobic physical activity, or an equivalent combination of moderate- and vigorous-intensity aerobic activity. Aerobic activity should be performed in episodes of at least 10 minutes, and preferably, it should be spread throughout the week.  Behavioral modification strategies: increasing lean protein intake, decreasing simple carbohydrates, increasing vegetables, increasing water intake, no skipping meals, meal planning and cooking strategies, keeping healthy foods in the home, ways to avoid boredom eating, and planning for success.  Kim Clements has agreed to follow-up with our clinic in 4 weeks. She was informed of the importance of frequent follow-up visits to maximize her success with intensive lifestyle modifications for her multiple health conditions.   Objective:   Blood pressure 98/60, pulse 70, temperature 98 F (36.7 C), height 5\' 1"  (1.549 m), weight 149 lb (67.6 kg), SpO2 99%.  Body mass index is 28.15 kg/m.  General: Cooperative, alert, well developed, in no acute distress. HEENT: Conjunctivae and lids unremarkable. Cardiovascular: Regular rhythm.  Lungs: Normal work of breathing. Neurologic: No focal deficits.   Lab Results  Component Value Date   CREATININE 0.80  11/30/2023   BUN 16 11/30/2023   NA 140 11/30/2023   K 4.4 11/30/2023   CL 101 11/30/2023   CO2 25 11/30/2023   Lab Results  Component Value Date   ALT 21 11/30/2023   AST 18 11/30/2023   ALKPHOS 49 11/30/2023   BILITOT 0.5 11/30/2023   Lab Results  Component Value Date   HGBA1C 5.6 11/30/2023   HGBA1C 5.5 07/27/2023   HGBA1C 5.4 04/05/2023   HGBA1C 5.5 11/08/2022   HGBA1C 5.7 (H) 06/17/2022   Lab Results  Component Value Date   INSULIN  4.7 11/30/2023   INSULIN  4.3 07/27/2023   INSULIN  4.9 04/05/2023   INSULIN  4.7 11/08/2022   INSULIN  3.1 06/17/2022   Lab Results  Component Value Date   TSH 1.610 11/30/2023   Lab Results  Component Value Date   CHOL 103 11/30/2023   HDL 59 11/30/2023   LDLCALC 30 11/30/2023   TRIG 66 11/30/2023   CHOLHDL 1.7 11/30/2023   Lab Results  Component Value Date   VD25OH 64.6 11/30/2023   VD25OH 60.1 07/27/2023   VD25OH 49.4 04/05/2023   Lab Results  Component Value Date   WBC 6.2 12/25/2020   HGB 14.3 12/25/2020   HCT 41.9 12/25/2020   MCV 88.3 12/25/2020   PLT 260.0 12/25/2020   No results found for: "IRON", "TIBC", "FERRITIN"  Attestation Statements:   Reviewed by clinician on day of visit: allergies, medications, problem list, medical history, surgical history, family history, social history, and previous encounter notes.  Time spent on visit including pre-visit chart review and post-visit care and charting was 40 minutes.   Due to seeing both her and her husband during same OV- extra time is needed to chart review, pre-chart, and manage care   I have reviewed the above documentation for accuracy and completeness, and I agree with the above. -  Willie Loy d. Chrisopher Pustejovsky, NP-C

## 2024-01-27 ENCOUNTER — Encounter: Payer: 59 | Admitting: Physician Assistant

## 2024-02-04 ENCOUNTER — Encounter (HOSPITAL_BASED_OUTPATIENT_CLINIC_OR_DEPARTMENT_OTHER): Payer: Self-pay

## 2024-02-04 ENCOUNTER — Emergency Department (HOSPITAL_BASED_OUTPATIENT_CLINIC_OR_DEPARTMENT_OTHER)
Admission: EM | Admit: 2024-02-04 | Discharge: 2024-02-04 | Disposition: A | Discharge status: Home / Self Care | Attending: Emergency Medicine | Admitting: Emergency Medicine

## 2024-02-04 ENCOUNTER — Emergency Department (HOSPITAL_BASED_OUTPATIENT_CLINIC_OR_DEPARTMENT_OTHER)

## 2024-02-04 DIAGNOSIS — R1032 Left lower quadrant pain: Secondary | ICD-10-CM | POA: Diagnosis present

## 2024-02-04 DIAGNOSIS — M545 Low back pain, unspecified: Secondary | ICD-10-CM | POA: Diagnosis not present

## 2024-02-04 LAB — CBC
HCT: 41.3 % (ref 36.0–46.0)
Hemoglobin: 14.3 g/dL (ref 12.0–15.0)
MCH: 30.5 pg (ref 26.0–34.0)
MCHC: 34.6 g/dL (ref 30.0–36.0)
MCV: 88.1 fL (ref 80.0–100.0)
Platelets: 323 10*3/uL (ref 150–400)
RBC: 4.69 MIL/uL (ref 3.87–5.11)
RDW: 11.9 % (ref 11.5–15.5)
WBC: 7.8 10*3/uL (ref 4.0–10.5)
nRBC: 0 % (ref 0.0–0.2)

## 2024-02-04 LAB — COMPREHENSIVE METABOLIC PANEL WITH GFR
ALT: 14 U/L (ref 0–44)
AST: 17 U/L (ref 15–41)
Albumin: 4.7 g/dL (ref 3.5–5.0)
Alkaline Phosphatase: 47 U/L (ref 38–126)
Anion gap: 12 (ref 5–15)
BUN: 17 mg/dL (ref 6–20)
CO2: 23 mmol/L (ref 22–32)
Calcium: 9.8 mg/dL (ref 8.9–10.3)
Chloride: 106 mmol/L (ref 98–111)
Creatinine, Ser: 0.83 mg/dL (ref 0.44–1.00)
GFR, Estimated: >60 mL/min (ref >60)
Glucose, Bld: 128 mg/dL — ABNORMAL HIGH (ref 70–99)
Potassium: 4.3 mmol/L (ref 3.5–5.1)
Sodium: 141 mmol/L (ref 135–145)
Total Bilirubin: 0.6 mg/dL (ref 0.0–1.2)
Total Protein: 7 g/dL (ref 6.5–8.1)

## 2024-02-04 LAB — URINALYSIS, ROUTINE W REFLEX MICROSCOPIC
Bilirubin Urine: NEGATIVE
Glucose, UA: NEGATIVE mg/dL
Ketones, ur: NEGATIVE mg/dL
Leukocytes,Ua: NEGATIVE
Nitrite: NEGATIVE
Protein, ur: NEGATIVE mg/dL
Specific Gravity, Urine: <=1.005 (ref 1.005–1.030)
pH: 6 (ref 5.0–8.0)

## 2024-02-04 LAB — LIPASE, BLOOD: Lipase: 33 U/L (ref 11–51)

## 2024-02-04 LAB — URINALYSIS, MICROSCOPIC (REFLEX)
RBC / HPF: NONE SEEN RBC/hpf (ref 0–5)
Squamous Epithelial / HPF: NONE SEEN /HPF (ref 0–5)
WBC, UA: NONE SEEN WBC/hpf (ref 0–5)

## 2024-02-04 MED ORDER — IOHEXOL 300 MG/ML  SOLN
100.0000 mL | Freq: Once | INTRAMUSCULAR | Status: AC | PRN
  Administered 2024-02-04: 100 mL via INTRAVENOUS

## 2024-02-04 NOTE — ED Triage Notes (Signed)
 Pt reports that she thinks she is having a flare of diverticulitis. States that she has been having some abdominal pain over the last week. Pt states discomfort in left side and around to the lower back.

## 2024-02-04 NOTE — Discharge Instructions (Addendum)
 I would recommend a clear liquid diet for the next 2 days.  You can slowly progress after that.  They can appointment to follow-up with your gastroenterologist if your symptoms are not improving in the next couple days.  Return to the emergency room if you have any worsening symptoms.

## 2024-02-04 NOTE — ED Notes (Signed)
 Reviewed discharge instructions and recommendations with pt. Pt aware to follow up with GI with symptoms worsen. Pt states understanding. Pain improved  Ambulatory at discharge with husband

## 2024-02-04 NOTE — ED Provider Notes (Signed)
 [facility] Provider Note   CSN: [id number] Arrival date & time: 02/04/24  1059     History  Chief Complaint  Patient presents with   Abdominal Pain    Kim Clements is a 58 y.o. female.  Patient is a 58 year old female who presents with some discomfort in her left lower quadrant.  Is been going on for about a week but got worse over the last few days.  She denies any nausea or vomiting.  No known fevers.  She says the pain is started to go a little bit around to her left lower back.  She has had a little bit of looser stools over the last couple days.  No urinary symptoms.  She does have a prior history of diverticulitis but the last time was about 10 years ago.  No known history of kidney stones.       Home Medications Prior to Admission medications   Medication Sig Start Date End Date Taking? Authorizing Provider  AMBULATORY NON FORMULARY MEDICATION Allergy Shots 2 shots per week    [provider]  azelastine  (OPTIVAR ) 0.05 % ophthalmic solution 2 (two) times daily.    [provider]  Azelastine -Fluticasone  137-50 MCG/ACT SUSP PLACE 1 SPRAY INTO THE NOSE EVERY 12 (TWELVE) HOURS. Nasal for 30 Days    [provider]  Cholecalciferol  (VITAMIN D3) 25 MCG (1000 UT) CAPS Take 4 capsules by mouth daily. 4000 IU/daily    [provider]  colestipol  (COLESTID ) 1 g tablet Oral for 18 Days    [provider]  Eluxadoline  (VIBERZI ) 75 MG TABS Take 1 tablet (75 mg total) by mouth 2 (two) times daily. 10/11/23   Nandigam, Kavitha V, MD  EPINEPHrine 0.3 mg/0.3 mL IJ SOAJ injection SMARTSIG:IM As Directed PRN 01/26/22   [provider]  Evolocumab  (REPATHA  SURECLICK) 140 MG/ML SOAJ INJECT 140 MG INTO THE SKIN EVERY 14 (FOURTEEN) DAYS. 08/22/23   Hilty, Aviva Lemmings, MD  fexofenadine (ALLEGRA) 180 MG tablet Take 180 mg by mouth daily.    [provider]  itraconazole (SPORANOX)  10 MG/ML solution Take 10 mLs by mouth daily. Medication is not oral, it is a topical application.    [provider]  levocetirizine (XYZAL) 5 MG tablet TAKE 1 TABLET BY MOUTH EVERY DAY IN THE EVENING 30 DAYS Oral for 90 Days    [provider]  mupirocin ointment (BACTROBAN) 2 % External for 20 Days    [provider]  NEXLETOL  180 MG TABS TAKE 1 TABLET BY MOUTH EVERY DAY 03/28/23   Hilty, Aviva Lemmings, MD      Allergies    Erythromycin, Penicillins, and Sulfa antibiotics    Review of Systems   Review of Systems  Constitutional:  Negative for chills, diaphoresis, fatigue and fever.  HENT:  Negative for congestion, rhinorrhea and sneezing.   Eyes: Negative.   Respiratory:  Negative for cough, chest tightness and shortness of breath.   Cardiovascular:  Negative for chest pain and leg swelling.  Gastrointestinal:  Positive for abdominal pain and diarrhea (Slightly looser stools). Negative for blood in stool, nausea and vomiting.  Genitourinary:  Positive for flank pain. Negative for difficulty urinating, frequency and hematuria.  Musculoskeletal:  Negative for arthralgias and back pain.  Skin:  Negative for rash.  Neurological:  Negative for dizziness, speech difficulty, weakness, numbness and headaches.    Physical Exam Updated Vital Signs BP (!) 159/72 (BP Location: Right Arm)  Pulse 95   Temp 99.2 F (37.3 C) (Oral)   Resp 18   Ht 5\' 1"  (1.549 m)   Wt 67.6 kg   SpO2 99%   BMI 28.16 kg/m  Physical Exam Constitutional:      Appearance: She is well-developed.  HENT:     Head: Normocephalic and atraumatic.  Eyes:     Pupils: Pupils are equal, round, and reactive to light.  Cardiovascular:     Rate and Rhythm: Normal rate and regular rhythm.     Heart sounds: Normal heart sounds.  Pulmonary:     Effort: Pulmonary effort is normal. No respiratory distress.     Breath sounds: Normal breath sounds. No wheezing or rales.  Chest:     Chest wall: No  tenderness.  Abdominal:     General: Bowel sounds are normal.     Palpations: Abdomen is soft.     Tenderness: There is abdominal tenderness in the left lower quadrant. There is no guarding or rebound.  Musculoskeletal:        General: Normal range of motion.     Cervical back: Normal range of motion and neck supple.  Lymphadenopathy:     Cervical: No cervical adenopathy.  Skin:    General: Skin is warm and dry.     Findings: No rash.  Neurological:     Mental Status: She is alert and oriented to person, place, and time.     ED Results / Procedures / Treatments   Labs (all labs ordered are listed, but only abnormal results are displayed) Labs Reviewed  COMPREHENSIVE METABOLIC PANEL WITH GFR - Abnormal; Notable for the following components:      Result Value   Glucose, Bld 128 (*)    All other components within normal limits  URINALYSIS, ROUTINE W REFLEX MICROSCOPIC - Abnormal; Notable for the following components:   Hgb urine dipstick TRACE (*)    All other components within normal limits  URINALYSIS, MICROSCOPIC (REFLEX) - Abnormal; Notable for the following components:   Bacteria, UA RARE (*)    All other components within normal limits  LIPASE, BLOOD  CBC    EKG None  Radiology CT ABDOMEN PELVIS W CONTRAST Result Date: 02/04/2024 CLINICAL DATA:  Left lower quadrant abdominal pain EXAM: CT ABDOMEN AND PELVIS WITH CONTRAST TECHNIQUE: Multidetector CT imaging of the abdomen and pelvis was performed using the standard protocol following bolus administration of intravenous contrast. RADIATION DOSE REDUCTION: This exam was performed according to the departmental dose-optimization program which includes automated exposure control, adjustment of the mA and/or kV according to patient size and/or use of iterative reconstruction technique. CONTRAST:  OMNIPAQUE  IOHEXOL  300 MG/ML  SOLN COMPARISON:  CT of the abdomen and pelvis performed June 15, 2020 FINDINGS: Lower chest: No  acute abnormality. Hepatobiliary: No focal liver abnormality is seen. No gallstones, gallbladder wall thickening, or biliary dilatation. Pancreas: Unremarkable. No pancreatic ductal dilatation or surrounding inflammatory changes. Spleen: Normal in size without focal abnormality. Adrenals/Urinary Tract: The adrenals are within normal limits. A simple appearing right renal cyst is present which measures 3.9 cm. Urinary bladder is within normal limits. Stomach/Bowel: Stomach is within normal limits. Appendix appears normal. No evidence of bowel wall thickening, distention, or inflammatory changes. Colonic diverticulosis. Vascular/Lymphatic: No significant vascular findings are present. No enlarged abdominal or pelvic lymph nodes. Reproductive: Status post hysterectomy. No adnexal masses. Other: Nothing significant. Musculoskeletal: No acute or significant osseous findings. IMPRESSION: 1. Diverticulosis without evidence of diverticulitis. 2. No evidence  of appendicitis. Electronically Signed   By: Reagan Camera M.D.   On: 02/04/2024 12:56    Procedures Procedures    Medications Ordered in ED Medications  iohexol  (OMNIPAQUE ) 300 MG/ML solution 100 mL (100 mLs Intravenous Contrast Given 02/04/24 1224)    ED Course/ Medical Decision Making/ A&P                                 Medical Decision Making Amount and/or Complexity of Data Reviewed Labs: ordered. Radiology: ordered.  Risk Prescription drug management.   D82-year-old who presents with left lower quadrant abdominal pain.  She is otherwise well-appearing.  She was mildly tachycardic on arrival but this resolved.  Her labs are nonconcerning.  Urine is not consistent with infection.  No hematuria.  CT scan shows no acute abnormality.  No evidence of diverticulitis.  No evidence of bowel obstruction.  No evidence of kidney stone.  She is overall well-appearing.  Her abdominal exam has some mild tenderness in the left lower quadrant but no  peritoneal signs.  She was discharged home in good condition.  She was advised to use a clear liquid diet for the next 48 hours.  She was advised to follow-up with her gastroenterologist if her symptoms are not improving.  Return precautions were given.  Final Clinical Impression(s) / ED Diagnoses Final diagnoses:  Left lower quadrant abdominal pain    Rx / DC Orders ED Discharge Orders     None         Hershel Los, MD 02/04/24 1338

## 2024-02-07 ENCOUNTER — Telehealth: Payer: Self-pay | Admitting: Gastroenterology

## 2024-02-07 ENCOUNTER — Ambulatory Visit (INDEPENDENT_AMBULATORY_CARE_PROVIDER_SITE_OTHER): Payer: 59 | Admitting: Physician Assistant

## 2024-02-07 ENCOUNTER — Encounter: Payer: Self-pay | Admitting: Physician Assistant

## 2024-02-07 VITALS — BP 108/62 | HR 77 | Temp 97.9°F | Ht 61.81 in | Wt 148.8 lb

## 2024-02-07 DIAGNOSIS — E739 Lactose intolerance, unspecified: Secondary | ICD-10-CM

## 2024-02-07 DIAGNOSIS — K5790 Diverticulosis of intestine, part unspecified, without perforation or abscess without bleeding: Secondary | ICD-10-CM

## 2024-02-07 DIAGNOSIS — Z0001 Encounter for general adult medical examination with abnormal findings: Secondary | ICD-10-CM | POA: Diagnosis not present

## 2024-02-07 DIAGNOSIS — R110 Nausea: Secondary | ICD-10-CM

## 2024-02-07 DIAGNOSIS — Z0000 Encounter for general adult medical examination without abnormal findings: Secondary | ICD-10-CM

## 2024-02-07 MED ORDER — ONDANSETRON 4 MG PO TBDP: 4.0000 mg | ORAL_TABLET | Freq: Three times a day (TID) | ORAL | 0 refills | Status: AC | PRN

## 2024-02-07 NOTE — Telephone Encounter (Signed)
 Patient with abdominal pain and diarrhea. She feels a little sick too. Recent exposure to lactose. She is very careful with her diet and did not realize the food had regular milk in it. She took Lactacid immediately.  Her abdomen has been hurting for a couple of days prior to this incident. She had LLQ pain that wrapped around into her "tail end." Imaging and labs were WNL's. She was seen in the ER and told to see GI "asap."  The patient is scheduled for a sooner appointment. Instructed to maintain hydration. If her pain worsens, or she develops fever, she will go to the ER or an urgent care. She wants to take an Imodium now to slow the diarrhea.

## 2024-02-07 NOTE — Progress Notes (Signed)
 Patient ID: Kim Clements, female    DOB: 1966-03-09, 58 y.o.   MRN: [id number]   Assessment & Plan:   Annual physical exam  Diverticulosis  Nausea -     Ondansetron; Take 1 tablet (4 mg total) by mouth every 8 (eight) hours as needed for nausea or vomiting.  Dispense: 10 tablet; Refill: 0  Lactose intolerance    Age-appropriate screening and counseling performed today. Preventive measures discussed and printed in AVS for patient.   Patient Counseling: [x]   Nutrition: Stressed importance of moderation in sodium/caffeine intake, saturated fat and cholesterol, caloric balance, sufficient intake of fresh fruits, vegetables, and fiber.  [x]   Stressed the importance of regular exercise.   [x]   Substance Abuse: Discussed cessation/primary prevention of tobacco, alcohol, or other drug use; driving or other dangerous activities under the influence; availability of treatment for abuse.   []   Injury prevention: Discussed safety belts, safety helmets, smoke detector, smoking near bedding or upholstery.   []   Sexuality: Discussed sexually transmitted diseases, partner selection, use of condoms, avoidance of unintended pregnancy  and contraceptive alternatives.   [x]   Dental health: Discussed importance of regular tooth brushing, flossing, and dental visits.  [x]   Health maintenance and immunizations reviewed. Please refer to Health maintenance section.      Diverticulosis hx, recent LLQ abdominal pain Intermittent gastrointestinal symptoms suggestive of diverticulitis, including abdominal discomfort and changes in bowel habits. Recent ER visit with CT scan and labs showed no active infection or kidney stones. Symptoms have improved with dietary modifications, but not fully resolved. Possible mild diverticulitis not detected on imaging. Similar symptoms in the past, possibly related to food poisoning or other gastrointestinal disturbances. - Follow up with gastroenterologist for  further evaluation and management. - Continue bland diet and monitor symptoms. - Consider potential triggers such as food poisoning or dietary irritants.  Lactose Intolerance Lactose intolerance with recent gastrointestinal distress after consuming pasta that may have contained lactose. Symptoms included vomiting and diarrhea, resolving within a few hours. She is aware of lactose as a potential trigger and is cautious about dietary choices. - Prescribe Zofran (ondansetron) for nausea, ensuring it is lactose-free. - Advise on dietary management to avoid lactose-containing foods.     Return if symptoms worsen or fail to improve.    Subjective:    Chief Complaint  Patient presents with   Annual Exam    Pt in office for annual CPE and fasting labs; pt seen at weight loss provider and completed some labs; also seen in ED and had some labs; thinks she had diverticulitis flare up; pt states she is getting better than she was.     HPI Discussed the use of AI scribe software for clinical note transcription with the patient, who gave verbal consent to proceed.  History of Present Illness Kim Clements "Kim Clements" is a 58 year old female who presents for a physical exam and evaluation of recent gastrointestinal issues.  She has been experiencing gastrointestinal issues for about a week and a half, with symptoms worsening intermittently. The discomfort intensified on a Friday, leading her to seek emergency care by Saturday. Diagnostic studies, including a urinalysis, CT scan with contrast, and blood work, showed no active infection or kidney stones. She was informed that it might be a mild case of diverticulitis. Her symptoms have improved but have not fully resolved.  In March, she had a brief episode of diarrhea and weakness that resolved within a few hours. On Mother's Day,  after consuming pasta at a restaurant, she experienced vomiting and diarrhea, suspecting lactose intolerance as  the cause. The symptoms lasted a few hours, and no one else, including her husband, experienced similar symptoms.  She has a history of lactose intolerance and is cautious about her diet to avoid triggers. Following her ER visit, she adheres to a bland diet to aid recovery. She recalls a past episode of microscopic hematuria, which was investigated but not attributed to any specific cause.  She is not currently on any medications but was previously on amitriptyline . She is up to date with her gynecological and dermatological check-ups, and she has a family history of breast cancer, which necessitates regular screenings.     Past Medical History:  Diagnosis Date   Anal fissure    Anxiety    Arthritis    Asthma    Back pain    Colon polyps    Diverticulosis    Edema, lower extremity    GERD (gastroesophageal reflux disease)    HLD (hyperlipidemia)    IBS (irritable bowel syndrome)    Joint pain    Lactose intolerance    Osteoarthritis    Vitamin D  deficiency     Past Surgical History:  Procedure Laterality Date   ABDOMINAL HYSTERECTOMY  2007   LAPAROSCOPY  1988   Laser. Indication: Endometriosis.   TONSILLECTOMY  1971    Family History  Problem Relation Age of Onset   Colon polyps Mother    Heart disease Mother    Rheum arthritis Mother    Osteoarthritis Mother    High blood pressure Mother    Anxiety disorder Mother    Colon polyps Father    Stroke Father    High blood pressure Father    High Cholesterol Father    Heart disease Father    Heart disease Maternal Grandmother    Heart disease Maternal Grandfather    Breast cancer Paternal Grandmother        all of paternal females   Prostate cancer Paternal Grandfather    Heart disease Paternal Grandfather    Breast cancer Paternal Aunt    Colon cancer Neg Hx     Social History   Tobacco Use   Smoking status: Never   Smokeless tobacco: Never  Vaping Use   Vaping status: Never Used  Substance Use Topics    Alcohol use: No    Alcohol/week: 0.0 standard drinks of alcohol   Drug use: No     Allergies  Allergen Reactions   Lactose Intolerance (Gi)     Vomiting and diarrhea    Penicillin G Hives   Erythromycin Rash   Penicillins Hives   Sulfa Antibiotics Rash    Review of Systems NEGATIVE UNLESS OTHERWISE INDICATED IN HPI      Objective:     BP 108/62 (BP Location: Left Arm, Patient Position: Sitting, Cuff Size: Normal)   Pulse 77   Temp 97.9 F (36.6 C) (Temporal)   Ht 5' 1.81" (1.57 m)   Wt 148 lb 12.8 oz (67.5 kg)   SpO2 100%   BMI 27.38 kg/m   Wt Readings from Last 3 Encounters:  02/07/24 148 lb 12.8 oz (67.5 kg)  02/04/24 149 lb 0.5 oz (67.6 kg)  01/11/24 149 lb (67.6 kg)    BP Readings from Last 3 Encounters:  02/07/24 108/62  02/04/24 (!) 159/72  01/11/24 98/60     Physical Exam Vitals and nursing note reviewed.  Constitutional:  Appearance: Normal appearance. She is normal weight. She is not toxic-appearing.     Comments: Wears glasses   HENT:     Head: Normocephalic and atraumatic.     Right Ear: Tympanic membrane, ear canal and external ear normal.     Left Ear: Tympanic membrane, ear canal and external ear normal.     Nose: Nose normal.     Mouth/Throat:     Mouth: Mucous membranes are moist.  Eyes:     Extraocular Movements: Extraocular movements intact.     Conjunctiva/sclera: Conjunctivae normal.     Pupils: Pupils are equal, round, and reactive to light.  Cardiovascular:     Rate and Rhythm: Normal rate and regular rhythm.     Pulses: Normal pulses.     Heart sounds: Normal heart sounds.  Pulmonary:     Effort: Pulmonary effort is normal.     Breath sounds: Normal breath sounds.  Abdominal:     General: Abdomen is flat. Bowel sounds are normal.     Palpations: Abdomen is soft.  Musculoskeletal:        General: Normal range of motion.     Cervical back: Normal range of motion and neck supple.  Skin:    General: Skin is warm and  dry.     Comments: Diffuse Sk's and cherry angiomas   Neurological:     General: No focal deficit present.     Mental Status: She is alert and oriented to person, place, and time.  Psychiatric:        Mood and Affect: Mood normal.        Behavior: Behavior normal.        Thought Content: Thought content normal.        Judgment: Judgment normal.             Alanson Hausmann M Byan Poplaski, PA-C

## 2024-02-07 NOTE — Telephone Encounter (Signed)
 Inbound call from patient, wishing to speak to [facility] in regards to message below. States she forgot something and would like to follow up.

## 2024-02-07 NOTE — Telephone Encounter (Signed)
 Inbound call from patient, states she would like to speak to beth in regards to recently eating some "lactose" patient states she is intolerant and would like to know what she can do to make symptoms subside prior to a flare up. Patient was scheduled for appointment on 7/14 with Reginal Capra, PA for abd pain but would like to see if Jerlene Moody can "squeeze her in" .

## 2024-02-08 ENCOUNTER — Ambulatory Visit (INDEPENDENT_AMBULATORY_CARE_PROVIDER_SITE_OTHER): Admitting: Adult Health

## 2024-02-08 ENCOUNTER — Encounter (INDEPENDENT_AMBULATORY_CARE_PROVIDER_SITE_OTHER): Payer: Self-pay | Admitting: Adult Health

## 2024-02-08 VITALS — BP 112/60 | HR 64 | Temp 97.9°F | Ht 61.0 in | Wt 145.0 lb

## 2024-02-08 DIAGNOSIS — F411 Generalized anxiety disorder: Secondary | ICD-10-CM

## 2024-02-08 DIAGNOSIS — E559 Vitamin D deficiency, unspecified: Secondary | ICD-10-CM

## 2024-02-08 DIAGNOSIS — R1032 Left lower quadrant pain: Secondary | ICD-10-CM | POA: Diagnosis not present

## 2024-02-08 DIAGNOSIS — E739 Lactose intolerance, unspecified: Secondary | ICD-10-CM | POA: Diagnosis not present

## 2024-02-08 DIAGNOSIS — R7303 Prediabetes: Secondary | ICD-10-CM

## 2024-02-08 DIAGNOSIS — E669 Obesity, unspecified: Secondary | ICD-10-CM

## 2024-02-08 DIAGNOSIS — E66811 Obesity, class 1: Secondary | ICD-10-CM

## 2024-02-08 DIAGNOSIS — Z6827 Body mass index (BMI) 27.0-27.9, adult: Secondary | ICD-10-CM

## 2024-02-08 NOTE — Progress Notes (Signed)
 WEIGHT SUMMARY AND BIOMETRICS  Vitals Temp: 97.9 F (36.6 C) BP: 112/60 Pulse Rate: 64 SpO2: 95 %   Anthropometric Measurements Height: 5\' 1"  (1.549 m) Weight: 145 lb (65.8 kg) BMI (Calculated): 27.41 Weight at Last Visit: 149 lb Weight Lost Since Last Visit: 4 lb Weight Gained Since Last Visit: 0 Starting Weight: 170 lb Total Weight Loss (lbs): 25 lb (11.3 kg)   Body Composition  Body Fat %: 38.1 % Fat Mass (lbs): 55.4 lbs Muscle Mass (lbs): 85.6 lbs Total Body Water (lbs): 62.2 lbs Visceral Fat Rating : 9   Other Clinical Data RMR: 1310 Fasting: no Labs: no Today's Visit #: 39 Starting Date: 07/07/21    Chief Complaint:   OBESITY Kim Clements is here to discuss her progress with her obesity treatment plan.  She is on the the Category 1 Plan and states she is following her eating plan approximately 0 % of the time.  She states she is exercising: None   Interim History:  Reviewed Bioimpedance results with pt: Muscle Mass: -1.2 lbs Adipose Mass: -2.8 lbs  Starting weight 170  lbs with corresponding BMI 32.14 Today's weight 145 lbs with corresponding BMI 27.5  She fells that her Repatha  injection may be worsening pre-existing GI disorders- plans to f/u with her established Cardiologist  Of Note-Reviewed recent ED encounter notes/lab/imaging at length during OV today  Subjective:   1. Lactose intolerance She reports recent contact with milk products and experienced significant GI upset, ie: N/V/D She reports to be GI "stable" the last 36 hours  2. Prediabetes Lab Results  Component Value Date   HGBA1C 5.6 11/30/2023   HGBA1C 5.5 07/27/2023   HGBA1C 5.4 04/05/2023    She denies recent polyphagia She is not on any antidiabetic medications  3. Vitamin D  deficiency She is on OTC Vit D3 1,000, 4 caps per day = 4,000 international units   4. Left lower quadrant pain 02/04/2024 ED Encounter    Chief Complaint  Patient presents with    Abdominal Pain      Kim Clements is a 58 y.o. female.   Patient is a 58 year old female who presents with some discomfort in her left lower quadrant.  Is been going on for about a week but got worse over the last few days.  She denies any nausea or vomiting.  No known fevers.  She says the pain is started to go a little bit around to her left lower back.  She has had a little bit of looser stools over the last couple days.  No urinary symptoms.  She does have a prior history of diverticulitis but the last time was about 10 years ago.  No known history of kidney stones ED Course/ Medical Decision Making/ A&P                               Medical Decision Making Amount and/or Complexity of Data Reviewed Labs: ordered. Radiology: ordered.   Risk Prescription drug management.     D44-year-old who presents with left lower quadrant abdominal pain.  She is otherwise well-appearing.  She was mildly tachycardic on arrival but this resolved.  Her labs are nonconcerning.  Urine is not consistent with infection.  No hematuria.  CT scan shows no acute abnormality.  No evidence of diverticulitis.  No evidence of bowel obstruction.  No evidence of kidney stone.  She is overall well-appearing.  Her abdominal  exam has some mild tenderness in the left lower quadrant but no peritoneal signs.  She was discharged home in good condition.  She was advised to use a clear liquid diet for the next 48 hours.  She was advised to follow-up with her gastroenterologist if her symptoms are not improving.  Return precautions were given.  5. GAD (generalized anxiety disorder) She endorses anxiety with recent GI upset and subsequent ED visit ED encounter notes/labs/imaging discussed at great length today.  Assessment/Plan:   1. Lactose intolerance (Primary) Remain well hydrated with water  Avoid known trigger foods  2. Prediabetes Continue healthy eating and increase activity as tolerated  3. Vitamin D   deficiency Continue OTC supplementation and monitor labs  4. Left lower quadrant pain F/u with established GI  5. GAD (generalized anxiety disorder) When able, increase regular exercise Surround self with positive/supportive friends and family  6. Obesity, CURRENT BMI 27.5  Kim Clements is currently in the action stage of change. As such, her goal is to maintain weight for now. She has agreed to the Category 1 Plan.   Exercise goals: All adults should avoid inactivity. Some physical activity is better than none, and adults who participate in any amount of physical activity gain some health benefits. Adults should also include muscle-strengthening activities that involve all major muscle groups on 2 or more days a week.  Behavioral modification strategies: increasing lean protein intake, decreasing simple carbohydrates, increasing vegetables, increasing water intake, no skipping meals, meal planning and cooking strategies, keeping healthy foods in the home, ways to avoid boredom eating, and planning for success.  Kim Clements has agreed to follow-up with our clinic in 4 weeks. She was informed of the importance of frequent follow-up visits to maximize her success with intensive lifestyle modifications for her multiple health conditions.   Objective:   Blood pressure 112/60, pulse 64, temperature 97.9 F (36.6 C), height 5\' 1"  (1.549 m), weight 145 lb (65.8 kg), SpO2 95%. Body mass index is 27.4 kg/m.  General: Cooperative, alert, well developed, in no acute distress. HEENT: Conjunctivae and lids unremarkable. Cardiovascular: Regular rhythm.  Lungs: Normal work of breathing. Neurologic: No focal deficits.   Lab Results  Component Value Date   CREATININE 0.83 02/04/2024   BUN 17 02/04/2024   NA 141 02/04/2024   K 4.3 02/04/2024   CL 106 02/04/2024   CO2 23 02/04/2024   Lab Results  Component Value Date   ALT 14 02/04/2024   AST 17 02/04/2024   ALKPHOS 47 02/04/2024   BILITOT 0.6  02/04/2024   Lab Results  Component Value Date   HGBA1C 5.6 11/30/2023   HGBA1C 5.5 07/27/2023   HGBA1C 5.4 04/05/2023   HGBA1C 5.5 11/08/2022   HGBA1C 5.7 (H) 06/17/2022   Lab Results  Component Value Date   INSULIN  4.7 11/30/2023   INSULIN  4.3 07/27/2023   INSULIN  4.9 04/05/2023   INSULIN  4.7 11/08/2022   INSULIN  3.1 06/17/2022   Lab Results  Component Value Date   TSH 1.610 11/30/2023   Lab Results  Component Value Date   CHOL 103 11/30/2023   HDL 59 11/30/2023   LDLCALC 30 11/30/2023   TRIG 66 11/30/2023   CHOLHDL 1.7 11/30/2023   Lab Results  Component Value Date   VD25OH 64.6 11/30/2023   VD25OH 60.1 07/27/2023   VD25OH 49.4 04/05/2023   Lab Results  Component Value Date   WBC 7.8 02/04/2024   HGB 14.3 02/04/2024   HCT 41.3 02/04/2024   MCV 88.1 02/04/2024  PLT 323 02/04/2024   No results found for: "IRON", "TIBC", "FERRITIN"  Attestation Statements:   Reviewed by clinician on day of visit: allergies, medications, problem list, medical history, surgical history, family history, social history, and previous encounter notes.  Time spent on visit including pre-visit chart review and post-visit care and charting was 45 minutes.   Due to seeing both her and her husband during same OV- extra time is needed to chart review, pre-chart, and manage care   I have reviewed the above documentation for accuracy and completeness, and I agree with the above. -  Kema Santaella d. Sam Wunschel, NP-C

## 2024-02-09 ENCOUNTER — Other Ambulatory Visit

## 2024-02-10 ENCOUNTER — Other Ambulatory Visit: Payer: Self-pay | Admitting: Physician Assistant

## 2024-02-10 DIAGNOSIS — G8929 Other chronic pain: Secondary | ICD-10-CM

## 2024-02-10 NOTE — Telephone Encounter (Signed)
 Please advise on refill; last sent to pharmacy in March and pt is due for refill; discontinued by another office.

## 2024-02-17 ENCOUNTER — Ambulatory Visit (INDEPENDENT_AMBULATORY_CARE_PROVIDER_SITE_OTHER): Admitting: Gastroenterology

## 2024-02-17 ENCOUNTER — Encounter: Payer: Self-pay | Admitting: Gastroenterology

## 2024-02-17 VITALS — BP 122/60 | HR 74 | Ht 61.0 in | Wt 151.0 lb

## 2024-02-17 DIAGNOSIS — E739 Lactose intolerance, unspecified: Secondary | ICD-10-CM | POA: Insufficient documentation

## 2024-02-17 DIAGNOSIS — K580 Irritable bowel syndrome with diarrhea: Secondary | ICD-10-CM | POA: Diagnosis not present

## 2024-02-17 DIAGNOSIS — R1032 Left lower quadrant pain: Secondary | ICD-10-CM | POA: Diagnosis not present

## 2024-02-17 MED ORDER — HYOSCYAMINE SULFATE 0.125 MG SL SUBL: 0.1250 mg | SUBLINGUAL_TABLET | Freq: Three times a day (TID) | SUBLINGUAL | 1 refills | Status: DC | PRN

## 2024-02-17 NOTE — Patient Instructions (Signed)
 We have sent the following medications to your pharmacy for you to pick up at your convenience: Levsin  0.125 SL every 8 hours as needed for abdominal pain.   You have been scheduled for a colonoscopy. Please follow written instructions given to you at your visit today.   If you use inhalers (even only as needed), please bring them with you on the day of your procedure.  DO NOT TAKE 7 DAYS PRIOR TO TEST- Trulicity (dulaglutide) Ozempic, Wegovy (semaglutide) Mounjaro (tirzepatide) Bydureon Bcise (exanatide extended release)  DO NOT TAKE 1 DAY PRIOR TO YOUR TEST Rybelsus (semaglutide) Adlyxin (lixisenatide) Victoza (liraglutide) Byetta (exanatide) ___________________________________________________________________  _______________________________________________________  If your blood pressure at your visit was 140/90 or greater, please contact your primary care physician to follow up on this.  _______________________________________________________  If you are age 48 or older, your body mass index should be between 23-30. Your Body mass index is 28.53 kg/m. If this is out of the aforementioned range listed, please consider follow up with your Primary Care Provider.  If you are age 64 or younger, your body mass index should be between 19-25. Your Body mass index is 28.53 kg/m. If this is out of the aformentioned range listed, please consider follow up with your Primary Care Provider.   ________________________________________________________  The [facility] providers would like to encourage you to use MYCHART to communicate with providers for non-urgent requests or questions.  Due to long hold times on the telephone, sending your provider a message by [facility] may be a faster and more efficient way to get a response.  Please allow 48 business hours for a response.  Please remember that this is for non-urgent requests.  _______________________________________________________

## 2024-02-17 NOTE — Progress Notes (Signed)
 02/17/2024 Kim Clements [id number] 01/30/1966   HISTORY OF PRESENT ILLNESS: This is a 58 year old female who is a patient of Dr. Allean Aran.  She has IBS-D and lactose intolerance.  She takes Viberzi  75 mg daily for her IBS symptoms.  Is here on this occasion for follow-up of her recent ED visit.  He was evaluated on 5/31 with complaints of severe left lower quadrant abdominal pain.  CT scan was unremarkable, showed diverticulosis without diverticulitis.  Labs unremarkable.  Was told to follow-up with her gastroenterologist and consider repeat colonoscopy.  She says that the pain has significantly improved.  She says that really just yesterday and today were the first days that she feels much better.  She has had treatment empirically for diverticulitis remotely, several years ago, but unaware of any actual CT/radiographic evidence of diverticulitis.  Colonoscopy February 12, 2016 showed sigmoid diverticulosis internal hemorrhoids otherwise normal exam   Colonoscopy Jan 09, 2010 by Dr. Lavaughn Portland at [facility] sigmoid diverticulosis otherwise normal exam   Colonoscopy October 20 2004 by Dr. [facility].  Terminal ileum biopsies normal mucosa.  2 small polypoid appearing polyps were removed with biopsy forceps showed benign colonic mucosa.  Sigmoid diverticulosis  Past Medical History:  Diagnosis Date   Anal fissure    Anxiety    Arthritis    Asthma    Back pain    Colon polyps    Diverticulosis    Edema, lower extremity    GERD (gastroesophageal reflux disease)    HLD (hyperlipidemia)    IBS (irritable bowel syndrome)    Joint pain    Lactose intolerance    Osteoarthritis    Vitamin D  deficiency    Past Surgical History:  Procedure Laterality Date   ABDOMINAL HYSTERECTOMY  2007   LAPAROSCOPY  1988   Laser. Indication: Endometriosis.   TONSILLECTOMY  1971    reports that she has never smoked. She has never used smokeless tobacco. She reports that she does not drink  alcohol and does not use drugs. family history includes Anxiety disorder in her mother; Breast cancer in her paternal aunt and paternal grandmother; Colon polyps in her father and mother; Heart disease in her father, maternal grandfather, maternal grandmother, mother, and paternal grandfather; High Cholesterol in her father; High blood pressure in her father and mother; Osteoarthritis in her mother; Prostate cancer in her paternal grandfather; Rheum arthritis in her mother; Stroke in her father. Allergies  Allergen Reactions   Lactose Intolerance (Gi)     Vomiting and diarrhea    Penicillin G Hives   Erythromycin Rash   Penicillins Hives   Sulfa Antibiotics Rash      Outpatient Encounter Medications as of 02/17/2024  Medication Sig   AMBULATORY NON FORMULARY MEDICATION Allergy Shots 2 shots per week   azelastine  (OPTIVAR ) 0.05 % ophthalmic solution 2 (two) times daily.   Azelastine -Fluticasone  137-50 MCG/ACT SUSP PLACE 1 SPRAY INTO THE NOSE EVERY 12 (TWELVE) HOURS. Nasal for 30 Days   Cholecalciferol  (VITAMIN D3) 25 MCG (1000 UT) CAPS Take 4 capsules by mouth daily. 2000IU/daily   Eluxadoline  (VIBERZI ) 75 MG TABS Take 1 tablet (75 mg total) by mouth 2 (two) times daily.   EPINEPHrine 0.3 mg/0.3 mL IJ SOAJ injection SMARTSIG:IM As Directed PRN   Evolocumab  (REPATHA  Americus) Inject into the skin.   fexofenadine (ALLEGRA) 180 MG tablet Take 180 mg by mouth daily.   itraconazole (SPORANOX) 10 MG/ML solution Take 10 mLs by mouth daily. Medication is  not oral, it is a topical application.   NEXLETOL  180 MG TABS TAKE 1 TABLET BY MOUTH EVERY DAY   ondansetron  (ZOFRAN -ODT) 4 MG disintegrating tablet Take 1 tablet (4 mg total) by mouth every 8 (eight) hours as needed for nausea or vomiting.   No facility-administered encounter medications on file as of 02/17/2024.    REVIEW OF SYSTEMS  : All other systems reviewed and negative except where noted in the History of Present Illness.   PHYSICAL  EXAM: BP 122/60 (BP Location: Right Arm, Patient Position: Sitting, Cuff Size: Normal)   Pulse 74   Ht 5' 1 (1.549 m)   Wt 151 lb (68.5 kg)   BMI 28.53 kg/m  General: Well developed white female in no acute distress Head: Normocephalic and atraumatic Eyes:  Sclerae anicteric, conjunctiva pink. Ears: Normal auditory acuity Lungs: Clear throughout to auscultation; no W/R/R. Heart: Regular rate and rhythm; no M/R/G. Abdomen: Soft, non-distended.  BS present.  Non-tender. Rectal:  Will be done at the time of colonoscopy. Musculoskeletal: Symmetrical with no gross deformities  Neurological: Alert oriented x 4, grossly non-focal Psychological:  Alert and cooperative. Normal mood and affect  ASSESSMENT AND PLAN: *Left lower quadrant abdominal pain: Had significant left lower quadrant abdominal pain a couple of weeks ago.  CT scan did not show diverticulitis.  No explanation for abdominal pain seen on CT scan.  Possibly symptomatic diverticulosis/intestinal spasm related to her IBS.  Last colonoscopy was in 2017 so not unreasonable to go ahead and repeat that.  Will schedule with Dr. Nandigam.  I am going to also prescribe Levsin  0.125 mg sublingual for her to use as needed for abdominal pain.  The risks, benefits, and alternatives to colonoscopy were discussed with the patient and she consents to proceed.  *IBS-D: Takes Viberzi  75 mg daily. *Lactose intolerance   CC:  Allwardt, Deleta Felix, PA-C

## 2024-02-29 LAB — NMR, LIPOPROFILE
Cholesterol, Total: 144 mg/dL (ref 100–199)
HDL Particle Number: 42.1 umol/L (ref >=30.5)
HDL-C: 56 mg/dL (ref >39)
LDL Particle Number: 710 nmol/L (ref <1000)
LDL Size: 21.3 nm (ref >20.5)
LDL-C (NIH Calc): 73 mg/dL (ref 0–99)
LP-IR Score: 40 (ref <=45)
Small LDL Particle Number: 357 nmol/L (ref <=527)
Triglycerides: 77 mg/dL (ref 0–149)

## 2024-03-01 ENCOUNTER — Ambulatory Visit (HOSPITAL_BASED_OUTPATIENT_CLINIC_OR_DEPARTMENT_OTHER): Payer: Self-pay | Admitting: Internal Medicine

## 2024-03-01 ENCOUNTER — Other Ambulatory Visit

## 2024-03-01 MED ORDER — DICYCLOMINE HCL 10 MG PO CAPS: 10.0000 mg | ORAL_CAPSULE | Freq: Three times a day (TID) | ORAL | 1 refills | Status: DC | PRN

## 2024-03-01 NOTE — Addendum Note (Signed)
 Addended by: NICHOLAUS JARVIS on: 03/01/2024 10:43 AM   Modules accepted: Orders

## 2024-03-02 ENCOUNTER — Encounter: Admitting: Internal Medicine

## 2024-03-05 ENCOUNTER — Telehealth: Payer: Self-pay

## 2024-03-05 ENCOUNTER — Other Ambulatory Visit (HOSPITAL_COMMUNITY): Payer: Self-pay

## 2024-03-05 NOTE — Telephone Encounter (Signed)
 Pharmacy Patient Advocate Encounter   Received notification from CoverMyMeds that prior authorization for Dicyclomine  HCl 10MG  capsules is required/requested.   Insurance verification completed.   The patient is insured through [facility] .   Per test claim: The current 10 day co-pay is, $0.26.  No PA needed at this time. This test claim was processed through [facility]- copay amounts may vary at other pharmacies due to pharmacy/plan contracts, or as the patient moves through the different stages of their insurance plan.

## 2024-03-06 NOTE — Progress Notes (Unsigned)
 Cardiology Office Note   Date:  03/07/2024  ID:  Cleopatra Sardo, DOB [birth year], MRN [id number] PCP: Allwardt, Mardy HERO, PA-C  [facility] Providers Cardiologist:  None     PMH Dyslipidemia Family history early CAD Mother had MI age 59 Father died from stroke age 61 High cholesterol in both parents IBS Coronary artery calcification CT Calcium  score 03/16/22 CAC score 17 (83rd percentile) LM 0, LAD 17, LCx 0, RCA 0 Elevated LP(a) = 85.8 nmol/L  Referred to Advanced Lipid Disorders clinic and seen by Dr. Mona 03/05/2022.  Family history of early CAD including mother he had MI at age 38, both parents history of high cholesterol, father died of stroke suddenly at age 67.  At time of referral, total cholesterol 211, HDL 55, triglycerides 77, and LDL 142.  She could not tolerate statin due to significant myalgias.  Aortic atherosclerosis noted on abdominal imaging.  Her LDL improved from 1 42-85 and LDL particle number at 952 on Nexletol . Repatha  was added for lipid lowering therapy.  At last clinic visit 01/25/2023 with Dr. Mona, LP(a) improved from 85.8 to 51.1 nmol/L.  LDL particle number < 300 with LDL-C of 29, HDL-C of 55, triglycerides 77, and total cholesterol 100.  Recommendation to continue Nexletol  and Repatha  and return in 6 months for follow-up.  History of Present Illness Discussed the use of AI scribe software for clinical note transcription with the patient, who gave verbal consent to proceed.  History of Present Illness Mirah Nevins is a very pleasant 58 year old female who presents for follow-up of hyperlipidemia. She is accompanied by her daughter, Schuyler. She has experienced some concerning side effects from Repatha , including redness and itching at the injection site and gastrointestinal symptoms. These symptoms occurred recently, although she has been on Repatha  since 06/2022. Her lipoprotein(a) levels have decreased from the 80s to  51, which places her, but she could not tolerate the GI side effects she felt were exacerbated by her Repatha .  She is lactose intolerant and has history of diverticulitis and IBS, with a recent episode suggestive of diverticulosis, though a CT scan was negative. A bland liquid diet improved her symptoms. Her family history includes her father who died of a hemorrhagic stroke at 63, and her mother had a heart attack at 72 but is currently 96 and well. She is concerned about her cardiovascular risk. She admits she could modify lifestyle to improve her eating habits as she has been eating more chips, and fried particularly recently. She and her husband have been patients at [facility] and have been eating a healthier diet for the last few years. She has not been walking recently due to gastrointestinal issues but has access to a recumbent bike and treadmill at home. She denies chest pain, shortness of breath, edema, fatigue, palpitations, weakness, presyncope, syncope, orthopnea, and PND.   ROS: See HPI  Studies Reviewed EKG Interpretation Date/Time:  Wednesday March 07 2024 11:33:49 EDT Ventricular Rate:  83 PR Interval:  130 QRS Duration:  88 QT Interval:  356 QTC Calculation: 418 R Axis:   72  Text Interpretation: Normal sinus rhythm Nonspecific T wave abnormality No previous ECGs available Confirmed by Percy Browning [phone]) on 03/07/2024 12:04:22 PM     Lipoprotein (a)  Date/Time Value Ref Range Status  01/18/2023 03:35 PM 51.1 <75.0 nmol/L Final    Comment:    Note:  Values greater than or equal to 75.0 nmol/L may  indicate an independent risk factor for CHD,        but must be evaluated with caution when applied        to non-Caucasian populations due to the        influence of genetic factors on Lp(a) across        ethnicities.     Risk Assessment/Calculations           Physical Exam VS:  BP (!) 92/58   Pulse 83   Ht 5' 1.5 (1.562 m)   Wt 148 lb 6.4  oz (67.3 kg)   SpO2 98%   BMI 27.59 kg/m    Wt Readings from Last 3 Encounters:  03/07/24 148 lb 6.4 oz (67.3 kg)  02/17/24 151 lb (68.5 kg)  02/08/24 145 lb (65.8 kg)    GEN: Well nourished, well developed in no acute distress NECK: No JVD; No carotid bruits CARDIAC: RRR, no murmurs, rubs, gallops RESPIRATORY:  Clear to auscultation without rales, wheezing or rhonchi  ABDOMEN: Soft, non-tender, non-distended EXTREMITIES:  No edema; No deformity    Assessment & Plan Elevated Lipoprotein(a) Hyperlipidemia LDL goal    Lipid panel completed 02/24/2024 with total cholesterol 144, HDL 56, LDL 73, and triglycerides 77.  Lp(a) levels have been reduced from over 80 to 51 with Repatha . She is having worsening GI side effects and site irritation with the last 2 injections of Repatha . She tolerated the medication well since 06/2022 until early 2025. She is agreeable to start Praluent 75 mg every two weeks, alternating injection sites. Monitor for side effects like nasal congestion or pharyngitis. Repeat lipid profile in three months to assess efficacy.  No concerning side effects from Nexletol , which we will continue.  Coronary artery calcification Nonspecific ST abnormality   CAC score of 17 on 03/16/2022 placing her in the 83rd percentile. EKG shows a nonspecific ST abnormality without symptoms such as chest pain or dyspnea. Similar findings were present in the 2022 EKG. No indication for further ischemia evaluation at this time.   Irritable Bowel Syndrome (IBS) Diverticulosis Lactose intolerance   Diagnosed with IBS in 2006, recent GI symptoms she felt may have been exacerbated by Repatha .  Lactose is listed as a component of Praluent, so we will try this medication.  No acute concerns today.  Management per GI/PCP.         Dispo: will follow-up based on tolerance/repeat lab results  Signed, Rosaline Bane, NP-C

## 2024-03-07 ENCOUNTER — Telehealth: Payer: Self-pay | Admitting: Pharmacist Clinician (PhC)/ Clinical Pharmacy Specialist

## 2024-03-07 ENCOUNTER — Other Ambulatory Visit (HOSPITAL_BASED_OUTPATIENT_CLINIC_OR_DEPARTMENT_OTHER): Payer: Self-pay | Admitting: Nurse Practitioner

## 2024-03-07 ENCOUNTER — Encounter (HOSPITAL_BASED_OUTPATIENT_CLINIC_OR_DEPARTMENT_OTHER): Payer: Self-pay | Admitting: Nurse Practitioner

## 2024-03-07 ENCOUNTER — Ambulatory Visit (INDEPENDENT_AMBULATORY_CARE_PROVIDER_SITE_OTHER): Admitting: Nurse Practitioner

## 2024-03-07 VITALS — BP 92/58 | HR 83 | Ht 61.5 in | Wt 148.4 lb

## 2024-03-07 DIAGNOSIS — R9431 Abnormal electrocardiogram [ECG] [EKG]: Secondary | ICD-10-CM

## 2024-03-07 DIAGNOSIS — I700 Atherosclerosis of aorta: Secondary | ICD-10-CM | POA: Diagnosis not present

## 2024-03-07 DIAGNOSIS — K5790 Diverticulosis of intestine, part unspecified, without perforation or abscess without bleeding: Secondary | ICD-10-CM

## 2024-03-07 DIAGNOSIS — E7841 Elevated Lipoprotein(a): Secondary | ICD-10-CM

## 2024-03-07 DIAGNOSIS — E785 Hyperlipidemia, unspecified: Secondary | ICD-10-CM | POA: Diagnosis not present

## 2024-03-07 DIAGNOSIS — K580 Irritable bowel syndrome with diarrhea: Secondary | ICD-10-CM

## 2024-03-07 DIAGNOSIS — I2510 Atherosclerotic heart disease of native coronary artery without angina pectoris: Secondary | ICD-10-CM

## 2024-03-07 DIAGNOSIS — E739 Lactose intolerance, unspecified: Secondary | ICD-10-CM

## 2024-03-07 MED ORDER — PRALUENT 75 MG/ML ~~LOC~~ SOAJ: 75.0000 mg | SUBCUTANEOUS | 2 refills | Status: DC

## 2024-03-07 NOTE — Patient Instructions (Signed)
 Medication Instructions:   START Praulent Inject 1 mL (75 mg total) into the skin every 14 (fourteen) days. - Subcutaneous   *If you need a refill on your cardiac medications before your next appointment, please call your pharmacy*  Lab Work:  Your physician recommends that you return for a FASTING NMR, fasting after midnight, in October. Patient given paperwork today.   If you have labs (blood work) drawn today and your tests are completely normal, you will receive your results only by: MyChart Message (if you have MyChart) OR A paper copy in the mail If you have any lab test that is abnormal or we need to change your treatment, we will call you to review the results.  Testing/Procedures:  None ordered.  Follow-Up: At [facility], you and your health needs are our priority.  As part of our continuing mission to provide you with exceptional heart care, our providers are all part of one team.  This team includes your primary Cardiologist (physician) and Advanced Practice Providers or APPs (Physician Assistants and Nurse Practitioners) who all work together to provide you with the care you need, when you need it.  Your next appointment:   To be determine upon lab results.

## 2024-03-07 NOTE — Telephone Encounter (Signed)
 Switched to [employer].

## 2024-03-14 ENCOUNTER — Encounter (INDEPENDENT_AMBULATORY_CARE_PROVIDER_SITE_OTHER): Payer: Self-pay | Admitting: Adult Health

## 2024-03-14 ENCOUNTER — Ambulatory Visit (INDEPENDENT_AMBULATORY_CARE_PROVIDER_SITE_OTHER): Admitting: Adult Health

## 2024-03-14 VITALS — BP 103/67 | HR 83 | Temp 98.6°F | Ht 61.5 in | Wt 145.0 lb

## 2024-03-14 DIAGNOSIS — E559 Vitamin D deficiency, unspecified: Secondary | ICD-10-CM

## 2024-03-14 DIAGNOSIS — E669 Obesity, unspecified: Secondary | ICD-10-CM

## 2024-03-14 DIAGNOSIS — R7303 Prediabetes: Secondary | ICD-10-CM | POA: Diagnosis not present

## 2024-03-14 DIAGNOSIS — K5790 Diverticulosis of intestine, part unspecified, without perforation or abscess without bleeding: Secondary | ICD-10-CM | POA: Diagnosis not present

## 2024-03-14 DIAGNOSIS — Z6827 Body mass index (BMI) 27.0-27.9, adult: Secondary | ICD-10-CM

## 2024-03-14 DIAGNOSIS — E739 Lactose intolerance, unspecified: Secondary | ICD-10-CM | POA: Diagnosis not present

## 2024-03-14 DIAGNOSIS — E66811 Obesity, class 1: Secondary | ICD-10-CM

## 2024-03-14 NOTE — Progress Notes (Signed)
 WEIGHT SUMMARY AND BIOMETRICS  Vitals Temp: 98.6 F (37 C) BP: 103/67 Pulse Rate: 83 SpO2: 94 %   Anthropometric Measurements Height: 5' 1.5 (1.562 m) Weight: 145 lb (65.8 kg) BMI (Calculated): 26.96 Weight at Last Visit: 145 lb Weight Lost Since Last Visit: 0 Weight Gained Since Last Visit: 0 Starting Weight: 170 lb Total Weight Loss (lbs): 25 lb (11.3 kg)   Body Composition  Body Fat %: 36.9 % Fat Mass (lbs): 53.6 lbs Muscle Mass (lbs): 87 lbs Total Body Water (lbs): 63.2 lbs Visceral Fat Rating : 8   Other Clinical Data RMR: 1310 Fasting: no Labs: no Today's Visit #: 40 Starting Date: 07/07/21    Chief Complaint:   OBESITY Kim Clements is here to discuss her progress with her obesity treatment plan.  She is on the the Category 1 Plan and states she is following her eating plan approximately 80 % of the time.  She states she is exercising: NEAT Activities   Interim History:  Since last OV at [facility] on 02/08/2024, following events have occurred in the last 4 weeks: Multiple OVs with established allergist for routine allergy injections 02/17/2024 OV with Established GI Provider- Rx Change 02/28/2024 OV with Podiatry  03/07/2024 OV with Established Cards Provider- Rx Change Cards OV Notes-  Irritable Bowel Syndrome (IBS) Diverticulosis Lactose intolerance   Diagnosed with IBS in 2006, recent GI symptoms she felt may have been exacerbated by Repatha .  Lactose is listed as a component of Praluent , so we will try this medication.  No acute concerns today.  Management per GI/PCP.  Medication Instructions:    START Praulent Inject 1 mL (75 mg total) into the skin every 14 (fourteen) days. - Subcutaneous   She has yet to start new GI and Cards medication- awaiting on insurance approval  Reviewed Bioimpedance results with pt: Muscle Mass: +1.4 lbs Adipose Mass: -18 lbs  Of note- Husband at [facility] during OV  Subjective:   1. Lactose intolerance 02/17/2024  [facility] OV Notes: HISTORY OF PRESENT ILLNESS: This is a 59 year old female who is a patient of Dr. Trenna.  She has IBS-D and lactose intolerance.  She takes Viberzi  75 mg daily for her IBS symptoms.  Is here on this occasion for follow-up of her recent ED visit.  He was evaluated on 5/31 with complaints of severe left lower quadrant abdominal pain.  CT scan was unremarkable, showed diverticulosis without diverticulitis.  Labs unremarkable.  Was told to follow-up with her gastroenterologist and consider repeat colonoscopy.  She says that the pain has significantly improved.  She says that really just yesterday and today were the first days that she feels much better.  She has had treatment empirically for diverticulitis remotely, several years ago, but unaware of any actual CT/radiographic evidence of diverticulitis.   Colonoscopy February 12, 2016 showed sigmoid diverticulosis internal hemorrhoids otherwise normal exam   Colonoscopy Jan 09, 2010 by Dr. Rosalie at [facility] sigmoid diverticulosis otherwise normal exam   Colonoscopy October 20 2004 by Dr. [facility]alie [facility].  Terminal ileum biopsies normal mucosa.  2 small polypoid appearing polyps were removed with biopsy forceps showed benign colonic mucosa.  Sigmoid diverticulosis ASSESSMENT AND PLAN: *Left lower quadrant abdominal pain: Had significant left lower quadrant abdominal pain a couple of weeks ago.  CT scan did not show diverticulitis.  No explanation for abdominal pain seen on CT scan.  Possibly symptomatic diverticulosis/intestinal spasm related to her IBS.  Last colonoscopy was in 2017 so  not unreasonable to go ahead and repeat that.  Will schedule with Dr. Nandigam.  I am going to also prescribe Levsin  0.125 mg sublingual for her to use as needed for abdominal pain.  The risks, benefits, and alternatives to colonoscopy were discussed with the patient and she consents to proceed.  *IBS-D: Takes Viberzi  75 mg daily. *Lactose intolerance   2.  Prediabetes Lab Results  Component Value Date   HGBA1C 5.6 11/30/2023   HGBA1C 5.5 07/27/2023   HGBA1C 5.4 04/05/2023    A1c stable without antidiabetic medications She has been reducing snacking on chips and avoiding fried foods that last several weeks  3. Vitamin D  deficiency  Latest Reference Range & Units 04/05/23 10:20 07/27/23 11:24 11/30/23 12:15  Vitamin D , 25-Hydroxy 30.0 - 100.0 ng/mL 49.4 60.1 64.6   She is on OTC Vit D 3 1,000 international units 4 tabs per day = 4,000 international units   4. Diverticulosis 02/17/2024 [facility] OV Notes: HISTORY OF PRESENT ILLNESS: This is a 58 year old female who is a patient of Dr. Trenna.  She has IBS-D and lactose intolerance.  She takes Viberzi  75 mg daily for her IBS symptoms.  Is here on this occasion for follow-up of her recent ED visit.  He was evaluated on 5/31 with complaints of severe left lower quadrant abdominal pain.  CT scan was unremarkable, showed diverticulosis without diverticulitis.  Labs unremarkable.  Was told to follow-up with her gastroenterologist and consider repeat colonoscopy.  She says that the pain has significantly improved.  She says that really just yesterday and today were the first days that she feels much better.  She has had treatment empirically for diverticulitis remotely, several years ago, but unaware of any actual CT/radiographic evidence of diverticulitis.   Colonoscopy February 12, 2016 showed sigmoid diverticulosis internal hemorrhoids otherwise normal exam   Colonoscopy Jan 09, 2010 by Dr. Rosalie at [facility] sigmoid diverticulosis otherwise normal exam   Colonoscopy October 20 2004 by Dr. [facility]alie [facility].  Terminal ileum biopsies normal mucosa.  2 small polypoid appearing polyps were removed with biopsy forceps showed benign colonic mucosa.  Sigmoid diverticulosis ASSESSMENT AND PLAN: *Left lower quadrant abdominal pain: Had significant left lower quadrant abdominal pain a couple of weeks ago.  CT  scan did not show diverticulitis.  No explanation for abdominal pain seen on CT scan.  Possibly symptomatic diverticulosis/intestinal spasm related to her IBS.  Last colonoscopy was in 2017 so not unreasonable to go ahead and repeat that.  Will schedule with Dr. Nandigam.  I am going to also prescribe Levsin  0.125 mg sublingual for her to use as needed for abdominal pain.  The risks, benefits, and alternatives to colonoscopy were discussed with the patient and she consents to proceed.  *IBS-D: Takes Viberzi  75 mg daily. *Lactose intolerance  Assessment/Plan:   1. Lactose intolerance (Primary) Avoid known trigger foods Continue GI Rx per specialist directions  2. Prediabetes Continue to increase protein and limit sugar/simple CHO intake INCREASE CARDIOVASCULAR EXERCISE  3. Vitamin D  deficiency Continue current OTC supplementation  4. Diverticulosis Avoid known trigger foods Continue GI Rx per specialist directions  5. Obesity, current BMI 27.0  Kim Clements is currently in the action stage of change. As such, her goal is to continue with weight loss efforts. She has agreed to the Category 1 Plan.   Exercise goals: All adults should avoid inactivity. Some physical activity is better than none, and adults who participate in any amount of physical activity gain some  health benefits. Adults should also include muscle-strengthening activities that involve all major muscle groups on 2 or more days a week. Cardiovascular exercise at least 2 x weekly  Behavioral modification strategies: increasing lean protein intake, decreasing simple carbohydrates, increasing vegetables, increasing water intake, no skipping meals, meal planning and cooking strategies, keeping healthy foods in the home, ways to avoid boredom eating, and planning for success.  Kim Clements has agreed to follow-up with our clinic in 4 weeks. She was informed of the importance of frequent follow-up visits to maximize her success  with intensive lifestyle modifications for her multiple health conditions.   Objective:   Blood pressure 103/67, pulse 83, temperature 98.6 F (37 C), height 5' 1.5 (1.562 m), weight 145 lb (65.8 kg), SpO2 94%. Body mass index is 26.95 kg/m.  General: Cooperative, alert, well developed, in no acute distress. HEENT: Conjunctivae and lids unremarkable. Cardiovascular: Regular rhythm.  Lungs: Normal work of breathing. Neurologic: No focal deficits.   Lab Results  Component Value Date   CREATININE 0.83 02/04/2024   BUN 17 02/04/2024   NA 141 02/04/2024   K 4.3 02/04/2024   CL 106 02/04/2024   CO2 23 02/04/2024   Lab Results  Component Value Date   ALT 14 02/04/2024   AST 17 02/04/2024   ALKPHOS 47 02/04/2024   BILITOT 0.6 02/04/2024   Lab Results  Component Value Date   HGBA1C 5.6 11/30/2023   HGBA1C 5.5 07/27/2023   HGBA1C 5.4 04/05/2023   HGBA1C 5.5 11/08/2022   HGBA1C 5.7 (H) 06/17/2022   Lab Results  Component Value Date   INSULIN  4.7 11/30/2023   INSULIN  4.3 07/27/2023   INSULIN  4.9 04/05/2023   INSULIN  4.7 11/08/2022   INSULIN  3.1 06/17/2022   Lab Results  Component Value Date   TSH 1.610 11/30/2023   Lab Results  Component Value Date   CHOL 103 11/30/2023   HDL 59 11/30/2023   LDLCALC 30 11/30/2023   TRIG 66 11/30/2023   CHOLHDL 1.7 11/30/2023   Lab Results  Component Value Date   VD25OH 64.6 11/30/2023   VD25OH 60.1 07/27/2023   VD25OH 49.4 04/05/2023   Lab Results  Component Value Date   WBC 7.8 02/04/2024   HGB 14.3 02/04/2024   HCT 41.3 02/04/2024   MCV 88.1 02/04/2024   PLT 323 02/04/2024   No results found for: IRON, TIBC, FERRITIN  Attestation Statements:   Reviewed by clinician on day of visit: allergies, medications, problem list, medical history, surgical history, family history, social history, and previous encounter notes.  Time spent on visit including pre-visit chart review and post-visit care and charting was 45  minutes.    Due to seeing both her and her husband during same OV- extra time is needed to chart review, pre-chart, and manage care   I have reviewed the above documentation for accuracy and completeness, and I agree with the above. -  Kayla Deshaies d. Liat Mayol, NP-C

## 2024-03-19 ENCOUNTER — Ambulatory Visit: Admitting: Physician Assistant

## 2024-03-19 ENCOUNTER — Other Ambulatory Visit: Payer: Self-pay | Admitting: Internal Medicine

## 2024-03-21 ENCOUNTER — Telehealth: Payer: Self-pay | Admitting: Nurse Practitioner

## 2024-03-21 DIAGNOSIS — E785 Hyperlipidemia, unspecified: Secondary | ICD-10-CM

## 2024-03-21 DIAGNOSIS — I700 Atherosclerosis of aorta: Secondary | ICD-10-CM

## 2024-03-21 DIAGNOSIS — I2510 Atherosclerotic heart disease of native coronary artery without angina pectoris: Secondary | ICD-10-CM

## 2024-03-21 DIAGNOSIS — E7841 Elevated Lipoprotein(a): Secondary | ICD-10-CM

## 2024-03-21 NOTE — Telephone Encounter (Signed)
*  STAT* If patient is at the pharmacy, call can be transferred to refill team.   1. Which medications need to be refilled? (please list name of each medication and dose if known) Alirocumab  (PRALUENT ) 75 MG/ML SOAJ  NEXLETOL  180 MG TABS   2. Which pharmacy/location (including street and city if local pharmacy) is medication to be sent to?  [id number] - [place], [place] - [address]      3. Do they need a 30 day or 90 day supply? 90 day    Pt is out of medication/ new prescription was never recieved

## 2024-03-22 ENCOUNTER — Other Ambulatory Visit: Payer: Self-pay | Admitting: Nurse Practitioner

## 2024-03-22 DIAGNOSIS — I700 Atherosclerosis of aorta: Secondary | ICD-10-CM

## 2024-03-22 DIAGNOSIS — I2510 Atherosclerotic heart disease of native coronary artery without angina pectoris: Secondary | ICD-10-CM

## 2024-03-22 DIAGNOSIS — E7841 Elevated Lipoprotein(a): Secondary | ICD-10-CM

## 2024-03-22 DIAGNOSIS — E785 Hyperlipidemia, unspecified: Secondary | ICD-10-CM

## 2024-03-22 MED ORDER — PRALUENT 75 MG/ML ~~LOC~~ SOAJ
75.0000 mg | SUBCUTANEOUS | 2 refills | Status: DC
Start: 2024-03-22 — End: 2024-04-02

## 2024-03-22 MED ORDER — NEXLETOL 180 MG PO TABS: 1.0000 | ORAL_TABLET | Freq: Every day | ORAL | 0 refills | Status: DC

## 2024-03-23 ENCOUNTER — Other Ambulatory Visit: Payer: Self-pay | Admitting: Nurse Practitioner

## 2024-03-23 ENCOUNTER — Encounter (HOSPITAL_BASED_OUTPATIENT_CLINIC_OR_DEPARTMENT_OTHER): Payer: Self-pay

## 2024-03-23 DIAGNOSIS — E7841 Elevated Lipoprotein(a): Secondary | ICD-10-CM

## 2024-03-23 DIAGNOSIS — I700 Atherosclerosis of aorta: Secondary | ICD-10-CM

## 2024-03-23 DIAGNOSIS — I2510 Atherosclerotic heart disease of native coronary artery without angina pectoris: Secondary | ICD-10-CM

## 2024-03-23 DIAGNOSIS — E785 Hyperlipidemia, unspecified: Secondary | ICD-10-CM

## 2024-03-27 ENCOUNTER — Other Ambulatory Visit (HOSPITAL_COMMUNITY): Payer: Self-pay

## 2024-03-27 NOTE — Telephone Encounter (Signed)
 Sure, what strength did you want?

## 2024-03-28 ENCOUNTER — Other Ambulatory Visit

## 2024-03-28 ENCOUNTER — Telehealth: Payer: Self-pay | Admitting: Pharmacy Technician

## 2024-03-28 ENCOUNTER — Other Ambulatory Visit (HOSPITAL_COMMUNITY): Payer: Self-pay

## 2024-03-28 NOTE — Telephone Encounter (Addendum)
 From pt advice  Pharmacy Patient Advocate Encounter   Received notification from advice req that prior authorization for praluent  is required/requested.   Insurance verification completed.   The patient is insured through [facility] .   Per test claim: PA required; PA submitted to above mentioned insurance via latent Key/confirmation #/EOC [id number] Status is pending

## 2024-03-29 NOTE — Telephone Encounter (Signed)
 Pharmacy Patient Advocate Encounter  Received notification from [facility] that Prior Authorization for praluent  75mg  has been APPROVED from 02/27/24 to 03/28/25   PA #/Case ID/Reference #:

## 2024-04-02 MED ORDER — PRALUENT 75 MG/ML ~~LOC~~ SOAJ: 75.0000 mg | SUBCUTANEOUS | 2 refills | Status: DC

## 2024-04-11 ENCOUNTER — Ambulatory Visit (INDEPENDENT_AMBULATORY_CARE_PROVIDER_SITE_OTHER): Admitting: Adult Health

## 2024-04-25 ENCOUNTER — Encounter: Payer: Self-pay | Admitting: Gastroenterology

## 2024-05-03 ENCOUNTER — Encounter: Admitting: Gastroenterology

## 2024-05-04 ENCOUNTER — Encounter (HOSPITAL_BASED_OUTPATIENT_CLINIC_OR_DEPARTMENT_OTHER): Payer: Self-pay

## 2024-05-10 ENCOUNTER — Encounter (INDEPENDENT_AMBULATORY_CARE_PROVIDER_SITE_OTHER): Payer: Self-pay | Admitting: Adult Health

## 2024-05-10 ENCOUNTER — Ambulatory Visit (INDEPENDENT_AMBULATORY_CARE_PROVIDER_SITE_OTHER): Admitting: Adult Health

## 2024-05-10 VITALS — BP 117/71 | HR 74 | Temp 98.6°F | Ht 61.0 in | Wt 144.0 lb

## 2024-05-10 DIAGNOSIS — Z6827 Body mass index (BMI) 27.0-27.9, adult: Secondary | ICD-10-CM

## 2024-05-10 DIAGNOSIS — F411 Generalized anxiety disorder: Secondary | ICD-10-CM

## 2024-05-10 DIAGNOSIS — E559 Vitamin D deficiency, unspecified: Secondary | ICD-10-CM | POA: Diagnosis not present

## 2024-05-10 DIAGNOSIS — E669 Obesity, unspecified: Secondary | ICD-10-CM

## 2024-05-10 DIAGNOSIS — E739 Lactose intolerance, unspecified: Secondary | ICD-10-CM | POA: Diagnosis not present

## 2024-05-10 DIAGNOSIS — E66811 Obesity, class 1: Secondary | ICD-10-CM

## 2024-05-10 DIAGNOSIS — R7303 Prediabetes: Secondary | ICD-10-CM

## 2024-05-10 NOTE — Progress Notes (Signed)
 WEIGHT SUMMARY AND BIOMETRICS  Vitals Temp: 98.6 F (37 C) BP: 117/71 Pulse Rate: 74 SpO2: 100 %   Anthropometric Measurements Height: 5' 1 (1.549 m) Weight: 144 lb (65.3 kg) BMI (Calculated): 27.22 Weight at Last Visit: 145lb Weight Lost Since Last Visit: 1lb Weight Gained Since Last Visit: 0lb Starting Weight: 170lb Total Weight Loss (lbs): 26 lb (11.8 kg)   Body Composition  Body Fat %: 37.4 % Fat Mass (lbs): 54.2 lbs Muscle Mass (lbs): 86 lbs Total Body Water (lbs): 61.8 lbs Visceral Fat Rating : 9   Other Clinical Data RMR: 1310 Fasting: No Labs: no Today's Visit #: 41 Starting Date: 07/07/21    Chief Complaint:   OBESITY Kim Clements is here to discuss her progress with her obesity treatment plan.  She is on the the Category 1 Plan and states she is following her eating plan approximately 80 % of the time.  She states she is exercising 20-40 minutes 4 times per week.   Interim History:  She and her husband have made a concerted effort to increase walking duration and intensity.  Reviewed Bioimpedance results with pt: Muscle Mass: - 1 lb Adipose Mass: + 0.6 lb  She has received Praluent  Rx- she has yet to start new medication. She is concerned that her weekly allergy injections will interact with bi-weekly Praluent  75mg   PA approved for Praluent  through 03/28/2025  She has contacted Cards with her concerns and she was advised to space out allergy injection and Praluent  injection by at least 1-3 days.   Of note- Husband at [facility] during OV  Subjective:   1. Lactose intolerance 02/17/2024 [facility] OV Notes: HISTORY OF PRESENT ILLNESS: This is a 58 year old female who is a patient of Dr. Trenna.  She has IBS-D and lactose intolerance.  She takes Viberzi  75 mg daily for her IBS symptoms.  Is here on this occasion for follow-up of her recent ED visit.  He was evaluated on 5/31 with complaints of severe left lower quadrant abdominal pain.   CT scan was unremarkable, showed diverticulosis without diverticulitis.  Labs unremarkable.  Was told to follow-up with her gastroenterologist and consider repeat colonoscopy.  She says that the pain has significantly improved.  She says that really just yesterday and today were the first days that she feels much better.  She has had treatment empirically for diverticulitis remotely, several years ago, but unaware of any actual CT/radiographic evidence of diverticulitis  ASSESSMENT AND PLAN: *Left lower quadrant abdominal pain: Had significant left lower quadrant abdominal pain a couple of weeks ago.  CT scan did not show diverticulitis.  No explanation for abdominal pain seen on CT scan.  Possibly symptomatic diverticulosis/intestinal spasm related to her IBS.  Last colonoscopy was in 2017 so not unreasonable to go ahead and repeat that.  Will schedule with Dr. Nandigam.  I am going to also prescribe Levsin  0.125 mg sublingual for her to use as needed for abdominal pain.  The risks, benefits, and alternatives to colonoscopy were discussed with the patient and she consents to proceed.  *IBS-D: Takes Viberzi  75 mg daily. *Lactose intolerance  2. Vitamin D  deficiency  Latest Reference Range & Units 04/05/23 10:20 07/27/23 11:24 11/30/23 12:15  Vitamin D , 25-Hydroxy 30.0 - 100.0 ng/mL 49.4 60.1 64.6   She is taking OTC Vit D3 1000 international units  She is taking 2 tabs daily = 2000 international units   3. Prediabetes Lab Results  Component Value Date   HGBA1C 5.6  11/30/2023   HGBA1C 5.5 07/27/2023   HGBA1C 5.4 04/05/2023    A1c high normal She is not on any antidiabetic medication at present  4. GAD (generalized anxiety disorder) PDMP reviewed-No medications listed for > 2 years She is not currently on any mental health medications  Assessment/Plan:   1. Lactose intolerance (Primary) Continue to avoid known trigger foods Continue f/u with GI as directed and PRN  2. Vitamin D   deficiency Continue OTC Vit D supplementation  3. Prediabetes Increase lean protein and limit simple CHO and sugar If snacking over 100 calories, try to expend energy (ie exercise) to account for the calorie overage.  4. GAD (generalized anxiety disorder) Increase regular walking Limit caffiene  5. Obesity, current BMI 27.4  Kim Clements is currently in the action stage of change. As such, her goal is to continue with weight loss efforts. She has agreed to the Category 1 Plan.   Exercise goals: All adults should avoid inactivity. Some physical activity is better than none, and adults who participate in any amount of physical activity gain some health benefits. Adults should also include muscle-strengthening activities that involve all major muscle groups on 2 or more days a week. Walking   Behavioral modification strategies: increasing lean protein intake, decreasing simple carbohydrates, increasing vegetables, increasing water intake, meal planning and cooking strategies, keeping healthy foods in the home, ways to avoid boredom eating, and planning for success.  Kim Clements has agreed to follow-up with our clinic in 4 weeks. She was informed of the importance of frequent follow-up visits to maximize her success with intensive lifestyle modifications for her multiple health conditions.   Objective:   Blood pressure 117/71, pulse 74, temperature 98.6 F (37 C), height 5' 1 (1.549 m), weight 144 lb (65.3 kg), SpO2 100%. Body mass index is 27.21 kg/m.  General: Cooperative, alert, well developed, in no acute distress. HEENT: Conjunctivae and lids unremarkable. Cardiovascular: Regular rhythm.  Lungs: Normal work of breathing. Neurologic: No focal deficits.   Lab Results  Component Value Date   CREATININE 0.83 02/04/2024   BUN 17 02/04/2024   NA 141 02/04/2024   K 4.3 02/04/2024   CL 106 02/04/2024   CO2 23 02/04/2024   Lab Results  Component Value Date   ALT 14 02/04/2024   AST  17 02/04/2024   ALKPHOS 47 02/04/2024   BILITOT 0.6 02/04/2024   Lab Results  Component Value Date   HGBA1C 5.6 11/30/2023   HGBA1C 5.5 07/27/2023   HGBA1C 5.4 04/05/2023   HGBA1C 5.5 11/08/2022   HGBA1C 5.7 (H) 06/17/2022   Lab Results  Component Value Date   INSULIN  4.7 11/30/2023   INSULIN  4.3 07/27/2023   INSULIN  4.9 04/05/2023   INSULIN  4.7 11/08/2022   INSULIN  3.1 06/17/2022   Lab Results  Component Value Date   TSH 1.610 11/30/2023   Lab Results  Component Value Date   CHOL 103 11/30/2023   HDL 59 11/30/2023   LDLCALC 30 11/30/2023   TRIG 66 11/30/2023   CHOLHDL 1.7 11/30/2023   Lab Results  Component Value Date   VD25OH 64.6 11/30/2023   VD25OH 60.1 07/27/2023   VD25OH 49.4 04/05/2023   Lab Results  Component Value Date   WBC 7.8 02/04/2024   HGB 14.3 02/04/2024   HCT 41.3 02/04/2024   MCV 88.1 02/04/2024   PLT 323 02/04/2024   No results found for: IRON, TIBC, FERRITIN  Attestation Statements:   Reviewed by clinician on day of visit: allergies, medications, problem  list, medical history, surgical history, family history, social history, and previous encounter notes.  Due to seeing both her and her husband during same OV- extra time is needed to chart review, pre-chart, and manage care   Time spent on visit including pre-visit chart review and post-visit care and charting was 45 minutes.   I have reviewed the above documentation for accuracy and completeness, and I agree with the above. - Modesto Ganoe d. Joeline Freer, NP-C

## 2024-06-06 ENCOUNTER — Ambulatory Visit (INDEPENDENT_AMBULATORY_CARE_PROVIDER_SITE_OTHER): Admitting: Adult Health

## 2024-06-06 ENCOUNTER — Encounter (INDEPENDENT_AMBULATORY_CARE_PROVIDER_SITE_OTHER): Payer: Self-pay | Admitting: Adult Health

## 2024-06-06 VITALS — BP 122/83 | HR 73 | Temp 98.2°F | Ht 61.0 in | Wt 144.0 lb

## 2024-06-06 DIAGNOSIS — E66811 Obesity, class 1: Secondary | ICD-10-CM

## 2024-06-06 DIAGNOSIS — R7303 Prediabetes: Secondary | ICD-10-CM | POA: Diagnosis not present

## 2024-06-06 DIAGNOSIS — E559 Vitamin D deficiency, unspecified: Secondary | ICD-10-CM | POA: Diagnosis not present

## 2024-06-06 DIAGNOSIS — F411 Generalized anxiety disorder: Secondary | ICD-10-CM | POA: Diagnosis not present

## 2024-06-06 DIAGNOSIS — E669 Obesity, unspecified: Secondary | ICD-10-CM

## 2024-06-06 DIAGNOSIS — Z6827 Body mass index (BMI) 27.0-27.9, adult: Secondary | ICD-10-CM

## 2024-06-06 NOTE — Progress Notes (Signed)
 WEIGHT SUMMARY AND BIOMETRICS  Vitals Temp: 98.2 F (36.8 C) BP: 122/83 Pulse Rate: 73 SpO2: 98 %   Anthropometric Measurements Height: 5' 1 (1.549 m) Weight: 144 lb (65.3 kg) BMI (Calculated): 27.22 Weight at Last Visit: 144lb Weight Lost Since Last Visit: 0lb Weight Gained Since Last Visit: 0lb Starting Weight: 170lb Total Weight Loss (lbs): 26 lb (11.8 kg)   Body Composition  Body Fat %: 36.9 % Fat Mass (lbs): 53.4 lbs Muscle Mass (lbs): 86.8 lbs Total Body Water (lbs): 62.4 lbs Visceral Fat Rating : 9   Other Clinical Data RMR: 1310 Fasting: No Labs: no Today's Visit #: 42 Starting Date: 07/07/21    Chief Complaint:   OBESITY Kim Clements is here to discuss her progress with her obesity treatment plan.  She is on the the Category 1 Plan and states she is following her eating plan approximately 80 % of the time.  She states she is exercising Walking 30 minutes 3 times per week.  Interim History:  Reviewed Bioempedence Results with pt: Muscle Mass:+0.8 lb Adipose Mass:-0.8 lb  Hunger/appetite-stable  Cravings- occasional sugar/sweet cravings.  She will usually share her Starbucks drinks with her husband, thus reducing calorie/CHO/fat intake.  Stress- her life is stable and without acute stress.  Exercise-she and her husband have increased regular walking.  Reviewed with her the when she routinely exercises, she increases her muscle mass and reduces adiposity.  She has not started Praluent  injections over concerns of SE, ie: URI sx's. They will have guests staying with them this weekend and she would like to defer beginning the injection until next week.   06/06/24 12:00  Height 5' 1 (1.549 m)  Weight 144 lb (65.3 kg)  BMI (Calculated) 27.22  Total Weight Loss (lbs) 26 lb (11.8 kg)   Of note- Husband at [facility] during OV   Subjective:   1. Vitamin D  deficiency She is on daily OTC VitD3 2,000 international units 2 tabs daily to equal 4,000  international units daily. She endorses stable energy levels.  2. Prediabetes She will occasionally crave sweets/simple CHO She is very mindful of portion/serving sizes when snacking. She has been increasing amount of regular walking- which resulted in increase muscle mass and reduce adiposity. She is not on any antidiabetic medications.  3. GAD (generalized anxiety disorder) She endorses stable mood. She is not currently on any antidepressants or anxiolytics. She lives with her husband and young adult daughter. The daughter is a Consulting civil engineer at [employer]- remote classes on a quarter system. 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  Assessment/Plan:   1. Vitamin D  deficiency Continue current OTC supplementation, monitor labs Q2-34M  2. Prediabetes Continue healthy eating and increasing regular exercise.  3. GAD (generalized anxiety disorder) Continue to surround self with positive family and friends. Continue to increase regular cardiovascular exercise.  4. Obesity, current BMI 27.4  Kim Clements is currently in the action stage of change. As such, her goal is to continue with weight loss efforts. She has agreed to the Category 1 Plan.   Exercise goals: All adults should avoid inactivity. Some physical activity is better than none, and adults who participate in any amount of physical activity gain some health benefits. Adults should also include muscle-strengthening activities that involve all major muscle groups on 2 or more days a week. Increase regular walking- intensity and duration.  Behavioral modification strategies: increasing lean protein intake, decreasing simple carbohydrates, increasing vegetables, increasing  water intake, decreasing liquid calories, no skipping meals, meal planning and cooking strategies, keeping healthy foods in the home, better snacking choices, and planning for success.  [name] has agreed to follow-up with our  clinic in 4 weeks. She was informed of the importance of frequent follow-up visits to maximize her success with intensive lifestyle modifications for her multiple health conditions.   Check Fasting Labs Fall 2025  Objective:   Blood pressure 122/83, pulse 73, temperature 98.2 F (36.8 C), height 5' 1 (1.549 m), weight 144 lb (65.3 kg), SpO2 98%. Body mass index is 27.21 kg/m.  General: Cooperative, alert, well developed, in no acute distress. HEENT: Conjunctivae and lids unremarkable. Cardiovascular: Regular rhythm.  Lungs: Normal work of breathing. Neurologic: No focal deficits.   Lab Results  Component Value Date   CREATININE 0.83 02/04/2024   BUN 17 02/04/2024   NA 141 02/04/2024   K 4.3 02/04/2024   CL 106 02/04/2024   CO2 23 02/04/2024   Lab Results  Component Value Date   ALT 14 02/04/2024   AST 17 02/04/2024   ALKPHOS 47 02/04/2024   BILITOT 0.6 02/04/2024   Lab Results  Component Value Date   HGBA1C 5.6 11/30/2023   HGBA1C 5.5 07/27/2023   HGBA1C 5.4 04/05/2023   HGBA1C 5.5 11/08/2022   HGBA1C 5.7 (H) 06/17/2022   Lab Results  Component Value Date   INSULIN  4.7 11/30/2023   INSULIN  4.3 07/27/2023   INSULIN  4.9 04/05/2023   INSULIN  4.7 11/08/2022   INSULIN  3.1 06/17/2022   Lab Results  Component Value Date   TSH 1.610 11/30/2023   Lab Results  Component Value Date   CHOL 103 11/30/2023   HDL 59 11/30/2023   LDLCALC 30 11/30/2023   TRIG 66 11/30/2023   CHOLHDL 1.7 11/30/2023   Lab Results  Component Value Date   VD25OH 64.6 11/30/2023   VD25OH 60.1 07/27/2023   VD25OH 49.4 04/05/2023   Lab Results  Component Value Date   WBC 7.8 02/04/2024   HGB 14.3 02/04/2024   HCT 41.3 02/04/2024   MCV 88.1 02/04/2024   PLT 323 02/04/2024   No results found for: IRON, TIBC, FERRITIN  Kim Clements was educated on the importance of frequent visits to treat obesity as outlined per CMS and USPSTF guidelines and agreed to schedule her next follow up  appointment today.  Attestation Statements:   Reviewed by clinician on day of visit: allergies, medications, problem list, medical history, surgical history, family history, social history, and previous encounter notes.  Due to seeing both her and her husband during same OV- extra time is needed to chart review, pre-chart, and manage care   Time spent on visit including pre-visit chart review and post-visit care and charting was 45 minutes.   I have reviewed the above documentation for accuracy and completeness, and I agree with the above. -  Merlen Gurry d. Argie Applegate, NP-C

## 2024-06-27 ENCOUNTER — Encounter (INDEPENDENT_AMBULATORY_CARE_PROVIDER_SITE_OTHER): Payer: Self-pay | Admitting: Adult Health

## 2024-06-27 ENCOUNTER — Ambulatory Visit (INDEPENDENT_AMBULATORY_CARE_PROVIDER_SITE_OTHER): Admitting: Adult Health

## 2024-06-27 VITALS — BP 112/73 | HR 76 | Temp 98.7°F | Ht 61.0 in | Wt 145.0 lb

## 2024-06-27 DIAGNOSIS — E559 Vitamin D deficiency, unspecified: Secondary | ICD-10-CM | POA: Diagnosis not present

## 2024-06-27 DIAGNOSIS — K5790 Diverticulosis of intestine, part unspecified, without perforation or abscess without bleeding: Secondary | ICD-10-CM

## 2024-06-27 DIAGNOSIS — E669 Obesity, unspecified: Secondary | ICD-10-CM | POA: Diagnosis not present

## 2024-06-27 DIAGNOSIS — R7303 Prediabetes: Secondary | ICD-10-CM

## 2024-06-27 DIAGNOSIS — Z6827 Body mass index (BMI) 27.0-27.9, adult: Secondary | ICD-10-CM

## 2024-06-27 DIAGNOSIS — Z6832 Body mass index (BMI) 32.0-32.9, adult: Secondary | ICD-10-CM

## 2024-06-27 NOTE — Progress Notes (Signed)
 WEIGHT SUMMARY AND BIOMETRICS  Vitals Temp: 98.7 F (37.1 C) BP: 112/73 Pulse Rate: 76 SpO2: 98 %   Anthropometric Measurements Height: 5' 1 (1.549 m) Weight: 145 lb (65.8 kg) BMI (Calculated): 27.41 Weight at Last Visit: 144lb Weight Lost Since Last Visit: 0lb Weight Gained Since Last Visit: 1lb Starting Weight: 170lb Total Weight Loss (lbs): 25 lb (11.3 kg)   Body Composition  Body Fat %: 37.2 % Fat Mass (lbs): 54.2 lbs Muscle Mass (lbs): 87 lbs Total Body Water (lbs): 62 lbs Visceral Fat Rating : 9   Other Clinical Data RMR: 1310 Fasting: No Labs: No Today's Visit #: 58 Starting Date: 07/07/21    Chief Complaint:   OBESITY Kim Clements is here to discuss her progress with her obesity treatment plan.  She is on the the Category 1 Plan and states she is following her eating plan approximately 80 % of the time.  She states she is exercising: NEAT Activities  Interim History:   07/07/21 07:00  Weight 170 lb (77.1 kg)  BMI (Calculated) 32.14    06/27/24 10:00  Weight 145 lb (65.8 kg)  BMI (Calculated) 27.41  Total Weight Loss (lbs) 25 lb (11.3 kg)    06/27/24 10:00  Muscle Mass (lbs) 87 lbs  Fat Mass (lbs) 54.2 lbs  Total Body Water (lbs) 62 lbs  Visceral Fat Rating  9   Reviewed that her BMI is stable at 27 Visceral Rating at goal: 9 Muscle Mass> Adipose Mass  Consider starting Mx Phase this Fall/Winter  Reviewed Bioimpedance Results with pt: Muscle Mass +0.2 lb Adipose Mass: +0.8 lb Of note- Husband at [facility] during OV   Subjective:   1. Vitamin D  deficiency  Latest Reference Range & Units 04/05/23 10:20 07/27/23 11:24 11/30/23 12:15  Vitamin D , 25-Hydroxy 30.0 - 100.0 ng/mL 49.4 60.1 64.6   She is on OTC Vit D 3 - 4,000 international units daily  2. Prediabetes Lab Results  Component Value Date   HGBA1C 5.6 11/30/2023   HGBA1C 5.5 07/27/2023   HGBA1C 5.4 04/05/2023    She has been trying to limit simple CHO or sugar  intake. She is not on any antidiabetic medications at present  3. Diverticulosis Gastroenterology OV Notes:  02/17/2024  HISTORY OF PRESENT ILLNESS: This is a 58 year old female who is a patient of Dr. Trenna.  She has IBS-D and lactose intolerance.  She takes Viberzi  75 mg daily for her IBS symptoms.  Is here on this occasion for follow-up of her recent ED visit.  He was evaluated on 5/31 with complaints of severe left lower quadrant abdominal pain.  CT scan was unremarkable, showed diverticulosis without diverticulitis.  Labs unremarkable.  Was told to follow-up with her gastroenterologist and consider repeat colonoscopy.  She says that the pain has significantly improved.  She says that really just yesterday and today were the first days that she feels much better.  She has had treatment empirically for diverticulitis remotely, several years ago, but unaware of any actual CT/radiographic evidence of diverticulitis.   Colonoscopy February 12, 2016 showed sigmoid diverticulosis internal hemorrhoids otherwise normal exam   Colonoscopy Jan 09, 2010 by Dr. Rosalie at [facility] sigmoid diverticulosis otherwise normal exam   Colonoscopy October 20 2004 by Dr. [facility]ie [facility].  Terminal ileum biopsies normal mucosa.  2 small polypoid appearing polyps were removed with biopsy forceps showed benign colonic mucosa.  Sigmoid diverticulosis  Assessment/Plan:   1. Vitamin D  deficiency (Primary) Check Labs at  next OV  2. Prediabetes Continue Cat 1 MP and increase regular walking  3. Diverticulosis 02/17/2024 Gastroenterology OV Notes: ASSESSMENT AND PLAN: *Left lower quadrant abdominal pain: Had significant left lower quadrant abdominal pain a couple of weeks ago.  CT scan did not show diverticulitis.  No explanation for abdominal pain seen on CT scan.  Possibly symptomatic diverticulosis/intestinal spasm related to her IBS.  Last colonoscopy was in 2017 so not unreasonable to go ahead and repeat that.  Will  schedule with Dr. Nandigam.  I am going to also prescribe Levsin  0.125 mg sublingual for her to use as needed for abdominal pain.  The risks, benefits, and alternatives to colonoscopy were discussed with the patient and she consents to proceed.  *IBS-D: Takes Viberzi  75 mg daily. *Lactose intolerance  4. Obesity, current BMI 27.5  Kim Clements is currently in the action stage of change. As such, her goal is to maintain weight for now. She has agreed to the Category 1 Plan.   Exercise goals: All adults should avoid inactivity. Some physical activity is better than none, and adults who participate in any amount of physical activity gain some health benefits. Adults should also include muscle-strengthening activities that involve all major muscle groups on 2 or more days a week. Walk at least 3 x week  Behavioral modification strategies: increasing lean protein intake, decreasing simple carbohydrates, increasing vegetables, increasing water intake, decreasing eating out, no skipping meals, meal planning and cooking strategies, keeping healthy foods in the home, ways to avoid boredom eating, and planning for success.  Kim Clements has agreed to follow-up with our clinic in 4 weeks. She was informed of the importance of frequent follow-up visits to maximize her success with intensive lifestyle modifications for her multiple health conditions.   Check Fasting Labs at next OV  Objective:   Blood pressure 112/73, pulse 76, temperature 98.7 F (37.1 C), height 5' 1 (1.549 m), weight 145 lb (65.8 kg), SpO2 98%. Body mass index is 27.4 kg/m.  General: Cooperative, alert, well developed, in no acute distress. HEENT: Conjunctivae and lids unremarkable. Cardiovascular: Regular rhythm.  Lungs: Normal work of breathing. Neurologic: No focal deficits.   Lab Results  Component Value Date   CREATININE 0.83 02/04/2024   BUN 17 02/04/2024   NA 141 02/04/2024   K 4.3 02/04/2024   CL 106 02/04/2024   CO2 23  02/04/2024   Lab Results  Component Value Date   ALT 14 02/04/2024   AST 17 02/04/2024   ALKPHOS 47 02/04/2024   BILITOT 0.6 02/04/2024   Lab Results  Component Value Date   HGBA1C 5.6 11/30/2023   HGBA1C 5.5 07/27/2023   HGBA1C 5.4 04/05/2023   HGBA1C 5.5 11/08/2022   HGBA1C 5.7 (H) 06/17/2022   Lab Results  Component Value Date   INSULIN  4.7 11/30/2023   INSULIN  4.3 07/27/2023   INSULIN  4.9 04/05/2023   INSULIN  4.7 11/08/2022   INSULIN  3.1 06/17/2022   Lab Results  Component Value Date   TSH 1.610 11/30/2023   Lab Results  Component Value Date   CHOL 103 11/30/2023   HDL 59 11/30/2023   LDLCALC 30 11/30/2023   TRIG 66 11/30/2023   CHOLHDL 1.7 11/30/2023   Lab Results  Component Value Date   VD25OH 64.6 11/30/2023   VD25OH 60.1 07/27/2023   VD25OH 49.4 04/05/2023   Lab Results  Component Value Date   WBC 7.8 02/04/2024   HGB 14.3 02/04/2024   HCT 41.3 02/04/2024   MCV 88.1 02/04/2024  PLT 323 02/04/2024   No results found for: IRON, TIBC, FERRITIN  Attestation Statements:   Reviewed by clinician on day of visit: allergies, medications, problem list, medical history, surgical history, family history, social history, and previous encounter notes.  Due to seeing both her and her husband during same OV- extra time is needed to chart review, pre-chart, and manage care    Time spent on visit including pre-visit chart review and post-visit care and charting was 42 minutes.    I have reviewed the above documentation for accuracy and completeness, and I agree with the above. -  Tomasita Beevers d. Renay Crammer, NP-C

## 2024-07-05 ENCOUNTER — Other Ambulatory Visit: Payer: Self-pay | Admitting: Physician Assistant

## 2024-07-06 ENCOUNTER — Other Ambulatory Visit: Payer: Self-pay | Admitting: Obstetrics and Gynecology

## 2024-07-06 DIAGNOSIS — Z9189 Other specified personal risk factors, not elsewhere classified: Secondary | ICD-10-CM

## 2024-07-13 ENCOUNTER — Encounter (HOSPITAL_BASED_OUTPATIENT_CLINIC_OR_DEPARTMENT_OTHER): Payer: Self-pay

## 2024-07-13 ENCOUNTER — Telehealth: Payer: Self-pay | Admitting: Internal Medicine

## 2024-07-13 NOTE — Telephone Encounter (Signed)
 Spoke with patient who stated she saw dermatologist yesterday for something different and was felt related to Praluent .  Stated she was due to take injection last night, did not take Rash started about a week after injection  Had similar reaction to Repatha  however this is more intense Started off itching and used cortisone which did help.  Area does seem to be getting darker in color  Did advise to remain off   Will forward to [place] for review

## 2024-07-13 NOTE — Telephone Encounter (Signed)
 See mychart message from 11/7

## 2024-07-13 NOTE — Telephone Encounter (Signed)
 Pt c/o medication issue:  1. Name of Medication:  Alirocumab  (PRALUENT ) 75 MG/ML SOAJ  2. How are you currently taking this medication (dosage and times per day)?   3. Are you having a reaction (difficulty breathing--STAT)?   4. What is your medication issue?   Patient says 10 days after first Praluent  injection she developed a rash on her thigh where she injected it. She says she saw her dermatologist yesterday for something unrelated and they told her it looked like a rash. Please advise.

## 2024-07-17 NOTE — Telephone Encounter (Signed)
 Will you please help with scheduling? TY!  Kim Clements S Kim Zagal, NP

## 2024-07-17 NOTE — Telephone Encounter (Signed)
 Regular lipid panel in 2 months or NMR?   Kim Clements S Guiselle Mian, NP

## 2024-07-25 ENCOUNTER — Ambulatory Visit (INDEPENDENT_AMBULATORY_CARE_PROVIDER_SITE_OTHER): Payer: Self-pay | Admitting: Adult Health

## 2024-07-25 ENCOUNTER — Encounter (INDEPENDENT_AMBULATORY_CARE_PROVIDER_SITE_OTHER): Payer: Self-pay | Admitting: Adult Health

## 2024-07-25 VITALS — BP 111/76 | HR 74 | Temp 97.4°F | Ht 61.0 in | Wt 145.0 lb

## 2024-07-25 DIAGNOSIS — E739 Lactose intolerance, unspecified: Secondary | ICD-10-CM

## 2024-07-25 DIAGNOSIS — E669 Obesity, unspecified: Secondary | ICD-10-CM

## 2024-07-25 DIAGNOSIS — Z0000 Encounter for general adult medical examination without abnormal findings: Secondary | ICD-10-CM

## 2024-07-25 DIAGNOSIS — E782 Mixed hyperlipidemia: Secondary | ICD-10-CM | POA: Diagnosis not present

## 2024-07-25 DIAGNOSIS — E559 Vitamin D deficiency, unspecified: Secondary | ICD-10-CM

## 2024-07-25 DIAGNOSIS — R7303 Prediabetes: Secondary | ICD-10-CM

## 2024-07-25 DIAGNOSIS — Z6832 Body mass index (BMI) 32.0-32.9, adult: Secondary | ICD-10-CM

## 2024-07-25 DIAGNOSIS — Z6827 Body mass index (BMI) 27.0-27.9, adult: Secondary | ICD-10-CM

## 2024-07-25 NOTE — Progress Notes (Signed)
 WEIGHT SUMMARY AND BIOMETRICS  Vitals Temp: (!) 97.4 F (36.3 C) BP: 111/76 Pulse Rate: 74 SpO2: 100 %   Anthropometric Measurements Height: 5' 1 (1.549 m) Weight: 145 lb (65.8 kg) BMI (Calculated): 27.41 Weight at Last Visit: 145lb Weight Lost Since Last Visit: 0lb Weight Gained Since Last Visit: 0lb Starting Weight: 170lb Total Weight Loss (lbs): 25 lb (11.3 kg)   Body Composition  Body Fat %: 37.4 % Fat Mass (lbs): 54.6 lbs Muscle Mass (lbs): 86.6 lbs Total Body Water (lbs): 63.4 lbs Visceral Fat Rating : 9   Other Clinical Data RMR: 1310 Fasting: yes Labs: No Today's Visit #: 44 Starting Date: 07/07/21    Chief Complaint:   OBESITY Kim Clements is here to discuss her progress with her obesity treatment plan.  She is on the the Category 1 Plan and states she is following her eating plan approximately 85 % of the time.  She states she is exercising: NEAT Activities  Interim History:  Kim Clements administered her first PRALUENT  injection on 10/23 07/08/2024 she developed injection site reaction. Reviewed MyChart Messages from pt to Cards team. She has remained off Praluent  and will f/u with Cards Jan 2026  07/13/2024- Cards recommends checking lipids in 2 months  Of Note- Pt's husband is at [facility] during OV  Subjective:   1. Lactose intolerance 10/11/2023 Gastroenterology OV Notes: History of Present Illness   Kim Clements is a 58 year old very pleasant female with irritable bowel syndrome who presents for management of bowel habits and lactose intolerance.   She experiences one to two bowel movements per day but occasionally has days without any bowel movements. She manages her irritable bowel syndrome with Viberzi , taking it once daily, and increases the dose to twice daily during periods of anxiety or stress, which exacerbate her symptoms. She sometimes skips doses if she anticipates constipation. No current diarrhea, stomach pains,  blood in stool, or vomiting. Her weight has increased slightly since last year. She has urgency with bowel movements, particularly when not taking Viberzi , and is concerned about controlling this urgency, which can occur even with formed stools.   She has lactose intolerance and manages it by avoiding lactose-containing foods or using lactase enzyme supplements. She is cautious about consuming dairy and prefers lactose-free or dairy-free alternatives. Symptoms occur if she consumes lactose without taking the enzyme supplements. She is concerned about the social implications of her dietary restrictions, especially when dining with others.   She mentions being prediabetic but notes that her recent blood work indicates she is no longer in the prediabetic range. She attributes some weight gain to dietary choices, including consuming [employer] drinks, which she acknowledges contain sugar and calories.       Colonoscopy February 12, 2016 showed sigmoid diverticulosis internal hemorrhoids otherwise normal exam   Colonoscopy Jan 09, 2010 by Dr. Rosalie at [facility] sigmoid diverticulosis otherwise normal exam   Colonoscopy October 20 2004 by Dr. [facility]ie [facility].  Terminal ileum biopsies normal mucosa.  2 small polypoid appearing polyps were removed with biopsy forceps showed benign colonic mucosa.  Sigmoid diverticulosis  2. Vitamin D  deficiency  Latest Reference Range & Units 04/05/23 10:20 07/27/23 11:24 11/30/23 12:15  Vitamin D , 25-Hydroxy 30.0 - 100.0 ng/mL 49.4 60.1 64.6   Vit D Level stable  3. Healthcare maintenance Kim Clements is fasting and well hydrated for labs to be completed. She endorses stable energy levels.  4. Prediabetes Lab Results  Component Value  Date   HGBA1C 5.6 11/30/2023   HGBA1C 5.5 07/27/2023   HGBA1C 5.4 04/05/2023     Latest Reference Range & Units 04/05/23 10:20 07/27/23 11:24 11/30/23 12:15  INSULIN  2.6 - 24.9 uIU/mL 4.9 4.3 4.7   She is not currently on any  antidiabetic medications.  5. Mixed hyperlipidemia Ms. Peil administered her first PRALUENT   injection on 10/23 07/08/2024 she developed injection site reaction. Reviewed MyChart Messages from pt to Cards team. She has remained off Praluent  and will f/u with Cards Jan 2026  Assessment/Plan:   1. Lactose intolerance (Primary) Avoid known trigger foods  2. Vitamin D  deficiency Check Labs - VITAMIN D  25 Hydroxy (Vit-D Deficiency, Fractures)  3. Healthcare maintenance Check Labs - Vitamin B12  4. Prediabetes Check Labs - Hemoglobin A1c - Insulin , random  5. Mixed hyperlipidemia Check Labs - Comprehensive metabolic panel with GFR  6. Obesity, current BMI 27.5  Jeff is currently in the action stage of change. As such, her goal is to continue with weight loss efforts. She has agreed to the Category 1 Plan.   Acceptable Snacks: Baked Salt n Vinger chips, one small bag= 120 cal  Exercise goals: For substantial health benefits, adults should do at least 150 minutes (2 hours and 30 minutes) a week of moderate-intensity, or 75 minutes (1 hour and 15 minutes) a week of vigorous-intensity aerobic physical activity, or an equivalent combination of moderate- and vigorous-intensity aerobic activity. Aerobic activity should be performed in episodes of at least 10 minutes, and preferably, it should be spread throughout the week.  Behavioral modification strategies: increasing lean protein intake, decreasing simple carbohydrates, increasing vegetables, increasing water intake, decreasing eating out, meal planning and cooking strategies, keeping healthy foods in the home, ways to avoid boredom eating, ways to avoid night time snacking, planning for success, and decreasing junk food.  Kim Clements has agreed to follow-up with our clinic in 4 weeks. She was informed of the importance of frequent follow-up visits to maximize her success with intensive lifestyle modifications for her multiple  health conditions.   [name] was informed we would discuss her lab results at her next visit unless there is a critical issue that needs to be addressed sooner. Kim Clements agreed to keep her next visit at the agreed upon time to discuss these results.  Objective:   Blood pressure 111/76, pulse 74, temperature (!) 97.4 F (36.3 C), height 5' 1 (1.549 m), weight 145 lb (65.8 kg), SpO2 100%. Body mass index is 27.4 kg/m.  General: Cooperative, alert, well developed, in no acute distress. HEENT: Conjunctivae and lids unremarkable. Cardiovascular: Regular rhythm.  Lungs: Normal work of breathing. Neurologic: No focal deficits.   Lab Results  Component Value Date   CREATININE 0.83 02/04/2024   BUN 17 02/04/2024   NA 141 02/04/2024   K 4.3 02/04/2024   CL 106 02/04/2024   CO2 23 02/04/2024   Lab Results  Component Value Date   ALT 14 02/04/2024   AST 17 02/04/2024   ALKPHOS 47 02/04/2024   BILITOT 0.6 02/04/2024   Lab Results  Component Value Date   HGBA1C 5.6 11/30/2023   HGBA1C 5.5 07/27/2023   HGBA1C 5.4 04/05/2023   HGBA1C 5.5 11/08/2022   HGBA1C 5.7 (H) 06/17/2022   Lab Results  Component Value Date   INSULIN  4.7 11/30/2023   INSULIN  4.3 07/27/2023   INSULIN  4.9 04/05/2023   INSULIN  4.7 11/08/2022   INSULIN  3.1 06/17/2022   Lab Results  Component Value Date   TSH  1.610 11/30/2023   Lab Results  Component Value Date   CHOL 103 11/30/2023   HDL 59 11/30/2023   LDLCALC 30 11/30/2023   TRIG 66 11/30/2023   CHOLHDL 1.7 11/30/2023   Lab Results  Component Value Date   VD25OH 64.6 11/30/2023   VD25OH 60.1 07/27/2023   VD25OH 49.4 04/05/2023   Lab Results  Component Value Date   WBC 7.8 02/04/2024   HGB 14.3 02/04/2024   HCT 41.3 02/04/2024   MCV 88.1 02/04/2024   PLT 323 02/04/2024   No results found for: IRON, TIBC, FERRITIN  Attestation Statements:   Reviewed by clinician on day of visit: allergies, medications, problem list, medical  history, surgical history, family history, social history, and previous encounter notes.  Due to seeing both her and her husband during same OV- extra time is needed to chart review, pre-chart, and manage care   I have reviewed the above documentation for accuracy and completeness, and I agree with the above. -  Edilberto Roosevelt d. Basilia Stuckert, NP-C

## 2024-07-26 LAB — COMPREHENSIVE METABOLIC PANEL WITH GFR
ALT: 18 IU/L (ref 0–32)
AST: 19 IU/L (ref 0–40)
Albumin: 4.7 g/dL (ref 3.8–4.9)
Alkaline Phosphatase: 56 IU/L (ref 49–135)
BUN/Creatinine Ratio: 25 — ABNORMAL HIGH (ref 9–23)
BUN: 21 mg/dL (ref 6–24)
Bilirubin Total: 0.6 mg/dL (ref 0.0–1.2)
CO2: 25 mmol/L (ref 20–29)
Calcium: 9.9 mg/dL (ref 8.7–10.2)
Chloride: 102 mmol/L (ref 96–106)
Creatinine, Ser: 0.84 mg/dL (ref 0.57–1.00)
Globulin, Total: 1.9 g/dL (ref 1.5–4.5)
Glucose: 83 mg/dL (ref 70–99)
Potassium: 4.7 mmol/L (ref 3.5–5.2)
Sodium: 141 mmol/L (ref 134–144)
Total Protein: 6.6 g/dL (ref 6.0–8.5)
eGFR: 80 mL/min/1.73 (ref >59)

## 2024-07-26 LAB — HEMOGLOBIN A1C
Est. average glucose Bld gHb Est-mCnc: 105 mg/dL
Hgb A1c MFr Bld: 5.3 % (ref 4.8–5.6)

## 2024-07-26 LAB — VITAMIN D 25 HYDROXY (VIT D DEFICIENCY, FRACTURES): Vit D, 25-Hydroxy: 42.6 ng/mL (ref 30.0–100.0)

## 2024-07-26 LAB — INSULIN, RANDOM: INSULIN: 5.4 u[IU]/mL (ref 2.6–24.9)

## 2024-07-26 LAB — VITAMIN B12: Vitamin B-12: 271 pg/mL (ref 232–1245)

## 2024-08-21 ENCOUNTER — Ambulatory Visit (INDEPENDENT_AMBULATORY_CARE_PROVIDER_SITE_OTHER): Payer: Self-pay | Admitting: Adult Health

## 2024-08-21 ENCOUNTER — Encounter (INDEPENDENT_AMBULATORY_CARE_PROVIDER_SITE_OTHER): Payer: Self-pay | Admitting: Adult Health

## 2024-08-21 VITALS — BP 109/69 | HR 79 | Temp 98.7°F | Ht 61.0 in | Wt 144.0 lb

## 2024-08-21 DIAGNOSIS — E559 Vitamin D deficiency, unspecified: Secondary | ICD-10-CM | POA: Diagnosis not present

## 2024-08-21 DIAGNOSIS — E669 Obesity, unspecified: Secondary | ICD-10-CM | POA: Diagnosis not present

## 2024-08-21 DIAGNOSIS — E782 Mixed hyperlipidemia: Secondary | ICD-10-CM

## 2024-08-21 DIAGNOSIS — R7303 Prediabetes: Secondary | ICD-10-CM

## 2024-08-21 DIAGNOSIS — Z6827 Body mass index (BMI) 27.0-27.9, adult: Secondary | ICD-10-CM

## 2024-08-21 DIAGNOSIS — Z6832 Body mass index (BMI) 32.0-32.9, adult: Secondary | ICD-10-CM

## 2024-08-21 DIAGNOSIS — Z0000 Encounter for general adult medical examination without abnormal findings: Secondary | ICD-10-CM

## 2024-08-21 NOTE — Progress Notes (Signed)
 WEIGHT SUMMARY AND BIOMETRICS  Vitals Temp: 98.7 F (37.1 C) BP: 109/69 Pulse Rate: 79 SpO2: 100 %   Anthropometric Measurements Height: 5' 1 (1.549 m) Weight: 144 lb (65.3 kg) BMI (Calculated): 27.22 Weight at Last Visit: 145lb Weight Lost Since Last Visit: 1lb Weight Gained Since Last Visit: 0lb Starting Weight: 170lb Total Weight Loss (lbs): 26 lb (11.8 kg)   Body Composition  Body Fat %: 37.3 % Fat Mass (lbs): 54 lbs Muscle Mass (lbs): 86.2 lbs Total Body Water (lbs): 63.6 lbs Visceral Fat Rating : 9   Other Clinical Data RMR: 1310 Fasting: no Labs: No Today's Visit #: 45 Starting Date: 07/07/21    Chief Complaint:   OBESITY Kim Clements is here to discuss her progress with her obesity treatment plan.  She is on the the Category 1 Plan and states she is following her eating plan approximately 75-80 % of the time.  She states she is exercising Walking 30 minutes 2-3 times per week.  Interim History:  The family lost their beloved dog, Con 2 days before Thanksgiving. She endorses low appetite since her passing.  Reviewed Bioimpedance Results with pt: Muscle Mass: -0.4 lb Adipose Mass: -0.6 lb  Of Note- Husband at [facility] during OV  Subjective:   1. Vitamin D  deficiency Discussed Labs  Latest Reference Range & Units 07/25/24 13:14  Vitamin D , 25-Hydroxy 30.0 - 100.0 ng/mL 42.6   Vit D just below goal of 50-70 She is on OTC Vit D3 2000 international units daily  2. Healthcare maintenance Discussed Labs  Latest Reference Range & Units 07/25/24 13:14  Vitamin B12 232 - 1,245 pg/mL 271   3. Mixed hyperlipidemia Discussed Labs 07/25/2024 CMP: Liver Enzymes are normal HLD being managed by [facility]  4. Prediabetes Discussed Labs  Latest Reference Range & Units 07/25/24 13:14  Glucose 70 - 99 mg/dL 83  Hemoglobin J8R 4.8 - 5.6 % 5.3  Est. average glucose Bld gHb Est-mCnc mg/dL 894  INSULIN  2.6 - 24.9 uIU/mL 5.4   CBG, A1c, and Insulin   levels all at goal She is not on antidiabetic medications  Assessment/Plan:   1. Vitamin D  deficiency (Primary) Continue current OTC Vit D 3 and start daily OTC MVI  2. Healthcare maintenance Start daily OTC MVI  3. Mixed hyperlipidemia Continue Cat 1 MP and increase regular walking F/u with established cardiologist  4. Prediabetes Continue Cat 1 MP and increase regular walking  5. Obesity, current BMI 27.4  Kasara is currently in the action stage of change. As such, her goal is to continue with weight loss efforts. She has agreed to the Category 1 Plan.   Exercise goals: All adults should avoid inactivity. Some physical activity is better than none, and adults who participate in any amount of physical activity gain some health benefits. Adults should also include muscle-strengthening activities that involve all major muscle groups on 2 or more days a week. Increase walking either outside or on home treadmill  Behavioral modification strategies: increasing lean protein intake, decreasing simple carbohydrates, increasing vegetables, increasing water intake, no skipping meals, meal planning and cooking strategies, keeping healthy foods in the home, ways to avoid boredom eating, and avoiding temptations.  [name] has agreed to follow-up with our clinic in 4 weeks. She was informed of the importance of frequent follow-up visits to maximize her success with intensive lifestyle modifications for her multiple health conditions.   Objective:   Blood pressure 109/69, pulse 79, temperature 98.7 F (37.1 C), height 5'  1 (1.549 m), weight 144 lb (65.3 kg), SpO2 100%. Body mass index is 27.21 kg/m.  General: Cooperative, alert, well developed, in no acute distress. HEENT: Conjunctivae and lids unremarkable. Cardiovascular: Regular rhythm.  Lungs: Normal work of breathing. Neurologic: No focal deficits.   Lab Results  Component Value Date   CREATININE 0.84 07/25/2024   BUN 21  07/25/2024   NA 141 07/25/2024   K 4.7 07/25/2024   CL 102 07/25/2024   CO2 25 07/25/2024   Lab Results  Component Value Date   ALT 18 07/25/2024   AST 19 07/25/2024   ALKPHOS 56 07/25/2024   BILITOT 0.6 07/25/2024   Lab Results  Component Value Date   HGBA1C 5.3 07/25/2024   HGBA1C 5.6 11/30/2023   HGBA1C 5.5 07/27/2023   HGBA1C 5.4 04/05/2023   HGBA1C 5.5 11/08/2022   Lab Results  Component Value Date   INSULIN  5.4 07/25/2024   INSULIN  4.7 11/30/2023   INSULIN  4.3 07/27/2023   INSULIN  4.9 04/05/2023   INSULIN  4.7 11/08/2022   Lab Results  Component Value Date   TSH 1.610 11/30/2023   Lab Results  Component Value Date   CHOL 103 11/30/2023   HDL 59 11/30/2023   LDLCALC 30 11/30/2023   TRIG 66 11/30/2023   CHOLHDL 1.7 11/30/2023   Lab Results  Component Value Date   VD25OH 42.6 07/25/2024   VD25OH 64.6 11/30/2023   VD25OH 60.1 07/27/2023   Lab Results  Component Value Date   WBC 7.8 02/04/2024   HGB 14.3 02/04/2024   HCT 41.3 02/04/2024   MCV 88.1 02/04/2024   PLT 323 02/04/2024   No results found for: IRON, TIBC, FERRITIN  Attestation Statements:   Reviewed by clinician on day of visit: allergies, medications, problem list, medical history, surgical history, family history, social history, and previous encounter notes.  Due to seeing both her and her husband during same OV- extra time is needed to chart review, pre-chart, and manage care   I have reviewed the above documentation for accuracy and completeness, and I agree with the above. -  Whitney Hillegass d. Ouita Nish, NP-C

## 2024-08-24 ENCOUNTER — Telehealth: Payer: Self-pay | Admitting: Gastroenterology

## 2024-08-24 MED ORDER — VIBERZI 75 MG PO TABS: 1.0000 | ORAL_TABLET | Freq: Two times a day (BID) | ORAL | 1 refills | Status: AC

## 2024-08-24 MED ORDER — VIBERZI 75 MG PO TABS: 1.0000 | ORAL_TABLET | Freq: Two times a day (BID) | ORAL | 1 refills | Status: DC

## 2024-08-24 NOTE — Telephone Encounter (Signed)
 Inbound call from patient stating that she is needing a refill on her Viberzi  75 MG. Patient is requesting a call back to inform her it has been sent to [facility]. Please advise.

## 2024-08-24 NOTE — Telephone Encounter (Signed)
 Viberzi  refilled, rx was [facility] in [facility]. I called and informed Donny and wished her a Happy Birthday. She is up to date on her office visits.

## 2024-09-05 ENCOUNTER — Encounter (HOSPITAL_BASED_OUTPATIENT_CLINIC_OR_DEPARTMENT_OTHER): Payer: Self-pay

## 2024-09-05 ENCOUNTER — Other Ambulatory Visit (HOSPITAL_BASED_OUTPATIENT_CLINIC_OR_DEPARTMENT_OTHER): Payer: Self-pay | Admitting: *Deleted

## 2024-09-05 DIAGNOSIS — E785 Hyperlipidemia, unspecified: Secondary | ICD-10-CM

## 2024-09-05 DIAGNOSIS — I2510 Atherosclerotic heart disease of native coronary artery without angina pectoris: Secondary | ICD-10-CM

## 2024-09-05 DIAGNOSIS — I700 Atherosclerosis of aorta: Secondary | ICD-10-CM

## 2024-09-05 DIAGNOSIS — E7841 Elevated Lipoprotein(a): Secondary | ICD-10-CM

## 2024-09-19 ENCOUNTER — Encounter (INDEPENDENT_AMBULATORY_CARE_PROVIDER_SITE_OTHER): Payer: Self-pay | Admitting: Adult Health

## 2024-09-19 ENCOUNTER — Ambulatory Visit (INDEPENDENT_AMBULATORY_CARE_PROVIDER_SITE_OTHER): Admitting: Adult Health

## 2024-09-19 ENCOUNTER — Ambulatory Visit (HOSPITAL_BASED_OUTPATIENT_CLINIC_OR_DEPARTMENT_OTHER): Payer: Self-pay | Admitting: Nurse Practitioner

## 2024-09-19 VITALS — BP 91/62 | HR 81 | Temp 98.7°F | Ht 61.0 in | Wt 144.0 lb

## 2024-09-19 DIAGNOSIS — E669 Obesity, unspecified: Secondary | ICD-10-CM | POA: Diagnosis not present

## 2024-09-19 DIAGNOSIS — R7303 Prediabetes: Secondary | ICD-10-CM | POA: Diagnosis not present

## 2024-09-19 DIAGNOSIS — E559 Vitamin D deficiency, unspecified: Secondary | ICD-10-CM | POA: Diagnosis not present

## 2024-09-19 DIAGNOSIS — E66811 Obesity, class 1: Secondary | ICD-10-CM

## 2024-09-19 DIAGNOSIS — E782 Mixed hyperlipidemia: Secondary | ICD-10-CM | POA: Diagnosis not present

## 2024-09-19 DIAGNOSIS — Z6827 Body mass index (BMI) 27.0-27.9, adult: Secondary | ICD-10-CM | POA: Diagnosis not present

## 2024-09-19 DIAGNOSIS — Z0000 Encounter for general adult medical examination without abnormal findings: Secondary | ICD-10-CM

## 2024-09-19 LAB — NMR, LIPOPROFILE
Cholesterol, Total: 126 mg/dL (ref 100–199)
HDL Particle Number: 39.5 umol/L
HDL-C: 49 mg/dL
LDL Particle Number: 744 nmol/L
LDL Size: 20 nm — ABNORMAL LOW
LDL-C (NIH Calc): 64 mg/dL (ref 0–99)
LP-IR Score: 62 — ABNORMAL HIGH
Small LDL Particle Number: 445 nmol/L
Triglycerides: 60 mg/dL (ref 0–149)

## 2024-09-19 NOTE — Progress Notes (Signed)
 "    WEIGHT SUMMARY AND BIOMETRICS  No data recorded Anthropometric Measurements Height: 5' 1 (1.549 m) Weight at Last Visit: 144lb Starting Weight: 170lb   No data recorded Other Clinical Data RMR: 1310 Fasting: No Labs: No Today's Visit #: 59 Starting Date: 07/07/21    Chief Complaint:   OBESITY Kim Clements is here to discuss her progress with her obesity treatment plan.  She is on the the Category 1 Plan and states she is following her eating plan approximately 80 % of the time.  She states she is exercising Walking 20 minutes 3 times per week.  Interim History:  She had fasting labs completed yesterday for her Cardiologist- results are not posted in EPIC  Reviewed Bioimpedance Results with pt: Muscle Mass: +0.6 lb Adipose Mass: -1.4 lbs   Of Note- Husband at [facility] during OV  Subjective:   1. Mixed hyperlipidemia She will complete labs and f/u with established Cardiologist  ADDENDUM REPORT: 03/17/2022 15:48   CLINICAL DATA:  Cardiovascular Disease Risk stratification   EXAM: Coronary Calcium  Score   TECHNIQUE: A gated, non-contrast computed tomography scan of the heart was performed using 3mm slice thickness. Axial images were analyzed on a dedicated workstation. Calcium  scoring of the coronary arteries was performed using the Agatston method.   FINDINGS: Coronary arteries: Normal origins.   Coronary Calcium  Score:   Left main: 0   Left anterior descending artery: 17   Left circumflex artery: 0   Right coronary artery: 0   Total: 17   Percentile: 83rd   Pericardium: Normal.   Ascending Aorta: Normal caliber.   Non-cardiac: See separate report from [facility].   IMPRESSION: Coronary calcium  score of 17. This was 83rd percentile for age-, race-, and sex-matched controls.   2. Prediabetes Lab Results  Component Value Date   HGBA1C 5.3 07/25/2024   HGBA1C 5.6 11/30/2023   HGBA1C 5.5 07/27/2023    She denies breakthrough  polyphagia  3. Vitamin D  deficiency She endorses stable energy levels  Latest Reference Range & Units 07/27/23 11:24 11/30/23 12:15 07/25/24 13:14  Vitamin D , 25-Hydroxy 30.0 - 100.0 ng/mL 60.1 64.6 42.6   4. Healthcare maintenance She has started daily OTC MVI She endorses stable energy levels  Assessment/Plan:   1. Mixed hyperlipidemia Limit sat fat and continue to increase regular walking  2. Prediabetes (Primary) Continue healthy eating and continue to increase regular walking  3. Vitamin D  deficiency Monitor Labs  4. Healthcare maintenance Continue healthy eating and continue to increase regular walking  5. Obesity, current BMI 27.2  Kim Clements is currently in the action stage of change. As such, her goal is to continue with weight loss efforts. She has agreed to the Category 1 Plan.   Exercise goals: For substantial health benefits, adults should do at least 150 minutes (2 hours and 30 minutes) a week of moderate-intensity, or 75 minutes (1 hour and 15 minutes) a week of vigorous-intensity aerobic physical activity, or an equivalent combination of moderate- and vigorous-intensity aerobic activity. Aerobic activity should be performed in episodes of at least 10 minutes, and preferably, it should be spread throughout the week.  Behavioral modification strategies: increasing lean protein intake, decreasing simple carbohydrates, increasing vegetables, increasing water intake, no skipping meals, meal planning and cooking strategies, keeping healthy foods in the home, better snacking choices, and planning for success.  Kelita has agreed to follow-up with our clinic in 4 weeks. She was informed of the importance of frequent follow-up visits to maximize her success  with intensive lifestyle modifications for her multiple health conditions.   Objective:   Height 5' 1 (1.549 m). Body mass index is 27.21 kg/m.  General: Cooperative, alert, well developed, in no acute  distress. HEENT: Conjunctivae and lids unremarkable. Cardiovascular: Regular rhythm.  Lungs: Normal work of breathing. Neurologic: No focal deficits.   Lab Results  Component Value Date   CREATININE 0.84 07/25/2024   BUN 21 07/25/2024   NA 141 07/25/2024   K 4.7 07/25/2024   CL 102 07/25/2024   CO2 25 07/25/2024   Lab Results  Component Value Date   ALT 18 07/25/2024   AST 19 07/25/2024   ALKPHOS 56 07/25/2024   BILITOT 0.6 07/25/2024   Lab Results  Component Value Date   HGBA1C 5.3 07/25/2024   HGBA1C 5.6 11/30/2023   HGBA1C 5.5 07/27/2023   HGBA1C 5.4 04/05/2023   HGBA1C 5.5 11/08/2022   Lab Results  Component Value Date   INSULIN  5.4 07/25/2024   INSULIN  4.7 11/30/2023   INSULIN  4.3 07/27/2023   INSULIN  4.9 04/05/2023   INSULIN  4.7 11/08/2022   Lab Results  Component Value Date   TSH 1.610 11/30/2023   Lab Results  Component Value Date   CHOL 103 11/30/2023   HDL 59 11/30/2023   LDLCALC 30 11/30/2023   TRIG 66 11/30/2023   CHOLHDL 1.7 11/30/2023   Lab Results  Component Value Date   VD25OH 42.6 07/25/2024   VD25OH 64.6 11/30/2023   VD25OH 60.1 07/27/2023   Lab Results  Component Value Date   WBC 7.8 02/04/2024   HGB 14.3 02/04/2024   HCT 41.3 02/04/2024   MCV 88.1 02/04/2024   PLT 323 02/04/2024   No results found for: IRON, TIBC, FERRITIN  Attestation Statements:   Reviewed by clinician on day of visit: allergies, medications, problem list, medical history, surgical history, family history, social history, and previous encounter notes.  Due to seeing both her and her husband during same OV- extra time is needed to chart review, pre-chart, and manage care. Time 45 mins.   I have reviewed the above documentation for accuracy and completeness, and I agree with the above. -  Yuuki Skeens d. Aimi Essner, NP-C "

## 2024-09-19 NOTE — Telephone Encounter (Signed)
 Pt reviewed results in mychart.   Last read by Dorothyann Dave Henry Donny at 4:08PM on 09/19/2024.

## 2024-09-19 NOTE — Progress Notes (Signed)
 " Cardiology Office Note   Date:  09/26/2024  ID:  Kim Clements, DOB [birth year], MRN [id number] PCP: Allwardt, Mardy HERO, PA-C  [facility] Providers Cardiologist:  None     PMH Dyslipidemia Family history early CAD Mother had MI age 60 Father died from stroke age 53 High cholesterol in both parents IBS Coronary artery calcification CT Calcium  score 03/16/22 CAC score 17 (83rd percentile) LM 0, LAD 17, LCx 0, RCA 0 Elevated LP(a) = 85.8 nmol/L  Referred to [facility] clinic and seen by Dr. Mona 03/05/2022.  Family history of early CAD including mother he had MI at age 34, both parents history of high cholesterol, father died of stroke suddenly at age 107.  At time of referral, total cholesterol 211, HDL 55, triglycerides 77, and LDL 142.  She could not tolerate statin due to significant myalgias.  Aortic atherosclerosis noted on abdominal imaging.  Her LDL improved from 1 42-85 and LDL particle number at 952 on Nexletol . Repatha  was added for lipid lowering therapy.  Seen in clinic 01/25/2023 by Dr. Mona, LP(a) improved from 85.8 to 51.1 nmol/L.  LDL particle number < 300 with LDL-C of 29, HDL-C of 55, triglycerides 77, and total cholesterol 100.  Recommendation to continue Nexletol  and Repatha  and return in 6 months for follow-up.  Seen by me on 03/07/24 for follow-up of hyperlipidemia and accompanied by her daughter, Schuyler. Had some concerning side effects from Repatha , including redness and itching at the injection site and gastrointestinal symptoms. These symptoms occurred recently, although she has been on Repatha  since 06/2022. Her lipoprotein(a) levels have decreased from the 80s to 51, but she could not tolerate the GI side effects she felt were exacerbated by her Repatha . History of lactose intolerance, diverticulitis and IBS, with a recent episode suggestive of diverticulosis, though a CT scan was negative. Bland liquid diet improved her symptoms.  Family history includes her father who died of a hemorrhagic stroke at 6, and her mother had a heart attack at 77 but is currently 64 and well. Admits she could modify lifestyle to improve her eating habits as she has been eating more chips, and fried food recently. She and her husband have been patients at [facility] and generally eating a healthy diet for the last few years. Has not been walking recently due to gastrointestinal issues but has access to a recumbent bike and treadmill at home. No chest pain, shortness of breath, edema, fatigue, palpitations, weakness, presyncope, syncope, orthopnea, and PND.   She was switched to Praluent  but unfortunately had a skin reaction to the medication after 1 injection.  Was seen by dermatology and rash felt to be caused by Praluent , so she discontinued.  History of Present Illness Discussed the use of AI scribe software for clinical note transcription with the patient, who gave verbal consent to proceed.  History of Present Illness Kim Clements is a very pleasant 59 year old female who presents for follow-up of hyperlipidemia.  We discussed her intolerance of Praluent  following 1 injection that occurred some time after October 1.  She unfortunately had symptoms that lingered for several weeks.  She is feeling well and she and her husband continue to work with healthy weight and wellness.  She generally walks or uses recumbent bike for exercise. No chest pain, dyspnea, palpitations, orthopnea, PND, edema, presyncope or syncope. She generally eats a healthy diet but does admit a fondness for French fries generally enjoying those once  a week. Home BP usually 90s to 110s. She helps care for her mom who is in assisted living at [facility].   ROS: See HPI  Studies Reviewed       Lipoprotein (a)  Date/Time Value Ref Range Status  01/18/2023 03:35 PM 51.1 <75.0 nmol/L Final    Comment:    Note:  Values greater than or  equal to 75.0 nmol/L may        indicate an independent risk factor for CHD,        but must be evaluated with caution when applied        to non-Caucasian populations due to the        influence of genetic factors on Lp(a) across        ethnicities.     Risk Assessment/Calculations           Physical Exam VS:  BP 130/64 (BP Location: Right Arm, Patient Position: Sitting, Cuff Size: Normal)   Pulse 84   Resp (!) 98   Ht 5' 1.5 (1.562 m)   Wt 147 lb (66.7 kg)   BMI 27.33 kg/m    Wt Readings from Last 3 Encounters:  09/26/24 147 lb (66.7 kg)  09/19/24 144 lb (65.3 kg)  08/21/24 144 lb (65.3 kg)    GEN: Well nourished, well developed in no acute distress NECK: No JVD; No carotid bruits CARDIAC: RRR, no murmurs, rubs, gallops RESPIRATORY:  Clear to auscultation without rales, wheezing or rhonchi  ABDOMEN: Soft, non-tender, non-distended EXTREMITIES:  No edema; No deformity    Assessment & Plan Elevated Lipoprotein(a) Hyperlipidemia LDL goal    Medication intolerances Lipid panel completed 09/18/24 with LDL particle number 744, LDL-C 64, HDL-C 49, triglycerides 60, total cholesterol 126, and small LDL-P 445.  LDL improved from 73 on 02/24/2024 LDL 73. Lp(a) levels have been reduced from over 80 to 51 with Repatha .  Advised that if Praluent  injection was in late October or November, there could be some residual inhibition of PCSK9 at time labs were collected.  Unfortunately she cannot tolerate PCSK9 inhibitor having tried both Praluent  and Repatha .  She is hesitant to try another injectable medication.  She is tolerating Nexletol  without concerning side effects.  LDL is at goal at this time.  We will recheck lipids in 6 months and if LDL is above goal, she will consider low-dose statin.  She does not want to retry rosuvastatin  but is willing to consider other lower potency statins. -Continue Nexletol  180 mg daily - Repeat lipids in 6 months - Consider low dose statin therapy if LDL  above goal on future labs  Palpitations Premature contraction noted on exam. She rarely feels these. Occasional episodes of feeling her HR increased but no sustained tachycardia or significant palpitations.  No indication for further testing or continuous monitoring.  We reviewed how to use smart watch for monitoring HR. - Continue to monitor clinically  Coronary artery calcification Nonspecific ST abnormality   CAC score of 17 on 03/16/2022 placing her in the 83rd percentile. EKG shows a nonspecific ST abnormality without symptoms such as chest pain or dyspnea. Similar findings were present in the 2022 EKG. No indication for further ischemia evaluation at this time.  - Continue heart healthy diet aiming to limit processed food, saturated fat, sugar, and other simple carbohydrates - Aim to be as physically active every day as possible and to achieve at least 150 minutes of moderate intensity exercise each week  Irritable Bowel Syndrome (IBS)  Diverticulosis Lactose intolerance   Diagnosed with IBS in 2006, she had GI intolerance to Repatha . No acute concerns today.   - Management per GI/PCP        Dispo: 6 months with Dr. Mona or me  Signed, Rosaline Bane, NP-C "

## 2024-09-26 ENCOUNTER — Ambulatory Visit (HOSPITAL_BASED_OUTPATIENT_CLINIC_OR_DEPARTMENT_OTHER): Admitting: Nurse Practitioner

## 2024-09-26 ENCOUNTER — Encounter (HOSPITAL_BASED_OUTPATIENT_CLINIC_OR_DEPARTMENT_OTHER): Payer: Self-pay | Admitting: Nurse Practitioner

## 2024-09-26 VITALS — BP 130/64 | HR 84 | Resp 98 | Ht 61.5 in | Wt 147.0 lb

## 2024-09-26 DIAGNOSIS — R9431 Abnormal electrocardiogram [ECG] [EKG]: Secondary | ICD-10-CM

## 2024-09-26 DIAGNOSIS — K580 Irritable bowel syndrome with diarrhea: Secondary | ICD-10-CM

## 2024-09-26 DIAGNOSIS — K5790 Diverticulosis of intestine, part unspecified, without perforation or abscess without bleeding: Secondary | ICD-10-CM

## 2024-09-26 DIAGNOSIS — E739 Lactose intolerance, unspecified: Secondary | ICD-10-CM | POA: Diagnosis not present

## 2024-09-26 DIAGNOSIS — E7841 Elevated Lipoprotein(a): Secondary | ICD-10-CM | POA: Diagnosis not present

## 2024-09-26 DIAGNOSIS — I700 Atherosclerosis of aorta: Secondary | ICD-10-CM

## 2024-09-26 DIAGNOSIS — E785 Hyperlipidemia, unspecified: Secondary | ICD-10-CM

## 2024-09-26 DIAGNOSIS — I2510 Atherosclerotic heart disease of native coronary artery without angina pectoris: Secondary | ICD-10-CM

## 2024-09-26 DIAGNOSIS — Z79899 Other long term (current) drug therapy: Secondary | ICD-10-CM

## 2024-09-26 DIAGNOSIS — R002 Palpitations: Secondary | ICD-10-CM

## 2024-09-26 MED ORDER — NEXLETOL 180 MG PO TABS: 1.0000 | ORAL_TABLET | Freq: Every day | ORAL | 3 refills | Status: AC

## 2024-09-26 NOTE — Patient Instructions (Signed)
 Medication Instructions:  Your physician recommends that you continue on your current medications as directed. Please refer to the Current Medication list given to you today.   *If you need a refill on your cardiac medications before your next appointment, please call your pharmacy*  Lab Work: Your physician recommends that you return for lab work in: 6 months about a week prior to follow up    Lipid panel and ApoB  If you have labs (blood work) drawn today and your tests are completely normal, you will receive your results only by: MyChart Message (if you have MyChart) OR A paper copy in the mail If you have any lab test that is abnormal or we need to change your treatment, we will call you to review the results.  Testing/Procedures: NONE   Follow-Up: At [facility], you and your health needs are our priority.  As part of our continuing mission to provide you with exceptional heart care, our providers are all part of one team.  This team includes your primary Cardiologist (physician) and Advanced Practice Providers or APPs (Physician Assistants and Nurse Practitioners) who all work together to provide you with the care you need, when you need it.  Your next appointment:   6 month(s)  Provider:   Rosaline Bane, NP in Lipid Clinic  We recommend signing up for the patient portal called MyChart.  Sign up information is provided on this After Visit Summary.  MyChart is used to connect with patients for Virtual Visits (Telemedicine).  Patients are able to view lab/test results, encounter notes, upcoming appointments, etc.  Non-urgent messages can be sent to your provider as well.   To learn more about what you can do with MyChart, go to [email or web address].

## 2024-10-16 ENCOUNTER — Ambulatory Visit: Admitting: Gastroenterology

## 2024-10-17 ENCOUNTER — Ambulatory Visit (INDEPENDENT_AMBULATORY_CARE_PROVIDER_SITE_OTHER): Admitting: Adult Health

## 2024-11-14 ENCOUNTER — Ambulatory Visit (INDEPENDENT_AMBULATORY_CARE_PROVIDER_SITE_OTHER): Admitting: Adult Health

## 2025-02-08 ENCOUNTER — Encounter: Admitting: Physician Assistant
